# Patient Record
Sex: Male | Born: 1940
Health system: Southern US, Community
[De-identification: ages and names within clinical notes are randomized; demographics above are authoritative.]

## PROBLEM LIST (undated history)

## (undated) DIAGNOSIS — R112 Nausea with vomiting, unspecified: Secondary | ICD-10-CM

## (undated) DIAGNOSIS — I639 Cerebral infarction, unspecified: Secondary | ICD-10-CM

## (undated) DIAGNOSIS — I739 Peripheral vascular disease, unspecified: Secondary | ICD-10-CM

## (undated) DIAGNOSIS — C801 Malignant (primary) neoplasm, unspecified: Secondary | ICD-10-CM

## (undated) DIAGNOSIS — I219 Acute myocardial infarction, unspecified: Secondary | ICD-10-CM

## (undated) DIAGNOSIS — D649 Anemia, unspecified: Secondary | ICD-10-CM

## (undated) DIAGNOSIS — I2510 Atherosclerotic heart disease of native coronary artery without angina pectoris: Secondary | ICD-10-CM

## (undated) DIAGNOSIS — R053 Chronic cough: Secondary | ICD-10-CM

## (undated) DIAGNOSIS — E039 Hypothyroidism, unspecified: Secondary | ICD-10-CM

## (undated) DIAGNOSIS — I700 Atherosclerosis of aorta: Secondary | ICD-10-CM

## (undated) DIAGNOSIS — T8859XA Other complications of anesthesia, initial encounter: Secondary | ICD-10-CM

## (undated) DIAGNOSIS — I10 Essential (primary) hypertension: Secondary | ICD-10-CM

## (undated) DIAGNOSIS — R0600 Dyspnea, unspecified: Secondary | ICD-10-CM

## (undated) DIAGNOSIS — I714 Abdominal aortic aneurysm, without rupture, unspecified: Secondary | ICD-10-CM

## (undated) DIAGNOSIS — K5790 Diverticulosis of intestine, part unspecified, without perforation or abscess without bleeding: Secondary | ICD-10-CM

## (undated) DIAGNOSIS — K219 Gastro-esophageal reflux disease without esophagitis: Secondary | ICD-10-CM

## (undated) DIAGNOSIS — F419 Anxiety disorder, unspecified: Secondary | ICD-10-CM

## (undated) DIAGNOSIS — Z9889 Other specified postprocedural states: Secondary | ICD-10-CM

## (undated) DIAGNOSIS — G459 Transient cerebral ischemic attack, unspecified: Secondary | ICD-10-CM

## (undated) DIAGNOSIS — G4730 Sleep apnea, unspecified: Secondary | ICD-10-CM

## (undated) DIAGNOSIS — T4145XA Adverse effect of unspecified anesthetic, initial encounter: Secondary | ICD-10-CM

## (undated) DIAGNOSIS — M1990 Unspecified osteoarthritis, unspecified site: Secondary | ICD-10-CM

## (undated) DIAGNOSIS — J449 Chronic obstructive pulmonary disease, unspecified: Secondary | ICD-10-CM

## (undated) DIAGNOSIS — M4800 Spinal stenosis, site unspecified: Secondary | ICD-10-CM

## (undated) DIAGNOSIS — E7800 Pure hypercholesterolemia, unspecified: Secondary | ICD-10-CM

## (undated) DIAGNOSIS — M109 Gout, unspecified: Secondary | ICD-10-CM

## (undated) HISTORY — PX: BACK SURGERY: SHX140

## (undated) HISTORY — PX: CARDIAC CATHETERIZATION: SHX172

## (undated) HISTORY — PX: ABDOMINAL AORTIC ANEURYSM REPAIR: SUR1152

## (undated) HISTORY — PX: OTHER SURGICAL HISTORY: SHX169

## (undated) HISTORY — PX: TESTICLAR CYST EXCISION: SHX2492

## (undated) HISTORY — PX: EYE SURGERY: SHX253

---
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
## 1991-04-11 DIAGNOSIS — I219 Acute myocardial infarction, unspecified: Secondary | ICD-10-CM

## 1991-04-11 HISTORY — PX: CORONARY ARTERY BYPASS GRAFT: SHX141

## 1991-04-11 HISTORY — DX: Acute myocardial infarction, unspecified: I21.9

## 1991-09-09 DIAGNOSIS — Z951 Presence of aortocoronary bypass graft: Secondary | ICD-10-CM

## 1991-09-09 HISTORY — DX: Presence of aortocoronary bypass graft: Z95.1

## 1998-04-10 HISTORY — PX: BACK SURGERY: SHX140

## 2000-03-06 ENCOUNTER — Encounter: Payer: Self-pay | Admitting: Neurosurgery

## 2000-03-07 ENCOUNTER — Encounter: Payer: Self-pay | Admitting: Neurosurgery

## 2000-03-08 ENCOUNTER — Inpatient Hospital Stay (HOSPITAL_COMMUNITY): Admission: RE | Admit: 2000-03-08 | Discharge: 2000-03-09 | Payer: Self-pay | Admitting: Neurosurgery

## 2001-12-31 ENCOUNTER — Encounter: Payer: Self-pay | Admitting: Emergency Medicine

## 2001-12-31 ENCOUNTER — Inpatient Hospital Stay (HOSPITAL_COMMUNITY): Admission: EM | Admit: 2001-12-31 | Discharge: 2002-01-01 | Payer: Self-pay

## 2002-02-21 ENCOUNTER — Encounter: Payer: Self-pay | Admitting: Unknown Physician Specialty

## 2002-02-21 ENCOUNTER — Encounter: Admission: RE | Admit: 2002-02-21 | Discharge: 2002-02-21 | Payer: Self-pay | Admitting: *Deleted

## 2004-08-25 ENCOUNTER — Ambulatory Visit: Payer: Self-pay | Admitting: Unknown Physician Specialty

## 2004-11-17 ENCOUNTER — Other Ambulatory Visit: Payer: Self-pay

## 2004-11-17 ENCOUNTER — Emergency Department: Payer: Self-pay | Admitting: Unknown Physician Specialty

## 2006-06-17 ENCOUNTER — Other Ambulatory Visit: Payer: Self-pay

## 2006-06-17 ENCOUNTER — Emergency Department: Payer: Self-pay | Admitting: Emergency Medicine

## 2008-04-10 HISTORY — PX: ABDOMINAL AORTIC ANEURYSM REPAIR: SUR1152

## 2008-11-13 ENCOUNTER — Other Ambulatory Visit: Payer: Self-pay | Admitting: Unknown Physician Specialty

## 2009-01-07 ENCOUNTER — Ambulatory Visit: Payer: Self-pay | Admitting: Unknown Physician Specialty

## 2009-09-15 ENCOUNTER — Ambulatory Visit: Payer: Self-pay | Admitting: Urology

## 2009-09-29 ENCOUNTER — Ambulatory Visit: Payer: Self-pay | Admitting: Urology

## 2009-11-10 ENCOUNTER — Ambulatory Visit: Payer: Self-pay | Admitting: Urology

## 2009-12-22 ENCOUNTER — Ambulatory Visit: Payer: Self-pay | Admitting: Urology

## 2010-04-30 ENCOUNTER — Encounter: Payer: Self-pay | Admitting: Psychiatry

## 2010-05-11 ENCOUNTER — Ambulatory Visit: Payer: Self-pay | Admitting: Urology

## 2010-05-13 LAB — PATHOLOGY REPORT

## 2010-10-18 ENCOUNTER — Ambulatory Visit: Payer: Self-pay | Admitting: Otolaryngology

## 2010-10-26 ENCOUNTER — Ambulatory Visit: Payer: Self-pay | Admitting: Otolaryngology

## 2010-10-27 LAB — PATHOLOGY REPORT

## 2010-10-28 ENCOUNTER — Ambulatory Visit: Payer: Self-pay | Admitting: Otolaryngology

## 2010-11-02 ENCOUNTER — Ambulatory Visit: Payer: Self-pay | Admitting: Radiation Oncology

## 2010-11-09 ENCOUNTER — Ambulatory Visit: Payer: Self-pay | Admitting: Radiation Oncology

## 2010-12-10 ENCOUNTER — Ambulatory Visit: Payer: Self-pay | Admitting: Radiation Oncology

## 2011-01-09 ENCOUNTER — Ambulatory Visit: Payer: Self-pay | Admitting: Radiation Oncology

## 2011-01-19 ENCOUNTER — Emergency Department: Payer: Self-pay | Admitting: Emergency Medicine

## 2011-02-09 ENCOUNTER — Ambulatory Visit: Payer: Self-pay | Admitting: Radiation Oncology

## 2011-03-11 ENCOUNTER — Ambulatory Visit: Payer: Self-pay | Admitting: Radiation Oncology

## 2011-04-13 ENCOUNTER — Ambulatory Visit: Payer: Self-pay | Admitting: Radiation Oncology

## 2011-05-10 ENCOUNTER — Encounter: Payer: Self-pay | Admitting: Otolaryngology

## 2011-05-12 ENCOUNTER — Ambulatory Visit: Payer: Self-pay | Admitting: Radiation Oncology

## 2011-05-12 ENCOUNTER — Encounter: Payer: Self-pay | Admitting: Otolaryngology

## 2011-06-13 ENCOUNTER — Ambulatory Visit: Payer: Self-pay | Admitting: Radiation Oncology

## 2011-07-10 ENCOUNTER — Ambulatory Visit: Payer: Self-pay | Admitting: Radiation Oncology

## 2011-08-10 ENCOUNTER — Ambulatory Visit: Payer: Self-pay | Admitting: Radiation Oncology

## 2011-09-09 ENCOUNTER — Ambulatory Visit: Payer: Self-pay | Admitting: Radiation Oncology

## 2011-11-22 ENCOUNTER — Ambulatory Visit: Payer: Self-pay | Admitting: Internal Medicine

## 2011-12-06 ENCOUNTER — Ambulatory Visit: Payer: Self-pay | Admitting: Internal Medicine

## 2011-12-21 ENCOUNTER — Ambulatory Visit: Payer: Self-pay | Admitting: Radiation Oncology

## 2012-01-09 ENCOUNTER — Ambulatory Visit: Payer: Self-pay | Admitting: Radiation Oncology

## 2012-04-29 ENCOUNTER — Ambulatory Visit: Payer: Self-pay | Admitting: Unknown Physician Specialty

## 2012-06-19 ENCOUNTER — Ambulatory Visit: Payer: Self-pay | Admitting: Radiation Oncology

## 2012-07-09 ENCOUNTER — Ambulatory Visit: Payer: Self-pay | Admitting: Radiation Oncology

## 2013-04-10 DIAGNOSIS — C801 Malignant (primary) neoplasm, unspecified: Secondary | ICD-10-CM

## 2013-04-10 DIAGNOSIS — Z8521 Personal history of malignant neoplasm of larynx: Secondary | ICD-10-CM

## 2013-04-10 HISTORY — DX: Personal history of malignant neoplasm of larynx: Z85.21

## 2013-04-10 HISTORY — PX: LARYNGOSCOPY: SHX5203

## 2013-04-10 HISTORY — DX: Malignant (primary) neoplasm, unspecified: C80.1

## 2013-06-19 ENCOUNTER — Ambulatory Visit: Payer: Self-pay | Admitting: Radiation Oncology

## 2013-07-09 ENCOUNTER — Ambulatory Visit: Payer: Self-pay | Admitting: Radiation Oncology

## 2013-08-19 ENCOUNTER — Ambulatory Visit: Payer: Self-pay | Admitting: Physical Medicine and Rehabilitation

## 2013-08-25 DIAGNOSIS — M48062 Spinal stenosis, lumbar region with neurogenic claudication: Secondary | ICD-10-CM | POA: Insufficient documentation

## 2013-08-25 DIAGNOSIS — M5416 Radiculopathy, lumbar region: Secondary | ICD-10-CM | POA: Insufficient documentation

## 2013-08-25 DIAGNOSIS — M47816 Spondylosis without myelopathy or radiculopathy, lumbar region: Secondary | ICD-10-CM | POA: Insufficient documentation

## 2014-07-08 ENCOUNTER — Ambulatory Visit
Admit: 2014-07-08 | Disposition: A | Discharge status: Home / Self Care | Payer: Self-pay | Attending: Radiation Oncology | Admitting: Radiation Oncology

## 2015-03-07 DIAGNOSIS — D509 Iron deficiency anemia, unspecified: Secondary | ICD-10-CM | POA: Insufficient documentation

## 2015-07-29 DIAGNOSIS — I10 Essential (primary) hypertension: Secondary | ICD-10-CM | POA: Diagnosis not present

## 2015-07-29 DIAGNOSIS — E119 Type 2 diabetes mellitus without complications: Secondary | ICD-10-CM | POA: Diagnosis not present

## 2015-07-29 DIAGNOSIS — E538 Deficiency of other specified B group vitamins: Secondary | ICD-10-CM | POA: Diagnosis not present

## 2015-07-29 DIAGNOSIS — I739 Peripheral vascular disease, unspecified: Secondary | ICD-10-CM | POA: Diagnosis not present

## 2015-07-29 DIAGNOSIS — Z125 Encounter for screening for malignant neoplasm of prostate: Secondary | ICD-10-CM | POA: Diagnosis not present

## 2015-07-30 DIAGNOSIS — Z125 Encounter for screening for malignant neoplasm of prostate: Secondary | ICD-10-CM | POA: Diagnosis not present

## 2015-07-30 DIAGNOSIS — E538 Deficiency of other specified B group vitamins: Secondary | ICD-10-CM | POA: Diagnosis not present

## 2015-07-30 DIAGNOSIS — I10 Essential (primary) hypertension: Secondary | ICD-10-CM | POA: Diagnosis not present

## 2015-07-30 DIAGNOSIS — E119 Type 2 diabetes mellitus without complications: Secondary | ICD-10-CM | POA: Diagnosis not present

## 2015-07-30 DIAGNOSIS — I739 Peripheral vascular disease, unspecified: Secondary | ICD-10-CM | POA: Diagnosis not present

## 2015-08-02 DIAGNOSIS — R490 Dysphonia: Secondary | ICD-10-CM | POA: Diagnosis not present

## 2015-08-02 DIAGNOSIS — Z8521 Personal history of malignant neoplasm of larynx: Secondary | ICD-10-CM | POA: Diagnosis not present

## 2015-08-02 DIAGNOSIS — I890 Lymphedema, not elsewhere classified: Secondary | ICD-10-CM | POA: Diagnosis not present

## 2015-08-05 DIAGNOSIS — Z8521 Personal history of malignant neoplasm of larynx: Secondary | ICD-10-CM | POA: Insufficient documentation

## 2015-08-05 DIAGNOSIS — I739 Peripheral vascular disease, unspecified: Secondary | ICD-10-CM | POA: Diagnosis not present

## 2015-08-05 DIAGNOSIS — D508 Other iron deficiency anemias: Secondary | ICD-10-CM | POA: Diagnosis not present

## 2015-08-05 DIAGNOSIS — M47816 Spondylosis without myelopathy or radiculopathy, lumbar region: Secondary | ICD-10-CM | POA: Diagnosis not present

## 2015-08-05 DIAGNOSIS — I25710 Atherosclerosis of autologous vein coronary artery bypass graft(s) with unstable angina pectoris: Secondary | ICD-10-CM | POA: Diagnosis not present

## 2015-08-05 DIAGNOSIS — J439 Emphysema, unspecified: Secondary | ICD-10-CM | POA: Diagnosis not present

## 2015-08-05 DIAGNOSIS — G4733 Obstructive sleep apnea (adult) (pediatric): Secondary | ICD-10-CM | POA: Diagnosis not present

## 2015-08-05 DIAGNOSIS — E782 Mixed hyperlipidemia: Secondary | ICD-10-CM | POA: Diagnosis not present

## 2015-08-05 DIAGNOSIS — E119 Type 2 diabetes mellitus without complications: Secondary | ICD-10-CM | POA: Diagnosis not present

## 2015-08-05 DIAGNOSIS — M109 Gout, unspecified: Secondary | ICD-10-CM | POA: Diagnosis not present

## 2015-08-05 DIAGNOSIS — I10 Essential (primary) hypertension: Secondary | ICD-10-CM | POA: Diagnosis not present

## 2015-08-05 DIAGNOSIS — E538 Deficiency of other specified B group vitamins: Secondary | ICD-10-CM | POA: Diagnosis not present

## 2015-08-09 DIAGNOSIS — M898X9 Other specified disorders of bone, unspecified site: Secondary | ICD-10-CM | POA: Diagnosis not present

## 2015-08-09 DIAGNOSIS — B351 Tinea unguium: Secondary | ICD-10-CM | POA: Diagnosis not present

## 2015-08-09 DIAGNOSIS — E119 Type 2 diabetes mellitus without complications: Secondary | ICD-10-CM | POA: Diagnosis not present

## 2015-08-30 DIAGNOSIS — M5136 Other intervertebral disc degeneration, lumbar region: Secondary | ICD-10-CM | POA: Diagnosis not present

## 2015-08-30 DIAGNOSIS — M5416 Radiculopathy, lumbar region: Secondary | ICD-10-CM | POA: Diagnosis not present

## 2015-08-30 DIAGNOSIS — M4806 Spinal stenosis, lumbar region: Secondary | ICD-10-CM | POA: Diagnosis not present

## 2015-09-14 DIAGNOSIS — G4733 Obstructive sleep apnea (adult) (pediatric): Secondary | ICD-10-CM | POA: Diagnosis not present

## 2015-10-04 DIAGNOSIS — M5415 Radiculopathy, thoracolumbar region: Secondary | ICD-10-CM | POA: Diagnosis not present

## 2015-10-04 DIAGNOSIS — M5414 Radiculopathy, thoracic region: Secondary | ICD-10-CM | POA: Diagnosis not present

## 2015-10-04 DIAGNOSIS — M9902 Segmental and somatic dysfunction of thoracic region: Secondary | ICD-10-CM | POA: Diagnosis not present

## 2015-10-04 DIAGNOSIS — M546 Pain in thoracic spine: Secondary | ICD-10-CM | POA: Diagnosis not present

## 2015-10-04 DIAGNOSIS — M9908 Segmental and somatic dysfunction of rib cage: Secondary | ICD-10-CM | POA: Diagnosis not present

## 2015-10-04 DIAGNOSIS — M791 Myalgia: Secondary | ICD-10-CM | POA: Diagnosis not present

## 2015-10-06 DIAGNOSIS — M546 Pain in thoracic spine: Secondary | ICD-10-CM | POA: Diagnosis not present

## 2015-10-06 DIAGNOSIS — M9902 Segmental and somatic dysfunction of thoracic region: Secondary | ICD-10-CM | POA: Diagnosis not present

## 2015-10-06 DIAGNOSIS — M5415 Radiculopathy, thoracolumbar region: Secondary | ICD-10-CM | POA: Diagnosis not present

## 2015-10-06 DIAGNOSIS — M9908 Segmental and somatic dysfunction of rib cage: Secondary | ICD-10-CM | POA: Diagnosis not present

## 2015-10-06 DIAGNOSIS — M5414 Radiculopathy, thoracic region: Secondary | ICD-10-CM | POA: Diagnosis not present

## 2015-10-06 DIAGNOSIS — M791 Myalgia: Secondary | ICD-10-CM | POA: Diagnosis not present

## 2015-11-04 DIAGNOSIS — I719 Aortic aneurysm of unspecified site, without rupture: Secondary | ICD-10-CM | POA: Insufficient documentation

## 2015-11-11 DIAGNOSIS — M5417 Radiculopathy, lumbosacral region: Secondary | ICD-10-CM | POA: Diagnosis not present

## 2015-11-11 DIAGNOSIS — M25551 Pain in right hip: Secondary | ICD-10-CM | POA: Diagnosis not present

## 2015-11-11 DIAGNOSIS — M9904 Segmental and somatic dysfunction of sacral region: Secondary | ICD-10-CM | POA: Diagnosis not present

## 2015-11-11 DIAGNOSIS — M5416 Radiculopathy, lumbar region: Secondary | ICD-10-CM | POA: Diagnosis not present

## 2015-11-11 DIAGNOSIS — M9905 Segmental and somatic dysfunction of pelvic region: Secondary | ICD-10-CM | POA: Diagnosis not present

## 2015-11-11 DIAGNOSIS — M5136 Other intervertebral disc degeneration, lumbar region: Secondary | ICD-10-CM | POA: Diagnosis not present

## 2015-11-11 DIAGNOSIS — M5137 Other intervertebral disc degeneration, lumbosacral region: Secondary | ICD-10-CM | POA: Diagnosis not present

## 2015-11-11 DIAGNOSIS — M791 Myalgia: Secondary | ICD-10-CM | POA: Diagnosis not present

## 2015-11-11 DIAGNOSIS — M9903 Segmental and somatic dysfunction of lumbar region: Secondary | ICD-10-CM | POA: Diagnosis not present

## 2015-11-11 DIAGNOSIS — M7611 Psoas tendinitis, right hip: Secondary | ICD-10-CM | POA: Diagnosis not present

## 2015-11-12 DIAGNOSIS — M25551 Pain in right hip: Secondary | ICD-10-CM | POA: Diagnosis not present

## 2015-11-12 DIAGNOSIS — M5416 Radiculopathy, lumbar region: Secondary | ICD-10-CM | POA: Diagnosis not present

## 2015-11-12 DIAGNOSIS — M9904 Segmental and somatic dysfunction of sacral region: Secondary | ICD-10-CM | POA: Diagnosis not present

## 2015-11-12 DIAGNOSIS — M9905 Segmental and somatic dysfunction of pelvic region: Secondary | ICD-10-CM | POA: Diagnosis not present

## 2015-11-12 DIAGNOSIS — M791 Myalgia: Secondary | ICD-10-CM | POA: Diagnosis not present

## 2015-11-12 DIAGNOSIS — M5417 Radiculopathy, lumbosacral region: Secondary | ICD-10-CM | POA: Diagnosis not present

## 2015-11-12 DIAGNOSIS — M9903 Segmental and somatic dysfunction of lumbar region: Secondary | ICD-10-CM | POA: Diagnosis not present

## 2015-11-12 DIAGNOSIS — M7611 Psoas tendinitis, right hip: Secondary | ICD-10-CM | POA: Diagnosis not present

## 2015-11-12 DIAGNOSIS — M5136 Other intervertebral disc degeneration, lumbar region: Secondary | ICD-10-CM | POA: Diagnosis not present

## 2015-11-12 DIAGNOSIS — M5137 Other intervertebral disc degeneration, lumbosacral region: Secondary | ICD-10-CM | POA: Diagnosis not present

## 2015-11-30 DIAGNOSIS — M9903 Segmental and somatic dysfunction of lumbar region: Secondary | ICD-10-CM | POA: Diagnosis not present

## 2015-11-30 DIAGNOSIS — M5136 Other intervertebral disc degeneration, lumbar region: Secondary | ICD-10-CM | POA: Diagnosis not present

## 2015-11-30 DIAGNOSIS — M9905 Segmental and somatic dysfunction of pelvic region: Secondary | ICD-10-CM | POA: Diagnosis not present

## 2015-11-30 DIAGNOSIS — M5416 Radiculopathy, lumbar region: Secondary | ICD-10-CM | POA: Diagnosis not present

## 2015-11-30 DIAGNOSIS — M791 Myalgia: Secondary | ICD-10-CM | POA: Diagnosis not present

## 2015-11-30 DIAGNOSIS — M25551 Pain in right hip: Secondary | ICD-10-CM | POA: Diagnosis not present

## 2015-11-30 DIAGNOSIS — M7611 Psoas tendinitis, right hip: Secondary | ICD-10-CM | POA: Diagnosis not present

## 2015-11-30 DIAGNOSIS — M9904 Segmental and somatic dysfunction of sacral region: Secondary | ICD-10-CM | POA: Diagnosis not present

## 2015-11-30 DIAGNOSIS — M5137 Other intervertebral disc degeneration, lumbosacral region: Secondary | ICD-10-CM | POA: Diagnosis not present

## 2015-11-30 DIAGNOSIS — M5417 Radiculopathy, lumbosacral region: Secondary | ICD-10-CM | POA: Diagnosis not present

## 2015-12-02 DIAGNOSIS — M5136 Other intervertebral disc degeneration, lumbar region: Secondary | ICD-10-CM | POA: Diagnosis not present

## 2015-12-02 DIAGNOSIS — M9903 Segmental and somatic dysfunction of lumbar region: Secondary | ICD-10-CM | POA: Diagnosis not present

## 2015-12-02 DIAGNOSIS — M5137 Other intervertebral disc degeneration, lumbosacral region: Secondary | ICD-10-CM | POA: Diagnosis not present

## 2015-12-02 DIAGNOSIS — M791 Myalgia: Secondary | ICD-10-CM | POA: Diagnosis not present

## 2015-12-02 DIAGNOSIS — M5416 Radiculopathy, lumbar region: Secondary | ICD-10-CM | POA: Diagnosis not present

## 2015-12-02 DIAGNOSIS — M5417 Radiculopathy, lumbosacral region: Secondary | ICD-10-CM | POA: Diagnosis not present

## 2015-12-02 DIAGNOSIS — M9905 Segmental and somatic dysfunction of pelvic region: Secondary | ICD-10-CM | POA: Diagnosis not present

## 2015-12-02 DIAGNOSIS — M7611 Psoas tendinitis, right hip: Secondary | ICD-10-CM | POA: Diagnosis not present

## 2015-12-02 DIAGNOSIS — M9904 Segmental and somatic dysfunction of sacral region: Secondary | ICD-10-CM | POA: Diagnosis not present

## 2015-12-02 DIAGNOSIS — M25551 Pain in right hip: Secondary | ICD-10-CM | POA: Diagnosis not present

## 2015-12-06 DIAGNOSIS — M4806 Spinal stenosis, lumbar region: Secondary | ICD-10-CM | POA: Diagnosis not present

## 2015-12-06 DIAGNOSIS — M5136 Other intervertebral disc degeneration, lumbar region: Secondary | ICD-10-CM | POA: Diagnosis not present

## 2015-12-06 DIAGNOSIS — M5416 Radiculopathy, lumbar region: Secondary | ICD-10-CM | POA: Diagnosis not present

## 2015-12-06 DIAGNOSIS — M1611 Unilateral primary osteoarthritis, right hip: Secondary | ICD-10-CM | POA: Diagnosis not present

## 2015-12-24 DIAGNOSIS — M1611 Unilateral primary osteoarthritis, right hip: Secondary | ICD-10-CM | POA: Diagnosis not present

## 2015-12-27 DIAGNOSIS — G4733 Obstructive sleep apnea (adult) (pediatric): Secondary | ICD-10-CM | POA: Diagnosis not present

## 2016-01-10 DIAGNOSIS — Z09 Encounter for follow-up examination after completed treatment for conditions other than malignant neoplasm: Secondary | ICD-10-CM | POA: Diagnosis not present

## 2016-01-10 DIAGNOSIS — Z87891 Personal history of nicotine dependence: Secondary | ICD-10-CM | POA: Diagnosis not present

## 2016-01-10 DIAGNOSIS — I2510 Atherosclerotic heart disease of native coronary artery without angina pectoris: Secondary | ICD-10-CM | POA: Diagnosis not present

## 2016-01-10 DIAGNOSIS — I714 Abdominal aortic aneurysm, without rupture: Secondary | ICD-10-CM | POA: Diagnosis not present

## 2016-01-10 DIAGNOSIS — I10 Essential (primary) hypertension: Secondary | ICD-10-CM | POA: Diagnosis not present

## 2016-01-10 DIAGNOSIS — E785 Hyperlipidemia, unspecified: Secondary | ICD-10-CM | POA: Diagnosis not present

## 2016-01-10 DIAGNOSIS — I252 Old myocardial infarction: Secondary | ICD-10-CM | POA: Diagnosis not present

## 2016-01-18 DIAGNOSIS — H2513 Age-related nuclear cataract, bilateral: Secondary | ICD-10-CM | POA: Diagnosis not present

## 2016-01-31 DIAGNOSIS — M1611 Unilateral primary osteoarthritis, right hip: Secondary | ICD-10-CM | POA: Diagnosis not present

## 2016-01-31 DIAGNOSIS — M5416 Radiculopathy, lumbar region: Secondary | ICD-10-CM | POA: Diagnosis not present

## 2016-01-31 DIAGNOSIS — M48062 Spinal stenosis, lumbar region with neurogenic claudication: Secondary | ICD-10-CM | POA: Diagnosis not present

## 2016-01-31 DIAGNOSIS — M5136 Other intervertebral disc degeneration, lumbar region: Secondary | ICD-10-CM | POA: Diagnosis not present

## 2016-02-01 DIAGNOSIS — E119 Type 2 diabetes mellitus without complications: Secondary | ICD-10-CM | POA: Diagnosis not present

## 2016-02-01 DIAGNOSIS — M109 Gout, unspecified: Secondary | ICD-10-CM | POA: Diagnosis not present

## 2016-02-01 DIAGNOSIS — J439 Emphysema, unspecified: Secondary | ICD-10-CM | POA: Diagnosis not present

## 2016-02-01 DIAGNOSIS — I25710 Atherosclerosis of autologous vein coronary artery bypass graft(s) with unstable angina pectoris: Secondary | ICD-10-CM | POA: Diagnosis not present

## 2016-02-01 DIAGNOSIS — I10 Essential (primary) hypertension: Secondary | ICD-10-CM | POA: Diagnosis not present

## 2016-02-02 DIAGNOSIS — E119 Type 2 diabetes mellitus without complications: Secondary | ICD-10-CM | POA: Diagnosis not present

## 2016-02-02 DIAGNOSIS — M109 Gout, unspecified: Secondary | ICD-10-CM | POA: Diagnosis not present

## 2016-02-02 DIAGNOSIS — I25710 Atherosclerosis of autologous vein coronary artery bypass graft(s) with unstable angina pectoris: Secondary | ICD-10-CM | POA: Diagnosis not present

## 2016-02-02 DIAGNOSIS — J439 Emphysema, unspecified: Secondary | ICD-10-CM | POA: Diagnosis not present

## 2016-02-02 DIAGNOSIS — I10 Essential (primary) hypertension: Secondary | ICD-10-CM | POA: Diagnosis not present

## 2016-02-07 DIAGNOSIS — I714 Abdominal aortic aneurysm, without rupture, unspecified: Secondary | ICD-10-CM | POA: Insufficient documentation

## 2016-02-08 DIAGNOSIS — M48062 Spinal stenosis, lumbar region with neurogenic claudication: Secondary | ICD-10-CM | POA: Diagnosis not present

## 2016-02-08 DIAGNOSIS — I25710 Atherosclerosis of autologous vein coronary artery bypass graft(s) with unstable angina pectoris: Secondary | ICD-10-CM | POA: Diagnosis not present

## 2016-02-08 DIAGNOSIS — G4733 Obstructive sleep apnea (adult) (pediatric): Secondary | ICD-10-CM | POA: Diagnosis not present

## 2016-02-08 DIAGNOSIS — E119 Type 2 diabetes mellitus without complications: Secondary | ICD-10-CM | POA: Diagnosis not present

## 2016-02-08 DIAGNOSIS — E782 Mixed hyperlipidemia: Secondary | ICD-10-CM | POA: Diagnosis not present

## 2016-02-08 DIAGNOSIS — I739 Peripheral vascular disease, unspecified: Secondary | ICD-10-CM | POA: Diagnosis not present

## 2016-02-08 DIAGNOSIS — D692 Other nonthrombocytopenic purpura: Secondary | ICD-10-CM | POA: Insufficient documentation

## 2016-02-08 DIAGNOSIS — M109 Gout, unspecified: Secondary | ICD-10-CM | POA: Diagnosis not present

## 2016-02-08 DIAGNOSIS — E538 Deficiency of other specified B group vitamins: Secondary | ICD-10-CM | POA: Diagnosis not present

## 2016-02-08 DIAGNOSIS — I10 Essential (primary) hypertension: Secondary | ICD-10-CM | POA: Diagnosis not present

## 2016-02-08 DIAGNOSIS — Z0001 Encounter for general adult medical examination with abnormal findings: Secondary | ICD-10-CM | POA: Diagnosis not present

## 2016-02-08 DIAGNOSIS — J439 Emphysema, unspecified: Secondary | ICD-10-CM | POA: Diagnosis not present

## 2016-02-08 DIAGNOSIS — Z23 Encounter for immunization: Secondary | ICD-10-CM | POA: Diagnosis not present

## 2016-02-08 DIAGNOSIS — K219 Gastro-esophageal reflux disease without esophagitis: Secondary | ICD-10-CM | POA: Diagnosis not present

## 2016-02-09 DIAGNOSIS — R808 Other proteinuria: Secondary | ICD-10-CM | POA: Diagnosis not present

## 2016-02-17 DIAGNOSIS — M5416 Radiculopathy, lumbar region: Secondary | ICD-10-CM | POA: Diagnosis not present

## 2016-02-17 DIAGNOSIS — M48062 Spinal stenosis, lumbar region with neurogenic claudication: Secondary | ICD-10-CM | POA: Diagnosis not present

## 2016-02-17 DIAGNOSIS — M5136 Other intervertebral disc degeneration, lumbar region: Secondary | ICD-10-CM | POA: Diagnosis not present

## 2016-03-16 DIAGNOSIS — M5416 Radiculopathy, lumbar region: Secondary | ICD-10-CM | POA: Diagnosis not present

## 2016-03-16 DIAGNOSIS — M5136 Other intervertebral disc degeneration, lumbar region: Secondary | ICD-10-CM | POA: Diagnosis not present

## 2016-03-16 DIAGNOSIS — M48062 Spinal stenosis, lumbar region with neurogenic claudication: Secondary | ICD-10-CM | POA: Diagnosis not present

## 2016-03-30 DIAGNOSIS — G4733 Obstructive sleep apnea (adult) (pediatric): Secondary | ICD-10-CM | POA: Diagnosis not present

## 2016-04-07 DIAGNOSIS — G4733 Obstructive sleep apnea (adult) (pediatric): Secondary | ICD-10-CM | POA: Diagnosis not present

## 2016-04-12 DIAGNOSIS — M1611 Unilateral primary osteoarthritis, right hip: Secondary | ICD-10-CM | POA: Diagnosis not present

## 2016-05-04 DIAGNOSIS — E119 Type 2 diabetes mellitus without complications: Secondary | ICD-10-CM | POA: Diagnosis not present

## 2016-05-04 DIAGNOSIS — E538 Deficiency of other specified B group vitamins: Secondary | ICD-10-CM | POA: Diagnosis not present

## 2016-05-04 DIAGNOSIS — E782 Mixed hyperlipidemia: Secondary | ICD-10-CM | POA: Diagnosis not present

## 2016-05-10 DIAGNOSIS — M47816 Spondylosis without myelopathy or radiculopathy, lumbar region: Secondary | ICD-10-CM | POA: Diagnosis not present

## 2016-05-10 DIAGNOSIS — E782 Mixed hyperlipidemia: Secondary | ICD-10-CM | POA: Diagnosis not present

## 2016-05-10 DIAGNOSIS — D692 Other nonthrombocytopenic purpura: Secondary | ICD-10-CM | POA: Diagnosis not present

## 2016-05-10 DIAGNOSIS — M109 Gout, unspecified: Secondary | ICD-10-CM | POA: Diagnosis not present

## 2016-05-10 DIAGNOSIS — I739 Peripheral vascular disease, unspecified: Secondary | ICD-10-CM | POA: Diagnosis not present

## 2016-05-10 DIAGNOSIS — I714 Abdominal aortic aneurysm, without rupture: Secondary | ICD-10-CM | POA: Diagnosis not present

## 2016-05-10 DIAGNOSIS — I10 Essential (primary) hypertension: Secondary | ICD-10-CM | POA: Diagnosis not present

## 2016-05-10 DIAGNOSIS — E119 Type 2 diabetes mellitus without complications: Secondary | ICD-10-CM | POA: Diagnosis not present

## 2016-05-10 DIAGNOSIS — I25710 Atherosclerosis of autologous vein coronary artery bypass graft(s) with unstable angina pectoris: Secondary | ICD-10-CM | POA: Diagnosis not present

## 2016-05-10 DIAGNOSIS — K219 Gastro-esophageal reflux disease without esophagitis: Secondary | ICD-10-CM | POA: Diagnosis not present

## 2016-05-10 DIAGNOSIS — M48062 Spinal stenosis, lumbar region with neurogenic claudication: Secondary | ICD-10-CM | POA: Diagnosis not present

## 2016-05-10 DIAGNOSIS — J439 Emphysema, unspecified: Secondary | ICD-10-CM | POA: Diagnosis not present

## 2016-06-06 DIAGNOSIS — M1611 Unilateral primary osteoarthritis, right hip: Secondary | ICD-10-CM | POA: Diagnosis not present

## 2016-06-06 DIAGNOSIS — M5416 Radiculopathy, lumbar region: Secondary | ICD-10-CM | POA: Diagnosis not present

## 2016-06-06 DIAGNOSIS — M48062 Spinal stenosis, lumbar region with neurogenic claudication: Secondary | ICD-10-CM | POA: Diagnosis not present

## 2016-06-06 DIAGNOSIS — M5136 Other intervertebral disc degeneration, lumbar region: Secondary | ICD-10-CM | POA: Diagnosis not present

## 2016-06-21 DIAGNOSIS — Z8521 Personal history of malignant neoplasm of larynx: Secondary | ICD-10-CM | POA: Diagnosis not present

## 2016-06-21 DIAGNOSIS — Z08 Encounter for follow-up examination after completed treatment for malignant neoplasm: Secondary | ICD-10-CM | POA: Diagnosis not present

## 2016-06-21 DIAGNOSIS — Z87891 Personal history of nicotine dependence: Secondary | ICD-10-CM | POA: Diagnosis not present

## 2016-06-21 DIAGNOSIS — K219 Gastro-esophageal reflux disease without esophagitis: Secondary | ICD-10-CM | POA: Diagnosis not present

## 2016-06-21 DIAGNOSIS — J018 Other acute sinusitis: Secondary | ICD-10-CM | POA: Diagnosis not present

## 2016-07-12 DIAGNOSIS — G4733 Obstructive sleep apnea (adult) (pediatric): Secondary | ICD-10-CM | POA: Diagnosis not present

## 2016-07-20 DIAGNOSIS — J330 Polyp of nasal cavity: Secondary | ICD-10-CM | POA: Diagnosis not present

## 2016-07-20 DIAGNOSIS — Z87891 Personal history of nicotine dependence: Secondary | ICD-10-CM | POA: Diagnosis not present

## 2016-08-03 DIAGNOSIS — M109 Gout, unspecified: Secondary | ICD-10-CM | POA: Diagnosis not present

## 2016-08-03 DIAGNOSIS — E119 Type 2 diabetes mellitus without complications: Secondary | ICD-10-CM | POA: Diagnosis not present

## 2016-08-03 DIAGNOSIS — I10 Essential (primary) hypertension: Secondary | ICD-10-CM | POA: Diagnosis not present

## 2016-08-03 DIAGNOSIS — E538 Deficiency of other specified B group vitamins: Secondary | ICD-10-CM | POA: Diagnosis not present

## 2016-08-08 DIAGNOSIS — M48062 Spinal stenosis, lumbar region with neurogenic claudication: Secondary | ICD-10-CM | POA: Diagnosis not present

## 2016-08-08 DIAGNOSIS — D692 Other nonthrombocytopenic purpura: Secondary | ICD-10-CM | POA: Diagnosis not present

## 2016-08-08 DIAGNOSIS — E782 Mixed hyperlipidemia: Secondary | ICD-10-CM | POA: Diagnosis not present

## 2016-08-08 DIAGNOSIS — I10 Essential (primary) hypertension: Secondary | ICD-10-CM | POA: Diagnosis not present

## 2016-08-08 DIAGNOSIS — Z8521 Personal history of malignant neoplasm of larynx: Secondary | ICD-10-CM | POA: Diagnosis not present

## 2016-08-08 DIAGNOSIS — M109 Gout, unspecified: Secondary | ICD-10-CM | POA: Diagnosis not present

## 2016-08-08 DIAGNOSIS — E538 Deficiency of other specified B group vitamins: Secondary | ICD-10-CM | POA: Diagnosis not present

## 2016-08-08 DIAGNOSIS — I739 Peripheral vascular disease, unspecified: Secondary | ICD-10-CM | POA: Diagnosis not present

## 2016-08-08 DIAGNOSIS — K219 Gastro-esophageal reflux disease without esophagitis: Secondary | ICD-10-CM | POA: Diagnosis not present

## 2016-08-08 DIAGNOSIS — G4733 Obstructive sleep apnea (adult) (pediatric): Secondary | ICD-10-CM | POA: Diagnosis not present

## 2016-08-08 DIAGNOSIS — E119 Type 2 diabetes mellitus without complications: Secondary | ICD-10-CM | POA: Diagnosis not present

## 2016-08-08 DIAGNOSIS — Z0000 Encounter for general adult medical examination without abnormal findings: Secondary | ICD-10-CM | POA: Diagnosis not present

## 2016-10-18 DIAGNOSIS — J018 Other acute sinusitis: Secondary | ICD-10-CM | POA: Diagnosis not present

## 2016-11-06 DIAGNOSIS — H2511 Age-related nuclear cataract, right eye: Secondary | ICD-10-CM | POA: Diagnosis not present

## 2016-11-27 DIAGNOSIS — H2512 Age-related nuclear cataract, left eye: Secondary | ICD-10-CM | POA: Diagnosis not present

## 2016-12-12 ENCOUNTER — Ambulatory Visit
Admission: RE | Admit: 2016-12-12 | Discharge: 2016-12-12 | Disposition: A | Discharge status: Home / Self Care | Payer: PPO | Source: Ambulatory Visit | Attending: Ophthalmology | Admitting: Ophthalmology

## 2016-12-12 ENCOUNTER — Ambulatory Visit: Payer: PPO | Admitting: Anesthesiology

## 2016-12-12 ENCOUNTER — Encounter
Admission: RE | Disposition: A | Discharge status: Home / Self Care | Payer: Self-pay | Source: Ambulatory Visit | Attending: Ophthalmology

## 2016-12-12 ENCOUNTER — Encounter: Payer: Self-pay | Admitting: *Deleted

## 2016-12-12 DIAGNOSIS — Z87891 Personal history of nicotine dependence: Secondary | ICD-10-CM | POA: Diagnosis not present

## 2016-12-12 DIAGNOSIS — R609 Edema, unspecified: Secondary | ICD-10-CM | POA: Diagnosis not present

## 2016-12-12 DIAGNOSIS — E039 Hypothyroidism, unspecified: Secondary | ICD-10-CM | POA: Insufficient documentation

## 2016-12-12 DIAGNOSIS — I252 Old myocardial infarction: Secondary | ICD-10-CM | POA: Diagnosis not present

## 2016-12-12 DIAGNOSIS — Z8673 Personal history of transient ischemic attack (TIA), and cerebral infarction without residual deficits: Secondary | ICD-10-CM | POA: Insufficient documentation

## 2016-12-12 DIAGNOSIS — M109 Gout, unspecified: Secondary | ICD-10-CM | POA: Insufficient documentation

## 2016-12-12 DIAGNOSIS — D649 Anemia, unspecified: Secondary | ICD-10-CM | POA: Insufficient documentation

## 2016-12-12 DIAGNOSIS — Z951 Presence of aortocoronary bypass graft: Secondary | ICD-10-CM | POA: Diagnosis not present

## 2016-12-12 DIAGNOSIS — E7800 Pure hypercholesterolemia, unspecified: Secondary | ICD-10-CM | POA: Diagnosis not present

## 2016-12-12 DIAGNOSIS — I10 Essential (primary) hypertension: Secondary | ICD-10-CM | POA: Insufficient documentation

## 2016-12-12 DIAGNOSIS — Z8589 Personal history of malignant neoplasm of other organs and systems: Secondary | ICD-10-CM | POA: Insufficient documentation

## 2016-12-12 DIAGNOSIS — H2512 Age-related nuclear cataract, left eye: Secondary | ICD-10-CM | POA: Diagnosis not present

## 2016-12-12 DIAGNOSIS — G4730 Sleep apnea, unspecified: Secondary | ICD-10-CM | POA: Diagnosis not present

## 2016-12-12 DIAGNOSIS — K219 Gastro-esophageal reflux disease without esophagitis: Secondary | ICD-10-CM | POA: Diagnosis not present

## 2016-12-12 HISTORY — DX: Malignant (primary) neoplasm, unspecified: C80.1

## 2016-12-12 HISTORY — DX: Acute myocardial infarction, unspecified: I21.9

## 2016-12-12 HISTORY — DX: Transient cerebral ischemic attack, unspecified: G45.9

## 2016-12-12 HISTORY — DX: Essential (primary) hypertension: I10

## 2016-12-12 HISTORY — DX: Gastro-esophageal reflux disease without esophagitis: K21.9

## 2016-12-12 HISTORY — DX: Sleep apnea, unspecified: G47.30

## 2016-12-12 HISTORY — DX: Gout, unspecified: M10.9

## 2016-12-12 HISTORY — PX: CATARACT EXTRACTION W/PHACO: SHX586

## 2016-12-12 HISTORY — DX: Hypothyroidism, unspecified: E03.9

## 2016-12-12 HISTORY — DX: Pure hypercholesterolemia, unspecified: E78.00

## 2016-12-12 HISTORY — DX: Anemia, unspecified: D64.9

## 2016-12-12 SURGERY — PHACOEMULSIFICATION, CATARACT, WITH IOL INSERTION
Anesthesia: Monitor Anesthesia Care | Site: Eye | Laterality: Left | Wound class: Clean

## 2016-12-12 MED ORDER — CARBACHOL 0.01 % IO SOLN
INTRAOCULAR | Status: DC | PRN
  Administered 2016-12-12: 0.5 mL via INTRAOCULAR

## 2016-12-12 MED ORDER — EPINEPHRINE PF 1 MG/ML IJ SOLN
INTRAMUSCULAR | Status: AC
Start: 2016-12-12 — End: ?
  Filled 2016-12-12: qty 2

## 2016-12-12 MED ORDER — FENTANYL CITRATE (PF) 100 MCG/2ML IJ SOLN
INTRAMUSCULAR | Status: DC | PRN
Start: 2016-12-12 — End: 2016-12-12
  Administered 2016-12-12 (×2): 25 ug via INTRAVENOUS

## 2016-12-12 MED ORDER — MOXIFLOXACIN HCL 0.5 % OP SOLN
1.0000 [drp] | OPHTHALMIC | Status: AC | PRN
  Administered 2016-12-12: 1 [drp] via OPHTHALMIC
  Filled 2016-12-12: qty 3

## 2016-12-12 MED ORDER — POVIDONE-IODINE 5 % OP SOLN
OPHTHALMIC | Status: AC
Start: 2016-12-12 — End: ?
  Filled 2016-12-12: qty 30

## 2016-12-12 MED ORDER — MOXIFLOXACIN HCL 0.5 % OP SOLN
OPHTHALMIC | Status: DC | PRN
  Administered 2016-12-12: 0.2 mL via OPHTHALMIC

## 2016-12-12 MED ORDER — POVIDONE-IODINE 5 % OP SOLN
OPHTHALMIC | Status: DC | PRN
  Administered 2016-12-12: 1 via OPHTHALMIC

## 2016-12-12 MED ORDER — MOXIFLOXACIN HCL 0.5 % OP SOLN
OPHTHALMIC | Status: AC
  Filled 2016-12-12: qty 3

## 2016-12-12 MED ORDER — FENTANYL CITRATE (PF) 100 MCG/2ML IJ SOLN
INTRAMUSCULAR | Status: AC
  Filled 2016-12-12: qty 2

## 2016-12-12 MED ORDER — ARMC OPHTHALMIC DILATING DROPS
OPHTHALMIC | Status: AC
  Administered 2016-12-12: 1 via OPHTHALMIC
  Filled 2016-12-12: qty 0.4

## 2016-12-12 MED ORDER — LIDOCAINE HCL (PF) 4 % IJ SOLN
INTRAMUSCULAR | Status: AC
  Filled 2016-12-12: qty 5

## 2016-12-12 MED ORDER — SODIUM CHLORIDE 0.9 % IV SOLN
INTRAVENOUS | Status: DC
  Administered 2016-12-12: 08:00:00 via INTRAVENOUS

## 2016-12-12 MED ORDER — NA CHONDROIT SULF-NA HYALURON 40-17 MG/ML IO SOLN
INTRAOCULAR | Status: DC | PRN
  Administered 2016-12-12: 1 mL via INTRAOCULAR

## 2016-12-12 MED ORDER — EPINEPHRINE PF 1 MG/ML IJ SOLN
INTRAOCULAR | Status: DC | PRN
  Administered 2016-12-12: 09:00:00 via OPHTHALMIC

## 2016-12-12 MED ORDER — NA CHONDROIT SULF-NA HYALURON 40-17 MG/ML IO SOLN
INTRAOCULAR | Status: AC
  Filled 2016-12-12: qty 1

## 2016-12-12 MED ORDER — ARMC OPHTHALMIC DILATING DROPS
1.0000 "application " | OPHTHALMIC | Status: AC | PRN
  Administered 2016-12-12 (×3): 1 via OPHTHALMIC

## 2016-12-12 MED ORDER — LIDOCAINE HCL (PF) 4 % IJ SOLN
INTRAOCULAR | Status: DC | PRN
  Administered 2016-12-12: 4 mL via OPHTHALMIC

## 2016-12-12 SURGICAL SUPPLY — 18 items
CANNULA ANT/CHMB 27G (MISCELLANEOUS) IMPLANT
CANNULA ANT/CHMB 27GA (MISCELLANEOUS) ×2 IMPLANT
GLOVE BIO SURGEON STRL SZ8 (GLOVE) ×2 IMPLANT
GLOVE BIOGEL M 6.5 STRL (GLOVE) ×2 IMPLANT
GLOVE SURG LX 8.0 MICRO (GLOVE) ×1
GLOVE SURG LX STRL 8.0 MICRO (GLOVE) ×1 IMPLANT
GOWN STRL REUS W/ TWL LRG LVL3 (GOWN DISPOSABLE) ×2 IMPLANT
GOWN STRL REUS W/TWL LRG LVL3 (GOWN DISPOSABLE) ×4
LABEL CATARACT MEDS ST (LABEL) ×2 IMPLANT
LENS IOL TECNIS ITEC 13.5 (Intraocular Lens) ×1 IMPLANT
PACK CATARACT (MISCELLANEOUS) ×2 IMPLANT
PACK CATARACT BRASINGTON LX (MISCELLANEOUS) ×2 IMPLANT
PACK EYE AFTER SURG (MISCELLANEOUS) ×2 IMPLANT
SOL BSS BAG (MISCELLANEOUS) ×2
SOLUTION BSS BAG (MISCELLANEOUS) ×1 IMPLANT
SYR 5ML LL (SYRINGE) ×2 IMPLANT
WATER STERILE IRR 250ML POUR (IV SOLUTION) ×2 IMPLANT
WIPE NON LINTING 3.25X3.25 (MISCELLANEOUS) ×2 IMPLANT

## 2016-12-12 NOTE — Anesthesia Post-op Follow-up Note (Signed)
Anesthesia QCDR form completed.        

## 2016-12-12 NOTE — Anesthesia Postprocedure Evaluation (Signed)
Anesthesia Post Note  Patient: Jesse Allison  Procedure(s) Performed: Procedure(s) (LRB): CATARACT EXTRACTION PHACO AND INTRAOCULAR LENS PLACEMENT (IOC) (Left)  Patient location during evaluation: Short Stay Anesthesia Type: MAC Level of consciousness: awake and alert and oriented Pain management: pain level controlled Vital Signs Assessment: post-procedure vital signs reviewed and stable Respiratory status: spontaneous breathing and respiratory function stable Cardiovascular status: stable Postop Assessment: no headache and adequate PO intake Anesthetic complications: no     Last Vitals:  Vitals:   12/12/16 0921 12/12/16 0922  BP: (!) 184/71 (!) 184/71  Pulse: (!) 57 62  Resp: 18 18  Temp: (!) 36.2 C   SpO2: 100% 100%    Last Pain:  Vitals:   12/12/16 0921  TempSrc: Filbert Berthold

## 2016-12-12 NOTE — Anesthesia Preprocedure Evaluation (Signed)
Anesthesia Evaluation  Patient identified by MRN, date of birth, ID band Patient awake    Reviewed: Allergy & Precautions, NPO status , Patient's Chart, lab work & pertinent test results, reviewed documented beta blocker date and time   Airway Mallampati: III  TM Distance: >3 FB     Dental  (+) Chipped   Pulmonary sleep apnea , former smoker,           Cardiovascular hypertension, Pt. on medications + Past MI       Neuro/Psych TIA   GI/Hepatic GERD  Controlled,  Endo/Other  Hypothyroidism   Renal/GU      Musculoskeletal   Abdominal   Peds  Hematology  (+) anemia ,   Anesthesia Other Findings Gout.AAA repair.  Reproductive/Obstetrics                             Anesthesia Physical Anesthesia Plan  ASA: III  Anesthesia Plan: MAC   Post-op Pain Management:    Induction:   PONV Risk Score and Plan:   Airway Management Planned:   Additional Equipment:   Intra-op Plan:   Post-operative Plan:   Informed Consent: I have reviewed the patients History and Physical, chart, labs and discussed the procedure including the risks, benefits and alternatives for the proposed anesthesia with the patient or authorized representative who has indicated his/her understanding and acceptance.     Plan Discussed with: CRNA  Anesthesia Plan Comments:         Anesthesia Quick Evaluation

## 2016-12-12 NOTE — Addendum Note (Signed)
Addendum  created 12/12/16 1030 by Gunnar Bulla, MD   Anesthesia Attestations filed

## 2016-12-12 NOTE — Discharge Instructions (Signed)
FOLLOW DR. PORFILIO'S POSTOP DISCHARGE INSTRUCTIONS AS REVIEWED.  Eye Surgery Discharge Instructions  Expect mild scratchy sensation or mild soreness. DO NOT RUB YOUR EYE!  The day of surgery:  Minimal physical activity, but bed rest is not required  No reading, computer work, or close hand work  No bending, lifting, or straining.  May watch TV  For 24 hours:  No driving, legal decisions, or alcoholic beverages  Safety precautions  Eat anything you prefer: It is better to start with liquids, then soup then solid foods.  _____ Eye patch should be worn until postoperative exam tomorrow.  ____ Solar shield eyeglasses should be worn for comfort in the sunlight/patch while sleeping  Resume all regular medications including aspirin or Coumadin if these were discontinued prior to surgery. You may shower, bathe, shave, or wash your hair. Tylenol may be taken for mild discomfort.  Call your doctor if you experience significant pain, nausea, or vomiting, fever > 101 or other signs of infection. [phone] or [phone] Specific instructions:  Follow-up Information    Birder Robson, MD Follow up.   Specialty:  Ophthalmology Why:  12/13/16 @ 9:35 am Contact information: [address] [place] [id number] [phone]

## 2016-12-12 NOTE — Transfer of Care (Signed)
Immediate Anesthesia Transfer of Care Note  Patient: Jesse Allison  Procedure(s) Performed: Procedure(s) with comments: CATARACT EXTRACTION PHACO AND INTRAOCULAR LENS PLACEMENT (IOC) (Left) - [place] 00:38.1 AP% 14.1 CDE 5.38 FLUID PACK LOT # [id number]  Patient Location: PACU and Short Stay  Anesthesia Type:MAC  Level of Consciousness: awake, alert  and oriented  Airway & Oxygen Therapy: Patient Spontanous Breathing  Post-op Assessment: Report given to RN and Post -op Vital signs reviewed and stable  Post vital signs: Reviewed and stable  Last Vitals:  Vitals:   12/12/16 0921 12/12/16 0922  BP: (!) 184/71 (!) 184/71  Pulse: (!) 57 62  Resp: 18   Temp: (!) 36.2 C   SpO2: 100% 100%    Last Pain:  Vitals:   12/12/16 0921  TempSrc: Temporal         Complications: No apparent anesthesia complications

## 2016-12-12 NOTE — Op Note (Signed)
PREOPERATIVE DIAGNOSIS:  Nuclear sclerotic cataract of the left eye.   POSTOPERATIVE DIAGNOSIS:  Nuclear sclerotic cataract of the left eye.   OPERATIVE PROCEDURE: Procedure(s): CATARACT EXTRACTION PHACO AND INTRAOCULAR LENS PLACEMENT (IOC)   SURGEON:  Birder Robson, MD.   ANESTHESIA:  Anesthesiologist: Gunnar Bulla, MD CRNA: Jonna Clark, CRNA  1.      Managed anesthesia care. 2.     0.73ml of Shugarcaine was instilled following the paracentesis   COMPLICATIONS:  None.   TECHNIQUE:   Stop and chop   DESCRIPTION OF PROCEDURE:  The patient was examined and consented in the preoperative holding area where the aforementioned topical anesthesia was applied to the left eye and then brought back to the Operating Room where the left eye was prepped and draped in the usual sterile ophthalmic fashion and a lid speculum was placed. A paracentesis was created with the side port blade and the anterior chamber was filled with viscoelastic. A near clear corneal incision was performed with the steel keratome. A continuous curvilinear capsulorrhexis was performed with a cystotome followed by the capsulorrhexis forceps. Hydrodissection and hydrodelineation were carried out with BSS on a blunt cannula. The lens was removed in a stop and chop  technique and the remaining cortical material was removed with the irrigation-aspiration handpiece. The capsular bag was inflated with viscoelastic and the Technis ZCB00 lens was placed in the capsular bag without complication. The remaining viscoelastic was removed from the eye with the irrigation-aspiration handpiece. The wounds were hydrated. The anterior chamber was flushed with Miostat and the eye was inflated to physiologic pressure. 0.45ml Vigamox was placed in the anterior chamber. The wounds were found to be water tight. The eye was dressed with Vigamox. The patient was given protective glasses to wear throughout the day and a shield with which to sleep  tonight. The patient was also given drops with which to begin a drop regimen today and will follow-up with me in one day.  Implant Name Type Inv. Item Serial No. Manufacturer Lot No. LRB No. Used  LENS IOL DIOP 13.5 - [id number] Intraocular Lens LENS IOL DIOP 13.5 [id number]   Left 1    Procedure(s) with comments: CATARACT EXTRACTION PHACO AND INTRAOCULAR LENS PLACEMENT (IOC) (Left) - [place] 00:38.1 AP% 14.1 CDE 5.38 FLUID PACK LOT # [id number]  Electronically signed: Mistee Soliman LOUIS 12/12/2016 9:20 AM

## 2016-12-12 NOTE — H&P (Signed)
All labs reviewed. Abnormal studies sent to patients PCP when indicated.  Previous H&P reviewed, patient examined, there are NO CHANGES.  Maksymilian Mabey LOUIS9/4/20188:56 AM

## 2016-12-13 ENCOUNTER — Encounter: Payer: Self-pay | Admitting: Ophthalmology

## 2016-12-20 DIAGNOSIS — H2511 Age-related nuclear cataract, right eye: Secondary | ICD-10-CM | POA: Diagnosis not present

## 2016-12-21 ENCOUNTER — Encounter: Payer: Self-pay | Admitting: *Deleted

## 2016-12-21 DIAGNOSIS — M1611 Unilateral primary osteoarthritis, right hip: Secondary | ICD-10-CM | POA: Diagnosis not present

## 2016-12-26 ENCOUNTER — Ambulatory Visit: Payer: PPO | Admitting: Certified Registered Nurse Anesthetist

## 2016-12-26 ENCOUNTER — Encounter
Admission: RE | Disposition: A | Discharge status: Home / Self Care | Payer: Self-pay | Source: Ambulatory Visit | Attending: Ophthalmology

## 2016-12-26 ENCOUNTER — Ambulatory Visit
Admission: RE | Admit: 2016-12-26 | Discharge: 2016-12-26 | Disposition: A | Discharge status: Home / Self Care | Payer: PPO | Source: Ambulatory Visit | Attending: Ophthalmology | Admitting: Ophthalmology

## 2016-12-26 DIAGNOSIS — I2510 Atherosclerotic heart disease of native coronary artery without angina pectoris: Secondary | ICD-10-CM | POA: Diagnosis not present

## 2016-12-26 DIAGNOSIS — E7800 Pure hypercholesterolemia, unspecified: Secondary | ICD-10-CM | POA: Insufficient documentation

## 2016-12-26 DIAGNOSIS — I10 Essential (primary) hypertension: Secondary | ICD-10-CM | POA: Insufficient documentation

## 2016-12-26 DIAGNOSIS — G4730 Sleep apnea, unspecified: Secondary | ICD-10-CM | POA: Diagnosis not present

## 2016-12-26 DIAGNOSIS — Z79899 Other long term (current) drug therapy: Secondary | ICD-10-CM | POA: Insufficient documentation

## 2016-12-26 DIAGNOSIS — Z9842 Cataract extraction status, left eye: Secondary | ICD-10-CM | POA: Diagnosis not present

## 2016-12-26 DIAGNOSIS — H2511 Age-related nuclear cataract, right eye: Secondary | ICD-10-CM | POA: Diagnosis not present

## 2016-12-26 DIAGNOSIS — Z923 Personal history of irradiation: Secondary | ICD-10-CM | POA: Insufficient documentation

## 2016-12-26 DIAGNOSIS — Z85818 Personal history of malignant neoplasm of other sites of lip, oral cavity, and pharynx: Secondary | ICD-10-CM | POA: Insufficient documentation

## 2016-12-26 DIAGNOSIS — Z86718 Personal history of other venous thrombosis and embolism: Secondary | ICD-10-CM | POA: Insufficient documentation

## 2016-12-26 DIAGNOSIS — Z87891 Personal history of nicotine dependence: Secondary | ICD-10-CM | POA: Diagnosis not present

## 2016-12-26 DIAGNOSIS — Z9889 Other specified postprocedural states: Secondary | ICD-10-CM | POA: Diagnosis not present

## 2016-12-26 DIAGNOSIS — E039 Hypothyroidism, unspecified: Secondary | ICD-10-CM | POA: Diagnosis not present

## 2016-12-26 DIAGNOSIS — Z951 Presence of aortocoronary bypass graft: Secondary | ICD-10-CM | POA: Insufficient documentation

## 2016-12-26 DIAGNOSIS — Z7982 Long term (current) use of aspirin: Secondary | ICD-10-CM | POA: Diagnosis not present

## 2016-12-26 DIAGNOSIS — Z7902 Long term (current) use of antithrombotics/antiplatelets: Secondary | ICD-10-CM | POA: Insufficient documentation

## 2016-12-26 DIAGNOSIS — M109 Gout, unspecified: Secondary | ICD-10-CM | POA: Diagnosis not present

## 2016-12-26 DIAGNOSIS — I252 Old myocardial infarction: Secondary | ICD-10-CM | POA: Insufficient documentation

## 2016-12-26 DIAGNOSIS — K219 Gastro-esophageal reflux disease without esophagitis: Secondary | ICD-10-CM | POA: Diagnosis not present

## 2016-12-26 DIAGNOSIS — E119 Type 2 diabetes mellitus without complications: Secondary | ICD-10-CM | POA: Diagnosis not present

## 2016-12-26 HISTORY — DX: Cerebral infarction, unspecified: I63.9

## 2016-12-26 HISTORY — DX: Atherosclerotic heart disease of native coronary artery without angina pectoris: I25.10

## 2016-12-26 HISTORY — PX: CATARACT EXTRACTION W/PHACO: SHX586

## 2016-12-26 SURGERY — PHACOEMULSIFICATION, CATARACT, WITH IOL INSERTION
Anesthesia: Monitor Anesthesia Care | Site: Eye | Laterality: Right | Wound class: Clean

## 2016-12-26 MED ORDER — NA CHONDROIT SULF-NA HYALURON 40-17 MG/ML IO SOLN
INTRAOCULAR | Status: AC
  Filled 2016-12-26: qty 1

## 2016-12-26 MED ORDER — LIDOCAINE HCL (PF) 4 % IJ SOLN
INTRAOCULAR | Status: DC | PRN
  Administered 2016-12-26: 4 mL via OPHTHALMIC

## 2016-12-26 MED ORDER — POVIDONE-IODINE 5 % OP SOLN
OPHTHALMIC | Status: AC
  Filled 2016-12-26: qty 30

## 2016-12-26 MED ORDER — EPINEPHRINE PF 1 MG/ML IJ SOLN
INTRAMUSCULAR | Status: AC
  Filled 2016-12-26: qty 2

## 2016-12-26 MED ORDER — FENTANYL CITRATE (PF) 100 MCG/2ML IJ SOLN
INTRAMUSCULAR | Status: AC
  Filled 2016-12-26: qty 2

## 2016-12-26 MED ORDER — ARMC OPHTHALMIC DILATING DROPS
1.0000 "application " | OPHTHALMIC | Status: AC
  Administered 2016-12-26 (×3): 1 via OPHTHALMIC

## 2016-12-26 MED ORDER — EPINEPHRINE PF 1 MG/ML IJ SOLN
INTRAOCULAR | Status: DC | PRN
  Administered 2016-12-26: 12:00:00 via OPHTHALMIC

## 2016-12-26 MED ORDER — ARMC OPHTHALMIC DILATING DROPS
OPHTHALMIC | Status: AC
  Administered 2016-12-26: 1 via OPHTHALMIC
  Filled 2016-12-26: qty 0.4

## 2016-12-26 MED ORDER — MOXIFLOXACIN HCL 0.5 % OP SOLN
OPHTHALMIC | Status: DC | PRN
  Administered 2016-12-26: 0.2 mL via OPHTHALMIC

## 2016-12-26 MED ORDER — MOXIFLOXACIN HCL 0.5 % OP SOLN
OPHTHALMIC | Status: AC
  Filled 2016-12-26: qty 3

## 2016-12-26 MED ORDER — FENTANYL CITRATE (PF) 100 MCG/2ML IJ SOLN
INTRAMUSCULAR | Status: DC | PRN
  Administered 2016-12-26: 50 ug via INTRAVENOUS
  Administered 2016-12-26 (×2): 25 ug via INTRAVENOUS

## 2016-12-26 MED ORDER — MOXIFLOXACIN HCL 0.5 % OP SOLN: 1.0000 [drp] | OPHTHALMIC | Status: DC | PRN

## 2016-12-26 MED ORDER — SODIUM CHLORIDE 0.9 % IV SOLN
INTRAVENOUS | Status: DC
  Administered 2016-12-26: 11:00:00 via INTRAVENOUS

## 2016-12-26 MED ORDER — CARBACHOL 0.01 % IO SOLN
INTRAOCULAR | Status: DC | PRN
  Administered 2016-12-26: 0.5 mL via INTRAOCULAR

## 2016-12-26 MED ORDER — POVIDONE-IODINE 5 % OP SOLN
OPHTHALMIC | Status: DC | PRN
  Administered 2016-12-26: 1 via OPHTHALMIC

## 2016-12-26 MED ORDER — NA CHONDROIT SULF-NA HYALURON 40-17 MG/ML IO SOLN
INTRAOCULAR | Status: DC | PRN
  Administered 2016-12-26: 1 mL via INTRAOCULAR

## 2016-12-26 MED ORDER — LIDOCAINE HCL (PF) 4 % IJ SOLN
INTRAMUSCULAR | Status: AC
  Filled 2016-12-26: qty 5

## 2016-12-26 SURGICAL SUPPLY — 16 items
GLOVE BIO SURGEON STRL SZ8 (GLOVE) ×2 IMPLANT
GLOVE BIOGEL M 6.5 STRL (GLOVE) ×2 IMPLANT
GLOVE SURG LX 8.0 MICRO (GLOVE) ×1
GLOVE SURG LX STRL 8.0 MICRO (GLOVE) ×1 IMPLANT
GOWN STRL REUS W/ TWL LRG LVL3 (GOWN DISPOSABLE) ×2 IMPLANT
GOWN STRL REUS W/TWL LRG LVL3 (GOWN DISPOSABLE) ×4
LABEL CATARACT MEDS ST (LABEL) ×2 IMPLANT
LENS IOL TECNIS ITEC 13.0 (Intraocular Lens) ×1 IMPLANT
PACK CATARACT (MISCELLANEOUS) ×2 IMPLANT
PACK CATARACT BRASINGTON LX (MISCELLANEOUS) ×2 IMPLANT
PACK EYE AFTER SURG (MISCELLANEOUS) ×2 IMPLANT
SOL BSS BAG (MISCELLANEOUS) ×2
SOLUTION BSS BAG (MISCELLANEOUS) ×1 IMPLANT
SYR 5ML LL (SYRINGE) ×2 IMPLANT
WATER STERILE IRR 250ML POUR (IV SOLUTION) ×2 IMPLANT
WIPE NON LINTING 3.25X3.25 (MISCELLANEOUS) ×2 IMPLANT

## 2016-12-26 NOTE — Anesthesia Post-op Follow-up Note (Signed)
Anesthesia QCDR form completed.        

## 2016-12-26 NOTE — Anesthesia Procedure Notes (Signed)
Procedure Name: MAC Date/Time: 12/26/2016 11:34 AM Performed by: Darlyne Russian Pre-anesthesia Checklist: Patient identified, Emergency Drugs available, Suction available, Patient being monitored and Timeout performed Oxygen Delivery Method: Nasal cannula Placement Confirmation: positive ETCO2

## 2016-12-26 NOTE — H&P (Signed)
All labs reviewed. Abnormal studies sent to patients PCP when indicated.  Previous H&P reviewed, patient examined, there are NO CHANGES.  Jesse Allison LOUIS9/18/201811:28 AM

## 2016-12-26 NOTE — Op Note (Signed)
PREOPERATIVE DIAGNOSIS:  Nuclear sclerotic cataract of the right eye.   POSTOPERATIVE DIAGNOSIS:  NUCLEAR SCLEROTIC CATARACT RIGHT EYE   OPERATIVE PROCEDURE: Procedure(s): CATARACT EXTRACTION PHACO AND INTRAOCULAR LENS PLACEMENT (IOC)   SURGEON:  Birder Robson, MD.   ANESTHESIA:  Anesthesiologist: Molli Barrows, MD CRNA: Darlyne Russian, CRNA  1.      Managed anesthesia care. 2.      0.59ml of Shugarcaine was instilled in the eye following the paracentesis.   COMPLICATIONS:  None.   TECHNIQUE:   Stop and chop   DESCRIPTION OF PROCEDURE:  The patient was examined and consented in the preoperative holding area where the aforementioned topical anesthesia was applied to the right eye and then brought back to the Operating Room where the right eye was prepped and draped in the usual sterile ophthalmic fashion and a lid speculum was placed. A paracentesis was created with the side port blade and the anterior chamber was filled with viscoelastic. A near clear corneal incision was performed with the steel keratome. A continuous curvilinear capsulorrhexis was performed with a cystotome followed by the capsulorrhexis forceps. Hydrodissection and hydrodelineation were carried out with BSS on a blunt cannula. The lens was removed in a stop and chop  technique and the remaining cortical material was removed with the irrigation-aspiration handpiece. The capsular bag was inflated with viscoelastic and the Technis ZCB00  lens was placed in the capsular bag without complication. The remaining viscoelastic was removed from the eye with the irrigation-aspiration handpiece. The wounds were hydrated. The anterior chamber was flushed with Miostat and the eye was inflated to physiologic pressure. 0.30ml of Vigamox was placed in the anterior chamber. The wounds were found to be water tight. The eye was dressed with Vigamox. The patient was given protective glasses to wear throughout the day and a shield with which to  sleep tonight. The patient was also given drops with which to begin a drop regimen today and will follow-up with me in one day.  Implant Name Type Inv. Item Serial No. Manufacturer Lot No. LRB No. Used  LENS IOL DIOP 13.0 - [id number] Intraocular Lens LENS IOL DIOP 13.0 [id number]   Right 1   Procedure(s) with comments: CATARACT EXTRACTION PHACO AND INTRAOCULAR LENS PLACEMENT (IOC) (Right) - [place] 00:53.7 AP% 12.9 CDE 6.92 Fluid Pack Lot # [id number]  Electronically signed: [place] 12/26/2016 11:56 AM

## 2016-12-26 NOTE — Anesthesia Preprocedure Evaluation (Signed)
Anesthesia Evaluation  Patient identified by MRN, date of birth, ID band Patient awake    Reviewed: Allergy & Precautions, H&P , NPO status , Patient's Chart, lab work & pertinent test results, reviewed documented beta blocker date and time   Airway Mallampati: III  TM Distance: >3 FB Neck ROM: full    Dental no notable dental hx. (+) Teeth Intact   Pulmonary neg pulmonary ROS, sleep apnea and Continuous Positive Airway Pressure Ventilation , former smoker,    Pulmonary exam normal breath sounds clear to auscultation       Cardiovascular Exercise Tolerance: Poor hypertension, On Medications + CAD and + Past MI  negative cardio ROS   Rhythm:regular Rate:Normal     Neuro/Psych TIACVA negative neurological ROS  negative psych ROS   GI/Hepatic negative GI ROS, Neg liver ROS, GERD  Medicated,  Endo/Other  negative endocrine ROSdiabetesHypothyroidism   Renal/GU      Musculoskeletal   Abdominal   Peds  Hematology negative hematology ROS (+) anemia ,   Anesthesia Other Findings   Reproductive/Obstetrics negative OB ROS                             Anesthesia Physical Anesthesia Plan  ASA: III  Anesthesia Plan: MAC   Post-op Pain Management:    Induction:   PONV Risk Score and Plan: 1 and Ondansetron and Dexamethasone  Airway Management Planned:   Additional Equipment:   Intra-op Plan:   Post-operative Plan:   Informed Consent: I have reviewed the patients History and Physical, chart, labs and discussed the procedure including the risks, benefits and alternatives for the proposed anesthesia with the patient or authorized representative who has indicated his/her understanding and acceptance.     Plan Discussed with: CRNA  Anesthesia Plan Comments:         Anesthesia Quick Evaluation

## 2016-12-26 NOTE — Discharge Instructions (Signed)
Follow Dr. Inda Coke postop eye drop instruction sheet as reviewed.  Eye Surgery Discharge Instructions  Expect mild scratchy sensation or mild soreness. DO NOT RUB YOUR EYE!  The day of surgery:  Minimal physical activity, but bed rest is not required  No reading, computer work, or close hand work  No bending, lifting, or straining.  May watch TV  For 24 hours:  No driving, legal decisions, or alcoholic beverages  Safety precautions  Eat anything you prefer: It is better to start with liquids, then soup then solid foods.  _____ Eye patch should be worn until postoperative exam tomorrow.  ____ Solar shield eyeglasses should be worn for comfort in the sunlight/patch while sleeping  Resume all regular medications including aspirin or Coumadin if these were discontinued prior to surgery. You may shower, bathe, shave, or wash your hair. Tylenol may be taken for mild discomfort.  Call your doctor if you experience significant pain, nausea, or vomiting, fever > 101 or other signs of infection. [phone] or [phone] Specific instructions:  Follow-up Information    Birder Robson, MD Follow up.   Specialty:  Ophthalmology Why:  TODAY 12-26-16 @ 2:45 PM Contact information: [address] [place] [address] [phone]

## 2016-12-26 NOTE — Anesthesia Postprocedure Evaluation (Signed)
Anesthesia Post Note  Patient: SABAN HEINLEN  Procedure(s) Performed: Procedure(s) (LRB): CATARACT EXTRACTION PHACO AND INTRAOCULAR LENS PLACEMENT (IOC) (Right)  Patient location during evaluation: PACU Anesthesia Type: MAC Level of consciousness: awake and alert Pain management: pain level controlled Vital Signs Assessment: post-procedure vital signs reviewed and stable Respiratory status: spontaneous breathing, nonlabored ventilation, respiratory function stable and patient connected to nasal cannula oxygen Cardiovascular status: stable and blood pressure returned to baseline Postop Assessment: no apparent nausea or vomiting Anesthetic complications: no     Last Vitals:  Vitals:   12/26/16 1034 12/26/16 1158  BP: (!) 162/92 (!) 169/84  Pulse: 63 68  Resp: 18 16  Temp: (!) 35.9 C 37.1 C  SpO2: 95% 98%    Last Pain:  Vitals:   12/26/16 1158  TempSrc: Oral                 Darlyne Russian

## 2016-12-26 NOTE — Transfer of Care (Signed)
Immediate Anesthesia Transfer of Care Note  Patient: Jesse Allison  Procedure(s) Performed: Procedure(s) with comments: CATARACT EXTRACTION PHACO AND INTRAOCULAR LENS PLACEMENT (IOC) (Right) - [place] 00:53.7 AP% 12.9 CDE 6.92 Fluid Pack Lot # [id number]  Patient Location: PACU  Anesthesia Type:MAC  Level of Consciousness: awake, alert  and oriented  Airway & Oxygen Therapy: Patient Spontanous Breathing  Post-op Assessment: Report given to RN and Post -op Vital signs reviewed and stable  Post vital signs: Reviewed and stable  Last Vitals:  Vitals:   12/26/16 1034 12/26/16 1158  BP: (!) 162/92 (!) 169/84  Pulse: 63 68  Resp: 18 16  Temp: (!) 35.9 C 37.1 C  SpO2: 95% 98%    Last Pain:  Vitals:   12/26/16 1158  TempSrc: Oral         Complications: No apparent anesthesia complications

## 2017-01-18 DIAGNOSIS — J330 Polyp of nasal cavity: Secondary | ICD-10-CM | POA: Diagnosis not present

## 2017-01-18 DIAGNOSIS — G4733 Obstructive sleep apnea (adult) (pediatric): Secondary | ICD-10-CM | POA: Diagnosis not present

## 2017-01-18 DIAGNOSIS — Z8521 Personal history of malignant neoplasm of larynx: Secondary | ICD-10-CM | POA: Diagnosis not present

## 2017-02-01 DIAGNOSIS — M109 Gout, unspecified: Secondary | ICD-10-CM | POA: Diagnosis not present

## 2017-02-01 DIAGNOSIS — E538 Deficiency of other specified B group vitamins: Secondary | ICD-10-CM | POA: Diagnosis not present

## 2017-02-01 DIAGNOSIS — E119 Type 2 diabetes mellitus without complications: Secondary | ICD-10-CM | POA: Diagnosis not present

## 2017-02-01 DIAGNOSIS — I10 Essential (primary) hypertension: Secondary | ICD-10-CM | POA: Diagnosis not present

## 2017-02-05 DIAGNOSIS — M109 Gout, unspecified: Secondary | ICD-10-CM | POA: Diagnosis not present

## 2017-02-05 DIAGNOSIS — E119 Type 2 diabetes mellitus without complications: Secondary | ICD-10-CM | POA: Diagnosis not present

## 2017-02-05 DIAGNOSIS — I10 Essential (primary) hypertension: Secondary | ICD-10-CM | POA: Diagnosis not present

## 2017-02-05 DIAGNOSIS — E538 Deficiency of other specified B group vitamins: Secondary | ICD-10-CM | POA: Diagnosis not present

## 2017-02-08 DIAGNOSIS — D692 Other nonthrombocytopenic purpura: Secondary | ICD-10-CM | POA: Diagnosis not present

## 2017-02-08 DIAGNOSIS — G4733 Obstructive sleep apnea (adult) (pediatric): Secondary | ICD-10-CM | POA: Diagnosis not present

## 2017-02-08 DIAGNOSIS — I739 Peripheral vascular disease, unspecified: Secondary | ICD-10-CM | POA: Diagnosis not present

## 2017-02-08 DIAGNOSIS — K219 Gastro-esophageal reflux disease without esophagitis: Secondary | ICD-10-CM | POA: Diagnosis not present

## 2017-02-08 DIAGNOSIS — E119 Type 2 diabetes mellitus without complications: Secondary | ICD-10-CM | POA: Diagnosis not present

## 2017-02-08 DIAGNOSIS — F419 Anxiety disorder, unspecified: Secondary | ICD-10-CM | POA: Diagnosis not present

## 2017-02-08 DIAGNOSIS — I714 Abdominal aortic aneurysm, without rupture: Secondary | ICD-10-CM | POA: Diagnosis not present

## 2017-02-08 DIAGNOSIS — M48062 Spinal stenosis, lumbar region with neurogenic claudication: Secondary | ICD-10-CM | POA: Diagnosis not present

## 2017-02-08 DIAGNOSIS — J439 Emphysema, unspecified: Secondary | ICD-10-CM | POA: Diagnosis not present

## 2017-02-08 DIAGNOSIS — M109 Gout, unspecified: Secondary | ICD-10-CM | POA: Diagnosis not present

## 2017-02-08 DIAGNOSIS — I25710 Atherosclerosis of autologous vein coronary artery bypass graft(s) with unstable angina pectoris: Secondary | ICD-10-CM | POA: Diagnosis not present

## 2017-02-08 DIAGNOSIS — I10 Essential (primary) hypertension: Secondary | ICD-10-CM | POA: Diagnosis not present

## 2017-03-21 DIAGNOSIS — L304 Erythema intertrigo: Secondary | ICD-10-CM | POA: Diagnosis not present

## 2017-03-21 DIAGNOSIS — L720 Epidermal cyst: Secondary | ICD-10-CM | POA: Diagnosis not present

## 2017-03-21 DIAGNOSIS — B372 Candidiasis of skin and nail: Secondary | ICD-10-CM | POA: Diagnosis not present

## 2017-03-21 DIAGNOSIS — L821 Other seborrheic keratosis: Secondary | ICD-10-CM | POA: Diagnosis not present

## 2017-06-01 DIAGNOSIS — M5137 Other intervertebral disc degeneration, lumbosacral region: Secondary | ICD-10-CM | POA: Diagnosis not present

## 2017-06-01 DIAGNOSIS — M25551 Pain in right hip: Secondary | ICD-10-CM | POA: Diagnosis not present

## 2017-06-01 DIAGNOSIS — M9905 Segmental and somatic dysfunction of pelvic region: Secondary | ICD-10-CM | POA: Diagnosis not present

## 2017-06-01 DIAGNOSIS — M7611 Psoas tendinitis, right hip: Secondary | ICD-10-CM | POA: Diagnosis not present

## 2017-06-01 DIAGNOSIS — M9904 Segmental and somatic dysfunction of sacral region: Secondary | ICD-10-CM | POA: Diagnosis not present

## 2017-06-01 DIAGNOSIS — M5136 Other intervertebral disc degeneration, lumbar region: Secondary | ICD-10-CM | POA: Diagnosis not present

## 2017-06-01 DIAGNOSIS — M5417 Radiculopathy, lumbosacral region: Secondary | ICD-10-CM | POA: Diagnosis not present

## 2017-06-01 DIAGNOSIS — M7918 Myalgia, other site: Secondary | ICD-10-CM | POA: Diagnosis not present

## 2017-06-01 DIAGNOSIS — M9903 Segmental and somatic dysfunction of lumbar region: Secondary | ICD-10-CM | POA: Diagnosis not present

## 2017-06-01 DIAGNOSIS — M5416 Radiculopathy, lumbar region: Secondary | ICD-10-CM | POA: Diagnosis not present

## 2017-06-04 DIAGNOSIS — M5416 Radiculopathy, lumbar region: Secondary | ICD-10-CM | POA: Diagnosis not present

## 2017-06-04 DIAGNOSIS — M7918 Myalgia, other site: Secondary | ICD-10-CM | POA: Diagnosis not present

## 2017-06-04 DIAGNOSIS — M5136 Other intervertebral disc degeneration, lumbar region: Secondary | ICD-10-CM | POA: Diagnosis not present

## 2017-06-04 DIAGNOSIS — M9905 Segmental and somatic dysfunction of pelvic region: Secondary | ICD-10-CM | POA: Diagnosis not present

## 2017-06-04 DIAGNOSIS — M5417 Radiculopathy, lumbosacral region: Secondary | ICD-10-CM | POA: Diagnosis not present

## 2017-06-04 DIAGNOSIS — M5137 Other intervertebral disc degeneration, lumbosacral region: Secondary | ICD-10-CM | POA: Diagnosis not present

## 2017-06-04 DIAGNOSIS — M7611 Psoas tendinitis, right hip: Secondary | ICD-10-CM | POA: Diagnosis not present

## 2017-06-04 DIAGNOSIS — M9903 Segmental and somatic dysfunction of lumbar region: Secondary | ICD-10-CM | POA: Diagnosis not present

## 2017-06-04 DIAGNOSIS — M9904 Segmental and somatic dysfunction of sacral region: Secondary | ICD-10-CM | POA: Diagnosis not present

## 2017-06-04 DIAGNOSIS — M25551 Pain in right hip: Secondary | ICD-10-CM | POA: Diagnosis not present

## 2017-06-13 DIAGNOSIS — M1611 Unilateral primary osteoarthritis, right hip: Secondary | ICD-10-CM | POA: Diagnosis not present

## 2017-06-13 DIAGNOSIS — M48062 Spinal stenosis, lumbar region with neurogenic claudication: Secondary | ICD-10-CM | POA: Diagnosis not present

## 2017-06-13 DIAGNOSIS — M160 Bilateral primary osteoarthritis of hip: Secondary | ICD-10-CM | POA: Diagnosis not present

## 2017-06-13 DIAGNOSIS — M5136 Other intervertebral disc degeneration, lumbar region: Secondary | ICD-10-CM | POA: Diagnosis not present

## 2017-06-13 DIAGNOSIS — M5416 Radiculopathy, lumbar region: Secondary | ICD-10-CM | POA: Diagnosis not present

## 2017-06-13 DIAGNOSIS — M1612 Unilateral primary osteoarthritis, left hip: Secondary | ICD-10-CM | POA: Diagnosis not present

## 2017-07-26 DIAGNOSIS — J310 Chronic rhinitis: Secondary | ICD-10-CM | POA: Diagnosis not present

## 2017-07-26 DIAGNOSIS — J330 Polyp of nasal cavity: Secondary | ICD-10-CM | POA: Diagnosis not present

## 2017-07-26 DIAGNOSIS — J301 Allergic rhinitis due to pollen: Secondary | ICD-10-CM | POA: Diagnosis not present

## 2017-07-26 DIAGNOSIS — Z8521 Personal history of malignant neoplasm of larynx: Secondary | ICD-10-CM | POA: Diagnosis not present

## 2017-07-26 DIAGNOSIS — H6123 Impacted cerumen, bilateral: Secondary | ICD-10-CM | POA: Diagnosis not present

## 2017-08-01 DIAGNOSIS — B9689 Other specified bacterial agents as the cause of diseases classified elsewhere: Secondary | ICD-10-CM | POA: Diagnosis not present

## 2017-08-01 DIAGNOSIS — J209 Acute bronchitis, unspecified: Secondary | ICD-10-CM | POA: Diagnosis not present

## 2017-08-01 DIAGNOSIS — J019 Acute sinusitis, unspecified: Secondary | ICD-10-CM | POA: Diagnosis not present

## 2017-08-16 DIAGNOSIS — Z961 Presence of intraocular lens: Secondary | ICD-10-CM | POA: Diagnosis not present

## 2017-08-24 DIAGNOSIS — G4733 Obstructive sleep apnea (adult) (pediatric): Secondary | ICD-10-CM | POA: Diagnosis not present

## 2017-08-31 DIAGNOSIS — M1611 Unilateral primary osteoarthritis, right hip: Secondary | ICD-10-CM | POA: Diagnosis not present

## 2017-09-19 DIAGNOSIS — I714 Abdominal aortic aneurysm, without rupture: Secondary | ICD-10-CM | POA: Diagnosis not present

## 2017-09-19 DIAGNOSIS — E782 Mixed hyperlipidemia: Secondary | ICD-10-CM | POA: Diagnosis not present

## 2017-09-19 DIAGNOSIS — I10 Essential (primary) hypertension: Secondary | ICD-10-CM | POA: Diagnosis not present

## 2017-09-19 DIAGNOSIS — Z125 Encounter for screening for malignant neoplasm of prostate: Secondary | ICD-10-CM | POA: Diagnosis not present

## 2017-09-19 DIAGNOSIS — D692 Other nonthrombocytopenic purpura: Secondary | ICD-10-CM | POA: Diagnosis not present

## 2017-09-19 DIAGNOSIS — E119 Type 2 diabetes mellitus without complications: Secondary | ICD-10-CM | POA: Diagnosis not present

## 2017-09-20 DIAGNOSIS — I10 Essential (primary) hypertension: Secondary | ICD-10-CM | POA: Diagnosis not present

## 2017-09-20 DIAGNOSIS — D692 Other nonthrombocytopenic purpura: Secondary | ICD-10-CM | POA: Diagnosis not present

## 2017-09-20 DIAGNOSIS — E782 Mixed hyperlipidemia: Secondary | ICD-10-CM | POA: Diagnosis not present

## 2017-09-20 DIAGNOSIS — E119 Type 2 diabetes mellitus without complications: Secondary | ICD-10-CM | POA: Diagnosis not present

## 2017-09-20 DIAGNOSIS — I714 Abdominal aortic aneurysm, without rupture: Secondary | ICD-10-CM | POA: Diagnosis not present

## 2017-09-20 DIAGNOSIS — Z125 Encounter for screening for malignant neoplasm of prostate: Secondary | ICD-10-CM | POA: Diagnosis not present

## 2017-09-28 DIAGNOSIS — D692 Other nonthrombocytopenic purpura: Secondary | ICD-10-CM | POA: Diagnosis not present

## 2017-09-28 DIAGNOSIS — K219 Gastro-esophageal reflux disease without esophagitis: Secondary | ICD-10-CM | POA: Diagnosis not present

## 2017-09-28 DIAGNOSIS — M47816 Spondylosis without myelopathy or radiculopathy, lumbar region: Secondary | ICD-10-CM | POA: Diagnosis not present

## 2017-09-28 DIAGNOSIS — I25710 Atherosclerosis of autologous vein coronary artery bypass graft(s) with unstable angina pectoris: Secondary | ICD-10-CM | POA: Diagnosis not present

## 2017-09-28 DIAGNOSIS — M109 Gout, unspecified: Secondary | ICD-10-CM | POA: Diagnosis not present

## 2017-09-28 DIAGNOSIS — I714 Abdominal aortic aneurysm, without rupture: Secondary | ICD-10-CM | POA: Diagnosis not present

## 2017-09-28 DIAGNOSIS — E1169 Type 2 diabetes mellitus with other specified complication: Secondary | ICD-10-CM | POA: Insufficient documentation

## 2017-09-28 DIAGNOSIS — G4733 Obstructive sleep apnea (adult) (pediatric): Secondary | ICD-10-CM | POA: Diagnosis not present

## 2017-09-28 DIAGNOSIS — M48062 Spinal stenosis, lumbar region with neurogenic claudication: Secondary | ICD-10-CM | POA: Diagnosis not present

## 2017-09-28 DIAGNOSIS — E119 Type 2 diabetes mellitus without complications: Secondary | ICD-10-CM | POA: Diagnosis not present

## 2017-09-28 DIAGNOSIS — I10 Essential (primary) hypertension: Secondary | ICD-10-CM | POA: Diagnosis not present

## 2017-09-28 DIAGNOSIS — J439 Emphysema, unspecified: Secondary | ICD-10-CM | POA: Diagnosis not present

## 2017-09-28 DIAGNOSIS — I739 Peripheral vascular disease, unspecified: Secondary | ICD-10-CM | POA: Diagnosis not present

## 2017-09-28 DIAGNOSIS — Z0000 Encounter for general adult medical examination without abnormal findings: Secondary | ICD-10-CM | POA: Diagnosis not present

## 2017-09-28 DIAGNOSIS — B351 Tinea unguium: Secondary | ICD-10-CM | POA: Insufficient documentation

## 2017-10-25 DIAGNOSIS — D508 Other iron deficiency anemias: Secondary | ICD-10-CM | POA: Diagnosis not present

## 2018-03-21 DIAGNOSIS — E119 Type 2 diabetes mellitus without complications: Secondary | ICD-10-CM | POA: Diagnosis not present

## 2018-03-21 DIAGNOSIS — D508 Other iron deficiency anemias: Secondary | ICD-10-CM | POA: Diagnosis not present

## 2018-03-21 DIAGNOSIS — E782 Mixed hyperlipidemia: Secondary | ICD-10-CM | POA: Diagnosis not present

## 2018-03-21 DIAGNOSIS — E538 Deficiency of other specified B group vitamins: Secondary | ICD-10-CM | POA: Diagnosis not present

## 2018-03-21 DIAGNOSIS — I714 Abdominal aortic aneurysm, without rupture: Secondary | ICD-10-CM | POA: Diagnosis not present

## 2018-03-21 DIAGNOSIS — I10 Essential (primary) hypertension: Secondary | ICD-10-CM | POA: Diagnosis not present

## 2018-03-21 DIAGNOSIS — I739 Peripheral vascular disease, unspecified: Secondary | ICD-10-CM | POA: Diagnosis not present

## 2018-03-21 DIAGNOSIS — M109 Gout, unspecified: Secondary | ICD-10-CM | POA: Diagnosis not present

## 2018-03-28 DIAGNOSIS — G4733 Obstructive sleep apnea (adult) (pediatric): Secondary | ICD-10-CM | POA: Diagnosis not present

## 2018-03-28 DIAGNOSIS — J439 Emphysema, unspecified: Secondary | ICD-10-CM | POA: Diagnosis not present

## 2018-03-28 DIAGNOSIS — I25710 Atherosclerosis of autologous vein coronary artery bypass graft(s) with unstable angina pectoris: Secondary | ICD-10-CM | POA: Diagnosis not present

## 2018-03-28 DIAGNOSIS — M109 Gout, unspecified: Secondary | ICD-10-CM | POA: Diagnosis not present

## 2018-03-28 DIAGNOSIS — I714 Abdominal aortic aneurysm, without rupture: Secondary | ICD-10-CM | POA: Diagnosis not present

## 2018-03-28 DIAGNOSIS — I739 Peripheral vascular disease, unspecified: Secondary | ICD-10-CM | POA: Diagnosis not present

## 2018-03-28 DIAGNOSIS — D692 Other nonthrombocytopenic purpura: Secondary | ICD-10-CM | POA: Diagnosis not present

## 2018-03-28 DIAGNOSIS — E785 Hyperlipidemia, unspecified: Secondary | ICD-10-CM | POA: Diagnosis not present

## 2018-03-28 DIAGNOSIS — K219 Gastro-esophageal reflux disease without esophagitis: Secondary | ICD-10-CM | POA: Diagnosis not present

## 2018-03-28 DIAGNOSIS — I10 Essential (primary) hypertension: Secondary | ICD-10-CM | POA: Diagnosis not present

## 2018-03-28 DIAGNOSIS — E1169 Type 2 diabetes mellitus with other specified complication: Secondary | ICD-10-CM | POA: Diagnosis not present

## 2018-03-28 DIAGNOSIS — E119 Type 2 diabetes mellitus without complications: Secondary | ICD-10-CM | POA: Diagnosis not present

## 2018-04-16 ENCOUNTER — Encounter: Payer: Self-pay | Admitting: Podiatry

## 2018-04-16 ENCOUNTER — Ambulatory Visit: Payer: PPO | Admitting: Podiatry

## 2018-04-16 VITALS — BP 137/73 | HR 53

## 2018-04-16 DIAGNOSIS — L989 Disorder of the skin and subcutaneous tissue, unspecified: Secondary | ICD-10-CM | POA: Diagnosis not present

## 2018-04-16 DIAGNOSIS — B351 Tinea unguium: Secondary | ICD-10-CM | POA: Diagnosis not present

## 2018-04-16 DIAGNOSIS — M79676 Pain in unspecified toe(s): Secondary | ICD-10-CM | POA: Diagnosis not present

## 2018-04-18 NOTE — Progress Notes (Signed)
    Subjective: Patient is a 78 y.o. male presenting to the office today as a new patient with a chief complaint of a painful callus lesion to the right hallux that has been present for the past 4-5 months. Walking and bearing weight increases the pain. He has not had any treatment for the symptoms.   Patient also complains of elongated, thickened nails that cause pain while ambulating in shoes. He is unable to trim his own nails. Patient presents today for further treatment and evaluation.  Past Medical History:  Diagnosis Date  . Anemia   . Cancer (HCC)    throat  . Coronary artery disease   . GERD (gastroesophageal reflux disease)   . Gout   . Hypercholesterolemia   . Hypertension   . Hypothyroidism   . Myocardial infarction ([place]) 1993  . Sleep apnea   . Stroke ([facility])   . TIA (transient ischemic attack)     Objective:  Physical Exam General: Alert and oriented x3 in no acute distress  Dermatology: Hyperkeratotic lesion present on the right hallux. Pain on palpation with a central nucleated core noted. Skin is warm, dry and supple bilateral lower extremities. Negative for open lesions or macerations. Nails are tender, long, thickened and dystrophic with subungual debris, consistent with onychomycosis, 1-5 bilateral. No signs of infection noted.  Vascular: Palpable pedal pulses bilaterally. No edema or erythema noted. Capillary refill within normal limits.  Neurological: Epicritic and protective threshold grossly intact bilaterally.   Musculoskeletal Exam: Pain on palpation at the keratotic lesion noted. Range of motion within normal limits bilateral. Muscle strength 5/5 in all groups bilateral.  Assessment: 1. Onychodystrophic nails 1-5 bilateral with hyperkeratosis of nails.  2. Onychomycosis of nail due to dermatophyte bilateral 3. Porokeratosis noted to the right hallux   Plan of Care:  1. Patient evaluated. 2. Excisional debridement of keratoic lesion using a chisel  blade was performed without incident.  3. Dressed with light dressing. 4. Mechanical debridement of nails 1-5 bilaterally performed using a nail nipper. Filed with dremel without incident.  5. Patient is to return to the clinic in 3 months.   Edrick Kins, DPM [facility]  Dr. Edrick Kins, [address]                                        [place], [place] [address]                Office [phone]  Fax [phone]

## 2018-05-27 DIAGNOSIS — G4733 Obstructive sleep apnea (adult) (pediatric): Secondary | ICD-10-CM | POA: Diagnosis not present

## 2018-06-25 DIAGNOSIS — G4733 Obstructive sleep apnea (adult) (pediatric): Secondary | ICD-10-CM | POA: Diagnosis not present

## 2018-07-18 ENCOUNTER — Ambulatory Visit: Payer: PPO | Admitting: Podiatry

## 2018-07-26 DIAGNOSIS — G4733 Obstructive sleep apnea (adult) (pediatric): Secondary | ICD-10-CM | POA: Diagnosis not present

## 2018-08-15 ENCOUNTER — Encounter: Payer: Self-pay | Admitting: Podiatry

## 2018-08-15 ENCOUNTER — Ambulatory Visit: Payer: PPO | Admitting: Podiatry

## 2018-08-15 ENCOUNTER — Other Ambulatory Visit: Payer: Self-pay

## 2018-08-15 VITALS — Temp 96.5°F

## 2018-08-15 DIAGNOSIS — E119 Type 2 diabetes mellitus without complications: Secondary | ICD-10-CM

## 2018-08-15 DIAGNOSIS — M79676 Pain in unspecified toe(s): Secondary | ICD-10-CM | POA: Diagnosis not present

## 2018-08-15 DIAGNOSIS — I739 Peripheral vascular disease, unspecified: Secondary | ICD-10-CM | POA: Insufficient documentation

## 2018-08-15 DIAGNOSIS — K219 Gastro-esophageal reflux disease without esophagitis: Secondary | ICD-10-CM | POA: Insufficient documentation

## 2018-08-15 DIAGNOSIS — I10 Essential (primary) hypertension: Secondary | ICD-10-CM | POA: Insufficient documentation

## 2018-08-15 DIAGNOSIS — J449 Chronic obstructive pulmonary disease, unspecified: Secondary | ICD-10-CM | POA: Insufficient documentation

## 2018-08-15 DIAGNOSIS — I25810 Atherosclerosis of coronary artery bypass graft(s) without angina pectoris: Secondary | ICD-10-CM | POA: Insufficient documentation

## 2018-08-15 DIAGNOSIS — B351 Tinea unguium: Secondary | ICD-10-CM | POA: Diagnosis not present

## 2018-08-15 DIAGNOSIS — K5790 Diverticulosis of intestine, part unspecified, without perforation or abscess without bleeding: Secondary | ICD-10-CM | POA: Insufficient documentation

## 2018-08-15 DIAGNOSIS — R609 Edema, unspecified: Secondary | ICD-10-CM | POA: Insufficient documentation

## 2018-08-15 DIAGNOSIS — E782 Mixed hyperlipidemia: Secondary | ICD-10-CM | POA: Insufficient documentation

## 2018-08-15 DIAGNOSIS — F419 Anxiety disorder, unspecified: Secondary | ICD-10-CM | POA: Insufficient documentation

## 2018-08-15 DIAGNOSIS — E538 Deficiency of other specified B group vitamins: Secondary | ICD-10-CM | POA: Insufficient documentation

## 2018-08-15 DIAGNOSIS — G4730 Sleep apnea, unspecified: Secondary | ICD-10-CM | POA: Insufficient documentation

## 2018-08-15 DIAGNOSIS — M109 Gout, unspecified: Secondary | ICD-10-CM | POA: Insufficient documentation

## 2018-08-15 NOTE — Progress Notes (Signed)
Complaint:  Visit Type: Patient returns to my office for continued preventative foot care services. Complaint: Patient states" my nails have grown long and thick and become painful to walk and wear shoes"  The patient presents for preventative foot care services. No changes to ROS  Podiatric Exam: Vascular: dorsalis pedis and posterior tibial pulses are palpable bilateral. Capillary return is immediate. Temperature gradient is WNL. Skin turgor WNL  Sensorium: Normal Semmes Weinstein monofilament test. Normal tactile sensation bilaterally. Nail Exam: Pt has thick disfigured discolored nails with subungual debris noted bilateral entire nail hallux through fifth toenails Ulcer Exam: There is no evidence of ulcer or pre-ulcerative changes or infection. Orthopedic Exam: Muscle tone and strength are WNL. No limitations in general ROM. No crepitus or effusions noted. Foot type and digits show no abnormalities. Bony prominences are unremarkable. Skin: No Porokeratosis. No infection or ulcers.  Asymptomatic callus distal aspect right hallux.  Diagnosis:  Onychomycosis, , Pain in right toe, pain in left toes  Treatment & Plan Procedures and Treatment: Consent by patient was obtained for treatment procedures.   Debridement of mycotic and hypertrophic toenails, 1 through 5 bilateral and clearing of subungual debris. No ulceration, no infection noted. Dispense toe cap. Return Visit-Office Procedure: Patient instructed to return to the office for a follow up visit 3 months for continued evaluation and treatment.    Gardiner Barefoot DPM

## 2018-08-22 DIAGNOSIS — R05 Cough: Secondary | ICD-10-CM | POA: Diagnosis not present

## 2018-08-22 DIAGNOSIS — Z8521 Personal history of malignant neoplasm of larynx: Secondary | ICD-10-CM | POA: Diagnosis not present

## 2018-08-22 DIAGNOSIS — Z87891 Personal history of nicotine dependence: Secondary | ICD-10-CM | POA: Diagnosis not present

## 2018-08-22 DIAGNOSIS — Z08 Encounter for follow-up examination after completed treatment for malignant neoplasm: Secondary | ICD-10-CM | POA: Diagnosis not present

## 2018-08-22 DIAGNOSIS — K219 Gastro-esophageal reflux disease without esophagitis: Secondary | ICD-10-CM | POA: Diagnosis not present

## 2018-08-22 DIAGNOSIS — J301 Allergic rhinitis due to pollen: Secondary | ICD-10-CM | POA: Diagnosis not present

## 2018-08-25 DIAGNOSIS — G4733 Obstructive sleep apnea (adult) (pediatric): Secondary | ICD-10-CM | POA: Diagnosis not present

## 2018-08-26 DIAGNOSIS — G4733 Obstructive sleep apnea (adult) (pediatric): Secondary | ICD-10-CM | POA: Diagnosis not present

## 2018-09-24 DIAGNOSIS — I25710 Atherosclerosis of autologous vein coronary artery bypass graft(s) with unstable angina pectoris: Secondary | ICD-10-CM | POA: Diagnosis not present

## 2018-09-24 DIAGNOSIS — I714 Abdominal aortic aneurysm, without rupture: Secondary | ICD-10-CM | POA: Diagnosis not present

## 2018-09-24 DIAGNOSIS — E785 Hyperlipidemia, unspecified: Secondary | ICD-10-CM | POA: Diagnosis not present

## 2018-09-24 DIAGNOSIS — M109 Gout, unspecified: Secondary | ICD-10-CM | POA: Diagnosis not present

## 2018-09-24 DIAGNOSIS — E119 Type 2 diabetes mellitus without complications: Secondary | ICD-10-CM | POA: Diagnosis not present

## 2018-09-24 DIAGNOSIS — I739 Peripheral vascular disease, unspecified: Secondary | ICD-10-CM | POA: Diagnosis not present

## 2018-09-24 DIAGNOSIS — I10 Essential (primary) hypertension: Secondary | ICD-10-CM | POA: Diagnosis not present

## 2018-09-25 DIAGNOSIS — G4733 Obstructive sleep apnea (adult) (pediatric): Secondary | ICD-10-CM | POA: Diagnosis not present

## 2018-10-01 DIAGNOSIS — I10 Essential (primary) hypertension: Secondary | ICD-10-CM | POA: Diagnosis not present

## 2018-10-01 DIAGNOSIS — M109 Gout, unspecified: Secondary | ICD-10-CM | POA: Diagnosis not present

## 2018-10-01 DIAGNOSIS — I25710 Atherosclerosis of autologous vein coronary artery bypass graft(s) with unstable angina pectoris: Secondary | ICD-10-CM | POA: Diagnosis not present

## 2018-10-01 DIAGNOSIS — M5416 Radiculopathy, lumbar region: Secondary | ICD-10-CM | POA: Diagnosis not present

## 2018-10-01 DIAGNOSIS — I739 Peripheral vascular disease, unspecified: Secondary | ICD-10-CM | POA: Diagnosis not present

## 2018-10-01 DIAGNOSIS — D692 Other nonthrombocytopenic purpura: Secondary | ICD-10-CM | POA: Diagnosis not present

## 2018-10-01 DIAGNOSIS — I714 Abdominal aortic aneurysm, without rupture: Secondary | ICD-10-CM | POA: Diagnosis not present

## 2018-10-01 DIAGNOSIS — E1159 Type 2 diabetes mellitus with other circulatory complications: Secondary | ICD-10-CM | POA: Diagnosis not present

## 2018-10-01 DIAGNOSIS — K219 Gastro-esophageal reflux disease without esophagitis: Secondary | ICD-10-CM | POA: Diagnosis not present

## 2018-10-01 DIAGNOSIS — E1169 Type 2 diabetes mellitus with other specified complication: Secondary | ICD-10-CM | POA: Diagnosis not present

## 2018-10-01 DIAGNOSIS — J439 Emphysema, unspecified: Secondary | ICD-10-CM | POA: Diagnosis not present

## 2018-10-01 DIAGNOSIS — G4733 Obstructive sleep apnea (adult) (pediatric): Secondary | ICD-10-CM | POA: Diagnosis not present

## 2018-10-01 DIAGNOSIS — Z0000 Encounter for general adult medical examination without abnormal findings: Secondary | ICD-10-CM | POA: Diagnosis not present

## 2018-10-16 DIAGNOSIS — G4733 Obstructive sleep apnea (adult) (pediatric): Secondary | ICD-10-CM | POA: Diagnosis not present

## 2018-10-25 DIAGNOSIS — G4733 Obstructive sleep apnea (adult) (pediatric): Secondary | ICD-10-CM | POA: Diagnosis not present

## 2018-11-14 ENCOUNTER — Ambulatory Visit: Payer: PPO | Admitting: Podiatry

## 2018-11-14 ENCOUNTER — Encounter: Payer: Self-pay | Admitting: Podiatry

## 2018-11-14 ENCOUNTER — Other Ambulatory Visit: Payer: Self-pay

## 2018-11-14 VITALS — Temp 97.4°F

## 2018-11-14 DIAGNOSIS — M79676 Pain in unspecified toe(s): Secondary | ICD-10-CM | POA: Diagnosis not present

## 2018-11-14 DIAGNOSIS — B351 Tinea unguium: Secondary | ICD-10-CM

## 2018-11-14 DIAGNOSIS — E119 Type 2 diabetes mellitus without complications: Secondary | ICD-10-CM

## 2018-11-14 DIAGNOSIS — D689 Coagulation defect, unspecified: Secondary | ICD-10-CM | POA: Diagnosis not present

## 2018-11-14 NOTE — Progress Notes (Signed)
Complaint:  Visit Type: Patient returns to my office for continued preventative foot care services. Complaint: Patient states" my nails have grown long and thick and become painful to walk and wear shoes"  The patient presents for preventative foot care services. No changes to ROS.  Patient is taking plavix.  Podiatric Exam: Vascular: dorsalis pedis and posterior tibial pulses are palpable bilateral. Capillary return is immediate. Temperature gradient is WNL. Skin turgor WNL  Sensorium: Normal Semmes Weinstein monofilament test. Normal tactile sensation bilaterally. Nail Exam: Pt has thick disfigured discolored nails with subungual debris noted bilateral entire nail hallux through fifth toenails Ulcer Exam: There is no evidence of ulcer or pre-ulcerative changes or infection. Orthopedic Exam: Muscle tone and strength are WNL. No limitations in general ROM. No crepitus or effusions noted. Foot type and digits show no abnormalities. Bony prominences are unremarkable. Skin: No Porokeratosis. No infection or ulcers.  Asymptomatic callus distal aspect right hallux.  Diagnosis:  Onychomycosis, , Pain in right toe, pain in left toes  Treatment & Plan Procedures and Treatment: Consent by patient was obtained for treatment procedures.   Debridement of mycotic and hypertrophic toenails, 1 through 5 bilateral and clearing of subungual debris. No ulceration, no infection noted. Dispense toe cap. Return Visit-Office Procedure: Patient instructed to return to the office for a follow up visit 3 months for continued evaluation and treatment.    Gardiner Barefoot DPM

## 2018-11-21 DIAGNOSIS — M1612 Unilateral primary osteoarthritis, left hip: Secondary | ICD-10-CM | POA: Diagnosis not present

## 2018-11-25 DIAGNOSIS — G4733 Obstructive sleep apnea (adult) (pediatric): Secondary | ICD-10-CM | POA: Diagnosis not present

## 2018-11-29 DIAGNOSIS — G4733 Obstructive sleep apnea (adult) (pediatric): Secondary | ICD-10-CM | POA: Diagnosis not present

## 2018-12-18 DIAGNOSIS — Z1283 Encounter for screening for malignant neoplasm of skin: Secondary | ICD-10-CM | POA: Diagnosis not present

## 2018-12-18 DIAGNOSIS — L578 Other skin changes due to chronic exposure to nonionizing radiation: Secondary | ICD-10-CM | POA: Diagnosis not present

## 2018-12-18 DIAGNOSIS — L720 Epidermal cyst: Secondary | ICD-10-CM | POA: Diagnosis not present

## 2018-12-18 DIAGNOSIS — L820 Inflamed seborrheic keratosis: Secondary | ICD-10-CM | POA: Diagnosis not present

## 2018-12-18 DIAGNOSIS — L821 Other seborrheic keratosis: Secondary | ICD-10-CM | POA: Diagnosis not present

## 2018-12-18 DIAGNOSIS — D485 Neoplasm of uncertain behavior of skin: Secondary | ICD-10-CM | POA: Diagnosis not present

## 2018-12-26 DIAGNOSIS — G4733 Obstructive sleep apnea (adult) (pediatric): Secondary | ICD-10-CM | POA: Diagnosis not present

## 2019-01-25 DIAGNOSIS — G4733 Obstructive sleep apnea (adult) (pediatric): Secondary | ICD-10-CM | POA: Diagnosis not present

## 2019-02-04 DIAGNOSIS — L720 Epidermal cyst: Secondary | ICD-10-CM | POA: Diagnosis not present

## 2019-02-11 DIAGNOSIS — Z4802 Encounter for removal of sutures: Secondary | ICD-10-CM | POA: Diagnosis not present

## 2019-02-11 DIAGNOSIS — L720 Epidermal cyst: Secondary | ICD-10-CM | POA: Diagnosis not present

## 2019-02-11 DIAGNOSIS — L2489 Irritant contact dermatitis due to other agents: Secondary | ICD-10-CM | POA: Diagnosis not present

## 2019-02-13 ENCOUNTER — Ambulatory Visit: Payer: PPO | Admitting: Podiatry

## 2019-02-18 DIAGNOSIS — L720 Epidermal cyst: Secondary | ICD-10-CM | POA: Diagnosis not present

## 2019-02-18 DIAGNOSIS — D485 Neoplasm of uncertain behavior of skin: Secondary | ICD-10-CM | POA: Diagnosis not present

## 2019-02-18 DIAGNOSIS — Z4802 Encounter for removal of sutures: Secondary | ICD-10-CM | POA: Diagnosis not present

## 2019-02-25 DIAGNOSIS — Z4802 Encounter for removal of sutures: Secondary | ICD-10-CM | POA: Diagnosis not present

## 2019-02-25 DIAGNOSIS — G4733 Obstructive sleep apnea (adult) (pediatric): Secondary | ICD-10-CM | POA: Diagnosis not present

## 2019-02-25 DIAGNOSIS — L720 Epidermal cyst: Secondary | ICD-10-CM | POA: Diagnosis not present

## 2019-02-27 ENCOUNTER — Other Ambulatory Visit: Payer: Self-pay

## 2019-02-27 ENCOUNTER — Encounter: Payer: Self-pay | Admitting: Podiatry

## 2019-02-27 ENCOUNTER — Ambulatory Visit (INDEPENDENT_AMBULATORY_CARE_PROVIDER_SITE_OTHER): Payer: PPO | Admitting: Podiatry

## 2019-02-27 DIAGNOSIS — B351 Tinea unguium: Secondary | ICD-10-CM | POA: Diagnosis not present

## 2019-02-27 DIAGNOSIS — M79676 Pain in unspecified toe(s): Secondary | ICD-10-CM

## 2019-02-27 DIAGNOSIS — E119 Type 2 diabetes mellitus without complications: Secondary | ICD-10-CM

## 2019-02-27 DIAGNOSIS — D689 Coagulation defect, unspecified: Secondary | ICD-10-CM | POA: Diagnosis not present

## 2019-02-27 NOTE — Progress Notes (Signed)
Complaint:  Visit Type: Patient returns to my office for continued preventative foot care services. Complaint: Patient states" my nails have grown long and thick and become painful to walk and wear shoes"  The patient presents for preventative foot care services. No changes to ROS.  Patient is taking plavix.  Podiatric Exam: Vascular: dorsalis pedis and posterior tibial pulses are palpable bilateral. Capillary return is immediate. Temperature gradient is WNL. Skin turgor WNL  Sensorium: Normal Semmes Weinstein monofilament test. Normal tactile sensation bilaterally. Nail Exam: Pt has thick disfigured discolored nails with subungual debris noted bilateral entire nail hallux through fifth toenails Ulcer Exam: There is no evidence of ulcer or pre-ulcerative changes or infection. Orthopedic Exam: Muscle tone and strength are WNL. No limitations in general ROM. No crepitus or effusions noted. Foot type and digits show no abnormalities. Hallux  Malleus  B/L. Skin: No Porokeratosis. No infection or ulcers.  Asymptomatic callus distal aspect right hallux.  Diagnosis:  Onychomycosis, , Pain in right toe, pain in left toes  Treatment & Plan Procedures and Treatment: Consent by patient was obtained for treatment procedures.   Debridement of mycotic and hypertrophic toenails, 1 through 5 bilateral and clearing of subungual debris. No ulceration, no infection noted. Dispense toe cap. Return Visit-Office Procedure: Patient instructed to return to the office for a follow up visit 3 months for continued evaluation and treatment.    Gardiner Barefoot DPM

## 2019-03-20 DIAGNOSIS — M1611 Unilateral primary osteoarthritis, right hip: Secondary | ICD-10-CM | POA: Diagnosis not present

## 2019-03-25 DIAGNOSIS — D508 Other iron deficiency anemias: Secondary | ICD-10-CM | POA: Diagnosis not present

## 2019-03-25 DIAGNOSIS — E782 Mixed hyperlipidemia: Secondary | ICD-10-CM | POA: Diagnosis not present

## 2019-03-25 DIAGNOSIS — E1159 Type 2 diabetes mellitus with other circulatory complications: Secondary | ICD-10-CM | POA: Diagnosis not present

## 2019-03-25 DIAGNOSIS — Z1159 Encounter for screening for other viral diseases: Secondary | ICD-10-CM | POA: Diagnosis not present

## 2019-03-25 DIAGNOSIS — I10 Essential (primary) hypertension: Secondary | ICD-10-CM | POA: Diagnosis not present

## 2019-03-27 DIAGNOSIS — G4733 Obstructive sleep apnea (adult) (pediatric): Secondary | ICD-10-CM | POA: Diagnosis not present

## 2019-04-01 DIAGNOSIS — D692 Other nonthrombocytopenic purpura: Secondary | ICD-10-CM | POA: Diagnosis not present

## 2019-04-01 DIAGNOSIS — E1159 Type 2 diabetes mellitus with other circulatory complications: Secondary | ICD-10-CM | POA: Diagnosis not present

## 2019-04-01 DIAGNOSIS — E1169 Type 2 diabetes mellitus with other specified complication: Secondary | ICD-10-CM | POA: Diagnosis not present

## 2019-04-01 DIAGNOSIS — K219 Gastro-esophageal reflux disease without esophagitis: Secondary | ICD-10-CM | POA: Diagnosis not present

## 2019-04-01 DIAGNOSIS — I714 Abdominal aortic aneurysm, without rupture: Secondary | ICD-10-CM | POA: Diagnosis not present

## 2019-04-01 DIAGNOSIS — M109 Gout, unspecified: Secondary | ICD-10-CM | POA: Diagnosis not present

## 2019-04-01 DIAGNOSIS — G4733 Obstructive sleep apnea (adult) (pediatric): Secondary | ICD-10-CM | POA: Diagnosis not present

## 2019-04-01 DIAGNOSIS — J439 Emphysema, unspecified: Secondary | ICD-10-CM | POA: Diagnosis not present

## 2019-04-01 DIAGNOSIS — M47816 Spondylosis without myelopathy or radiculopathy, lumbar region: Secondary | ICD-10-CM | POA: Diagnosis not present

## 2019-04-01 DIAGNOSIS — I739 Peripheral vascular disease, unspecified: Secondary | ICD-10-CM | POA: Diagnosis not present

## 2019-04-01 DIAGNOSIS — I10 Essential (primary) hypertension: Secondary | ICD-10-CM | POA: Diagnosis not present

## 2019-04-01 DIAGNOSIS — I25710 Atherosclerosis of autologous vein coronary artery bypass graft(s) with unstable angina pectoris: Secondary | ICD-10-CM | POA: Diagnosis not present

## 2019-04-21 DIAGNOSIS — M1611 Unilateral primary osteoarthritis, right hip: Secondary | ICD-10-CM | POA: Diagnosis not present

## 2019-04-21 DIAGNOSIS — M1612 Unilateral primary osteoarthritis, left hip: Secondary | ICD-10-CM | POA: Diagnosis not present

## 2019-04-21 DIAGNOSIS — M25561 Pain in right knee: Secondary | ICD-10-CM | POA: Diagnosis not present

## 2019-04-21 DIAGNOSIS — M1711 Unilateral primary osteoarthritis, right knee: Secondary | ICD-10-CM | POA: Diagnosis not present

## 2019-04-27 DIAGNOSIS — G4733 Obstructive sleep apnea (adult) (pediatric): Secondary | ICD-10-CM | POA: Diagnosis not present

## 2019-05-28 DIAGNOSIS — G4733 Obstructive sleep apnea (adult) (pediatric): Secondary | ICD-10-CM | POA: Diagnosis not present

## 2019-05-29 ENCOUNTER — Ambulatory Visit: Payer: PPO | Admitting: Podiatry

## 2019-06-02 ENCOUNTER — Other Ambulatory Visit: Payer: Self-pay

## 2019-06-02 ENCOUNTER — Encounter: Payer: Self-pay | Admitting: Podiatry

## 2019-06-02 ENCOUNTER — Ambulatory Visit: Payer: PPO | Admitting: Podiatry

## 2019-06-02 DIAGNOSIS — E119 Type 2 diabetes mellitus without complications: Secondary | ICD-10-CM

## 2019-06-02 DIAGNOSIS — B351 Tinea unguium: Secondary | ICD-10-CM | POA: Diagnosis not present

## 2019-06-02 DIAGNOSIS — M79676 Pain in unspecified toe(s): Secondary | ICD-10-CM

## 2019-06-02 DIAGNOSIS — D689 Coagulation defect, unspecified: Secondary | ICD-10-CM

## 2019-06-02 NOTE — Progress Notes (Signed)
Complaint:  Visit Type: Patient returns to my office for continued preventative foot care services. Complaint: Patient states" my nails have grown long and thick and become painful to walk and wear shoes"  The patient presents for preventative foot care services. No changes to ROS.  Patient is taking plavix.  Podiatric Exam: Vascular: dorsalis pedis and posterior tibial pulses are palpable bilateral. Capillary return is immediate. Temperature gradient is WNL. Skin turgor WNL  Sensorium: Normal Semmes Weinstein monofilament test. Normal tactile sensation bilaterally. Nail Exam: Pt has thick disfigured discolored nails with subungual debris noted bilateral entire nail hallux through fifth toenails Ulcer Exam: There is no evidence of ulcer or pre-ulcerative changes or infection. Orthopedic Exam: Muscle tone and strength are WNL. No limitations in general ROM. No crepitus or effusions noted. Foot type and digits show no abnormalities. Hallux  Malleus  B/L. Skin: No Porokeratosis. No infection or ulcers.  Asymptomatic callus distal aspect right hallux.  Diagnosis:  Onychomycosis, , Pain in right toe, pain in left toes  Treatment & Plan Procedures and Treatment: Consent by patient was obtained for treatment procedures.   Debridement of mycotic and hypertrophic toenails, 1 through 5 bilateral and clearing of subungual debris. No ulceration, no infection noted. Dispense toe cap. Return Visit-Office Procedure: Patient instructed to return to the office for a follow up visit 3 months for continued evaluation and treatment.    Gardiner Barefoot DPM

## 2019-06-18 DIAGNOSIS — M1612 Unilateral primary osteoarthritis, left hip: Secondary | ICD-10-CM | POA: Diagnosis not present

## 2019-06-18 DIAGNOSIS — M1610 Unilateral primary osteoarthritis, unspecified hip: Secondary | ICD-10-CM | POA: Diagnosis not present

## 2019-06-18 DIAGNOSIS — M1611 Unilateral primary osteoarthritis, right hip: Secondary | ICD-10-CM | POA: Diagnosis not present

## 2019-06-27 DIAGNOSIS — M25551 Pain in right hip: Secondary | ICD-10-CM | POA: Diagnosis not present

## 2019-06-27 DIAGNOSIS — M1611 Unilateral primary osteoarthritis, right hip: Secondary | ICD-10-CM | POA: Diagnosis not present

## 2019-07-09 DIAGNOSIS — M7061 Trochanteric bursitis, right hip: Secondary | ICD-10-CM | POA: Diagnosis not present

## 2019-07-09 DIAGNOSIS — M1611 Unilateral primary osteoarthritis, right hip: Secondary | ICD-10-CM | POA: Diagnosis not present

## 2019-07-22 ENCOUNTER — Other Ambulatory Visit: Payer: Self-pay | Admitting: Internal Medicine

## 2019-07-22 DIAGNOSIS — I208 Other forms of angina pectoris: Secondary | ICD-10-CM

## 2019-07-22 DIAGNOSIS — E782 Mixed hyperlipidemia: Secondary | ICD-10-CM | POA: Diagnosis not present

## 2019-07-22 DIAGNOSIS — I10 Essential (primary) hypertension: Secondary | ICD-10-CM | POA: Diagnosis not present

## 2019-07-22 DIAGNOSIS — I25710 Atherosclerosis of autologous vein coronary artery bypass graft(s) with unstable angina pectoris: Secondary | ICD-10-CM | POA: Diagnosis not present

## 2019-07-22 DIAGNOSIS — I714 Abdominal aortic aneurysm, without rupture: Secondary | ICD-10-CM | POA: Diagnosis not present

## 2019-07-22 DIAGNOSIS — I2089 Other forms of angina pectoris: Secondary | ICD-10-CM

## 2019-07-22 DIAGNOSIS — E1159 Type 2 diabetes mellitus with other circulatory complications: Secondary | ICD-10-CM | POA: Diagnosis not present

## 2019-07-22 DIAGNOSIS — G4733 Obstructive sleep apnea (adult) (pediatric): Secondary | ICD-10-CM | POA: Diagnosis not present

## 2019-07-22 DIAGNOSIS — R0602 Shortness of breath: Secondary | ICD-10-CM

## 2019-07-22 DIAGNOSIS — K219 Gastro-esophageal reflux disease without esophagitis: Secondary | ICD-10-CM | POA: Diagnosis not present

## 2019-07-22 DIAGNOSIS — Z01818 Encounter for other preprocedural examination: Secondary | ICD-10-CM | POA: Diagnosis not present

## 2019-07-22 DIAGNOSIS — I739 Peripheral vascular disease, unspecified: Secondary | ICD-10-CM | POA: Diagnosis not present

## 2019-07-22 DIAGNOSIS — J439 Emphysema, unspecified: Secondary | ICD-10-CM | POA: Diagnosis not present

## 2019-07-25 ENCOUNTER — Other Ambulatory Visit: Payer: Self-pay

## 2019-07-25 ENCOUNTER — Encounter
Admission: RE | Admit: 2019-07-25 | Discharge: 2019-07-25 | Disposition: A | Discharge status: Home / Self Care | Payer: PPO | Source: Ambulatory Visit | Attending: Internal Medicine | Admitting: Internal Medicine

## 2019-07-25 DIAGNOSIS — R0602 Shortness of breath: Secondary | ICD-10-CM | POA: Insufficient documentation

## 2019-07-25 DIAGNOSIS — I208 Other forms of angina pectoris: Secondary | ICD-10-CM

## 2019-07-25 MED ORDER — TECHNETIUM TC 99M TETROFOSMIN IV KIT
32.1200 | PACK | Freq: Once | INTRAVENOUS | Status: AC | PRN
  Administered 2019-07-25: 11:00:00 32.12 via INTRAVENOUS

## 2019-07-25 MED ORDER — REGADENOSON 0.4 MG/5ML IV SOLN
0.4000 mg | Freq: Once | INTRAVENOUS | Status: AC
  Administered 2019-07-25: 0.4 mg via INTRAVENOUS
  Filled 2019-07-25: qty 5

## 2019-07-25 MED ORDER — TECHNETIUM TC 99M TETROFOSMIN IV KIT
10.8420 | PACK | Freq: Once | INTRAVENOUS | Status: AC | PRN
  Administered 2019-07-25: 09:00:00 10.842 via INTRAVENOUS

## 2019-07-28 ENCOUNTER — Ambulatory Visit
Admission: RE | Admit: 2019-07-28 | Discharge: 2019-07-28 | Disposition: A | Discharge status: Home / Self Care | Payer: PPO | Source: Ambulatory Visit | Attending: Internal Medicine | Admitting: Internal Medicine

## 2019-07-28 ENCOUNTER — Other Ambulatory Visit: Payer: Self-pay

## 2019-07-28 DIAGNOSIS — I252 Old myocardial infarction: Secondary | ICD-10-CM | POA: Insufficient documentation

## 2019-07-28 DIAGNOSIS — R0602 Shortness of breath: Secondary | ICD-10-CM | POA: Diagnosis not present

## 2019-07-28 DIAGNOSIS — Z8673 Personal history of transient ischemic attack (TIA), and cerebral infarction without residual deficits: Secondary | ICD-10-CM | POA: Insufficient documentation

## 2019-07-28 DIAGNOSIS — G4730 Sleep apnea, unspecified: Secondary | ICD-10-CM | POA: Insufficient documentation

## 2019-07-28 DIAGNOSIS — I2510 Atherosclerotic heart disease of native coronary artery without angina pectoris: Secondary | ICD-10-CM | POA: Diagnosis not present

## 2019-07-28 LAB — NM MYOCAR MULTI W/SPECT W/WALL MOTION / EF
Estimated workload: 1 METS
Exercise duration (min): 0 min
Exercise duration (sec): 56 s
LV dias vol: 197 mL (ref 62–150)
LV sys vol: 106 mL
Peak HR: 90 {beats}/min
Percent HR: 63 %
Rest HR: 72 {beats}/min
SDS: 6
SRS: 14
SSS: 17
TID: 1.02

## 2019-07-28 NOTE — Progress Notes (Signed)
*  PRELIMINARY RESULTS* Echocardiogram 2D Echocardiogram has been performed.  Sherrie Sport 07/28/2019, 10:21 AM

## 2019-07-31 ENCOUNTER — Other Ambulatory Visit: Payer: PPO

## 2019-08-08 ENCOUNTER — Other Ambulatory Visit: Payer: Self-pay | Admitting: Orthopedic Surgery

## 2019-08-11 ENCOUNTER — Encounter
Admission: RE | Admit: 2019-08-11 | Discharge: 2019-08-11 | Disposition: A | Discharge status: Home / Self Care | Payer: PPO | Source: Ambulatory Visit | Attending: Orthopedic Surgery | Admitting: Orthopedic Surgery

## 2019-08-11 ENCOUNTER — Other Ambulatory Visit: Payer: Self-pay

## 2019-08-11 DIAGNOSIS — Z01812 Encounter for preprocedural laboratory examination: Secondary | ICD-10-CM | POA: Insufficient documentation

## 2019-08-11 DIAGNOSIS — E66811 Obesity, class 1: Secondary | ICD-10-CM

## 2019-08-11 DIAGNOSIS — E669 Obesity, unspecified: Secondary | ICD-10-CM

## 2019-08-11 HISTORY — DX: Obesity, unspecified: E66.9

## 2019-08-11 HISTORY — DX: Dyspnea, unspecified: R06.00

## 2019-08-11 HISTORY — DX: Unspecified osteoarthritis, unspecified site: M19.90

## 2019-08-11 HISTORY — DX: Other specified postprocedural states: R11.2

## 2019-08-11 HISTORY — DX: Other complications of anesthesia, initial encounter: T88.59XA

## 2019-08-11 HISTORY — DX: Other specified postprocedural states: Z98.890

## 2019-08-11 HISTORY — DX: Anxiety disorder, unspecified: F41.9

## 2019-08-11 HISTORY — DX: Obesity, class 1: E66.811

## 2019-08-11 NOTE — Patient Instructions (Signed)
INSTRUCTIONS FOR SURGERY     Your surgery is scheduled for:   Thursday, MAY 13th     To find out your arrival time for the day of surgery,          please call [phone] between 1 pm and 3 pm on :  Wednesday, MAY 12th     When you arrive for surgery, report to the [place].       Do NOT stop on the first floor to register.    REMEMBER: Instructions that are not followed completely may result in serious medical risk,  up to and including death, or upon the discretion of your surgeon and anesthesiologist,            your surgery may need to be rescheduled.  __X__ 1. Do not eat food after midnight the night before your procedure.                    No gum, candy, lozenger, tic tacs, tums or hard candies.                  ABSOLUTELY NOTHING SOLID IN YOUR MOUTH AFTER MIDNIGHT                    You may drink unlimited clear liquids up to 2 hours before you are scheduled to arrive for surgery.                   Do not drink anything within those 2 hours unless you need to take medicine, then take the                   smallest amount you need.  Clear liquids include:  water, apple juice without pulp,                   any flavor Gatorade, Black coffee, black tea.  Sugar may be added but no dairy/ honey /lemon.                        Broth and jello is not considered a clear liquid.  __x__  2. On the morning of surgery, please brush your teeth with toothpaste and water. You may rinse with                  mouthwash if you wish but DO NOT SWALLOW TOOTHPASTE OR MOUTHWASH  __X___3. NO alcohol for 24 hours before or after surgery.  __x___ 4.  Do NOT smoke or use e-cigarettes for 24 HOURS PRIOR TO SURGERY.                      DO NOT Use any chewable tobacco products for at least 6 hours prior to surgery.  __x___ 5. If you start any new medication after this appointment and prior to surgery, please    Bring it with you on the day of surgery.  ___x__ 6. Notify your doctor if there is any change in your medical condition, such as fever,  infection, vomitting, diarrhea or any open sores.  __x___ 7.  USE the CHG SOAP as instructed, the night before surgery and the day of surgery.                   Once you have washed with this soap, do NOT use any of the following: Powders, perfumes                    or lotions. Please do not wear make up, hairpins, clips or nail polish. You MAY wear deodorant.                   Men may shave their face and neck.  Women need to shave 48 hours prior to surgery.                   DO NOT wear ANY jewelry on the day of surgery. If there are rings that are too tight to                    remove easily, please address this prior to the surgery day. Piercings need to be removed.                                                                     NO METAL ON YOUR BODY.                    Do NOT bring any valuables.  If you came to Pre-Admit testing then you will not need license,                     insurance card or credit card.  If you will be staying overnight, please either leave your things in                     the car or have your family be responsible for these items.                     [facility] IS NOT RESPONSIBLE FOR BELONGINGS OR VALUABLES.  ___X__ 8. DO NOT wear contact lenses on surgery day.  You may not have dentures,                     Hearing aides, contacts or glasses in the operating room. These items can be                    Placed in the Recovery Room to receive immediately after surgery.  ___x__ 10. Take the following medications on the morning of surgery with a sip of water:                              1. ALLOPURINOL                     2. ASTELIN NASAL SPRAY                     3. COREG  4. SYNTHROID                     5. PROTONIX(take night before and again am of surgery)                      6.  __x___ 11.  Follow any instructions provided to you by your surgeon.                        Such as enema, clear liquid bowel prep                   ##CONSUME THE CARBOHYDRATE DRINK ON THE MORNING OF SURGERY.                     HAVE IT COMPLETED 2 HOURS PRIOR TO ARRIVAL.##  __X__  12. STOP PLAVIX / ASPIRIN AS OF:  ???                       Will send note to dr. Clayborn Bigness to determine when or if to stop these two medicines.                    Please call his office so you are instructed regarding these medicines as well.                       THIS INCLUDES BC POWDERS / GOODIES POWDER  __x___ 13. STOP Anti-inflammatories as of: one week prior to surgery.                      This includes IBUPROFEN / MOTRIN / ADVIL / ALEVE/ NAPROXYN                    YOU MAY TAKE TYLENOL ANY TIME PRIOR TO SURGERY.  __x___ 15. Bring your CPAP machine into preop with you on the morning of surgery.  _x_____17.  Continue to take the following medications but do not take on the morning of surgery:                         VITAMIN B12 // IRON // LASIX // POTASSIUM  __X____18. If staying overnight, please have appropriate shoes to wear to be able to walk around the unit.                   Wear clean and comfortable clothing to the hospital.  CONTINUE TAKING YOUR EVENING MEDICATIONS AS USUAL. BRING A PHONE CHARGER IF YOU USE A CELL PHONE. HAVE PHONE NUMBERS WITH YOU FOR CONTACT PEOPLE.

## 2019-08-12 ENCOUNTER — Ambulatory Visit
Admission: RE | Admit: 2019-08-12 | Discharge: 2019-08-12 | Disposition: A | Discharge status: Home / Self Care | Payer: PPO | Source: Ambulatory Visit | Attending: Family Medicine | Admitting: Family Medicine

## 2019-08-12 ENCOUNTER — Other Ambulatory Visit: Payer: Self-pay | Admitting: Family Medicine

## 2019-08-12 DIAGNOSIS — M25442 Effusion, left hand: Secondary | ICD-10-CM | POA: Insufficient documentation

## 2019-08-12 DIAGNOSIS — R2232 Localized swelling, mass and lump, left upper limb: Secondary | ICD-10-CM | POA: Diagnosis not present

## 2019-08-12 DIAGNOSIS — M1611 Unilateral primary osteoarthritis, right hip: Secondary | ICD-10-CM | POA: Diagnosis not present

## 2019-08-12 DIAGNOSIS — M7989 Other specified soft tissue disorders: Secondary | ICD-10-CM | POA: Diagnosis not present

## 2019-08-12 DIAGNOSIS — M25532 Pain in left wrist: Secondary | ICD-10-CM | POA: Diagnosis not present

## 2019-08-13 ENCOUNTER — Encounter
Admission: RE | Admit: 2019-08-13 | Discharge: 2019-08-13 | Disposition: A | Discharge status: Home / Self Care | Payer: PPO | Source: Ambulatory Visit | Attending: Orthopedic Surgery | Admitting: Orthopedic Surgery

## 2019-08-13 ENCOUNTER — Other Ambulatory Visit: Payer: Self-pay

## 2019-08-13 DIAGNOSIS — Z01812 Encounter for preprocedural laboratory examination: Secondary | ICD-10-CM | POA: Diagnosis not present

## 2019-08-13 LAB — CBC WITH DIFFERENTIAL/PLATELET
Abs Immature Granulocytes: 0.03 10*3/uL (ref 0.00–0.07)
Basophils Absolute: 0.1 10*3/uL (ref 0.0–0.1)
Basophils Relative: 1 %
Eosinophils Absolute: 0.3 10*3/uL (ref 0.0–0.5)
Eosinophils Relative: 4 %
HCT: 39.5 % (ref 39.0–52.0)
Hemoglobin: 12.6 g/dL — ABNORMAL LOW (ref 13.0–17.0)
Immature Granulocytes: 0 %
Lymphocytes Relative: 14 %
Lymphs Abs: 1.1 10*3/uL (ref 0.7–4.0)
MCH: 30.6 pg (ref 26.0–34.0)
MCHC: 31.9 g/dL (ref 30.0–36.0)
MCV: 95.9 fL (ref 80.0–100.0)
Monocytes Absolute: 0.7 10*3/uL (ref 0.1–1.0)
Monocytes Relative: 9 %
Neutro Abs: 5.4 10*3/uL (ref 1.7–7.7)
Neutrophils Relative %: 72 %
Platelets: 247 10*3/uL (ref 150–400)
RBC: 4.12 MIL/uL — ABNORMAL LOW (ref 4.22–5.81)
RDW: 13.2 % (ref 11.5–15.5)
WBC: 7.6 10*3/uL (ref 4.0–10.5)
nRBC: 0 % (ref 0.0–0.2)

## 2019-08-13 LAB — URINALYSIS, ROUTINE W REFLEX MICROSCOPIC
Bilirubin Urine: NEGATIVE
Glucose, UA: NEGATIVE mg/dL
Hgb urine dipstick: NEGATIVE
Ketones, ur: NEGATIVE mg/dL
Leukocytes,Ua: NEGATIVE
Nitrite: NEGATIVE
Protein, ur: NEGATIVE mg/dL
Specific Gravity, Urine: 1.01 (ref 1.005–1.030)
pH: 7 (ref 5.0–8.0)

## 2019-08-13 LAB — COMPREHENSIVE METABOLIC PANEL
ALT: 13 U/L (ref 0–44)
AST: 11 U/L — ABNORMAL LOW (ref 15–41)
Albumin: 3.4 g/dL — ABNORMAL LOW (ref 3.5–5.0)
Alkaline Phosphatase: 49 U/L (ref 38–126)
Anion gap: 7 (ref 5–15)
BUN: 14 mg/dL (ref 8–23)
CO2: 29 mmol/L (ref 22–32)
Calcium: 8.7 mg/dL — ABNORMAL LOW (ref 8.9–10.3)
Chloride: 97 mmol/L — ABNORMAL LOW (ref 98–111)
Creatinine, Ser: 0.85 mg/dL (ref 0.61–1.24)
GFR calc Af Amer: >60 mL/min (ref >60)
GFR calc non Af Amer: >60 mL/min (ref >60)
Glucose, Bld: 118 mg/dL — ABNORMAL HIGH (ref 70–99)
Potassium: 4.2 mmol/L (ref 3.5–5.1)
Sodium: 133 mmol/L — ABNORMAL LOW (ref 135–145)
Total Bilirubin: 1.1 mg/dL (ref 0.3–1.2)
Total Protein: 6.2 g/dL — ABNORMAL LOW (ref 6.5–8.1)

## 2019-08-13 LAB — TYPE AND SCREEN
ABO/RH(D): O POS
Antibody Screen: NEGATIVE

## 2019-08-13 LAB — SURGICAL PCR SCREEN
MRSA, PCR: NEGATIVE
Staphylococcus aureus: POSITIVE — AB

## 2019-08-19 ENCOUNTER — Other Ambulatory Visit
Admission: RE | Admit: 2019-08-19 | Discharge: 2019-08-19 | Disposition: A | Discharge status: Home / Self Care | Payer: PPO | Source: Ambulatory Visit | Attending: Orthopedic Surgery | Admitting: Orthopedic Surgery

## 2019-08-19 DIAGNOSIS — G4733 Obstructive sleep apnea (adult) (pediatric): Secondary | ICD-10-CM | POA: Diagnosis not present

## 2019-08-19 DIAGNOSIS — I714 Abdominal aortic aneurysm, without rupture: Secondary | ICD-10-CM | POA: Diagnosis present

## 2019-08-19 DIAGNOSIS — Z8521 Personal history of malignant neoplasm of larynx: Secondary | ICD-10-CM | POA: Diagnosis not present

## 2019-08-19 DIAGNOSIS — D649 Anemia, unspecified: Secondary | ICD-10-CM | POA: Diagnosis present

## 2019-08-19 DIAGNOSIS — I252 Old myocardial infarction: Secondary | ICD-10-CM | POA: Diagnosis not present

## 2019-08-19 DIAGNOSIS — Z01812 Encounter for preprocedural laboratory examination: Secondary | ICD-10-CM | POA: Insufficient documentation

## 2019-08-19 DIAGNOSIS — R05 Cough: Secondary | ICD-10-CM | POA: Diagnosis present

## 2019-08-19 DIAGNOSIS — Z87891 Personal history of nicotine dependence: Secondary | ICD-10-CM | POA: Diagnosis not present

## 2019-08-19 DIAGNOSIS — K219 Gastro-esophageal reflux disease without esophagitis: Secondary | ICD-10-CM | POA: Diagnosis present

## 2019-08-19 DIAGNOSIS — M4800 Spinal stenosis, site unspecified: Secondary | ICD-10-CM | POA: Diagnosis present

## 2019-08-19 DIAGNOSIS — K5790 Diverticulosis of intestine, part unspecified, without perforation or abscess without bleeding: Secondary | ICD-10-CM | POA: Diagnosis present

## 2019-08-19 DIAGNOSIS — I10 Essential (primary) hypertension: Secondary | ICD-10-CM | POA: Diagnosis present

## 2019-08-19 DIAGNOSIS — F419 Anxiety disorder, unspecified: Secondary | ICD-10-CM | POA: Diagnosis present

## 2019-08-19 DIAGNOSIS — Z8673 Personal history of transient ischemic attack (TIA), and cerebral infarction without residual deficits: Secondary | ICD-10-CM | POA: Diagnosis not present

## 2019-08-19 DIAGNOSIS — M7061 Trochanteric bursitis, right hip: Secondary | ICD-10-CM | POA: Diagnosis present

## 2019-08-19 DIAGNOSIS — Z471 Aftercare following joint replacement surgery: Secondary | ICD-10-CM | POA: Diagnosis not present

## 2019-08-19 DIAGNOSIS — I25810 Atherosclerosis of coronary artery bypass graft(s) without angina pectoris: Secondary | ICD-10-CM | POA: Diagnosis present

## 2019-08-19 DIAGNOSIS — Z86010 Personal history of colonic polyps: Secondary | ICD-10-CM | POA: Diagnosis not present

## 2019-08-19 DIAGNOSIS — G4730 Sleep apnea, unspecified: Secondary | ICD-10-CM | POA: Diagnosis present

## 2019-08-19 DIAGNOSIS — E119 Type 2 diabetes mellitus without complications: Secondary | ICD-10-CM | POA: Diagnosis not present

## 2019-08-19 DIAGNOSIS — J449 Chronic obstructive pulmonary disease, unspecified: Secondary | ICD-10-CM | POA: Diagnosis present

## 2019-08-19 DIAGNOSIS — M1611 Unilateral primary osteoarthritis, right hip: Secondary | ICD-10-CM | POA: Diagnosis present

## 2019-08-19 DIAGNOSIS — Z6835 Body mass index (BMI) 35.0-35.9, adult: Secondary | ICD-10-CM | POA: Diagnosis not present

## 2019-08-19 DIAGNOSIS — M25532 Pain in left wrist: Secondary | ICD-10-CM | POA: Diagnosis not present

## 2019-08-19 DIAGNOSIS — E782 Mixed hyperlipidemia: Secondary | ICD-10-CM | POA: Diagnosis not present

## 2019-08-19 DIAGNOSIS — E669 Obesity, unspecified: Secondary | ICD-10-CM | POA: Diagnosis present

## 2019-08-19 DIAGNOSIS — Z96641 Presence of right artificial hip joint: Secondary | ICD-10-CM | POA: Diagnosis not present

## 2019-08-19 DIAGNOSIS — I2510 Atherosclerotic heart disease of native coronary artery without angina pectoris: Secondary | ICD-10-CM | POA: Diagnosis present

## 2019-08-19 DIAGNOSIS — Z20822 Contact with and (suspected) exposure to covid-19: Secondary | ICD-10-CM | POA: Diagnosis present

## 2019-08-19 DIAGNOSIS — M109 Gout, unspecified: Secondary | ICD-10-CM | POA: Diagnosis present

## 2019-08-19 DIAGNOSIS — E039 Hypothyroidism, unspecified: Secondary | ICD-10-CM | POA: Diagnosis present

## 2019-08-19 LAB — SARS CORONAVIRUS 2 (TAT 6-24 HRS): SARS Coronavirus 2: NEGATIVE

## 2019-08-20 NOTE — H&P (Signed)
Chief Complaint  Patient presents with  . Follow-up  OA Rt Hip   History of the Present Illness: Jesse Allison is a 79 y.o. male here for evaluation of right hip pain. He has previously seen Rachelle Hora and was given a brochure regarding hip arthroplasty. He had x-rays obtained on 06/18/2019 showing sclerosis of the femoral head, severe central hip osteoarthritis, as well as subchondral cyst formation in the femoral head, osteophytes and possibly avascular necrosis. The patient presents today to discuss right total hip arthroplasty.  The patient states he has groin pain that radiates to his right knee. He states he has had back pain for 20 years. The pain is also located on the lateral side of his hip and radiates to the buttock. He states the pain started in 02/2019. The pain awakens him at night. The patient states he has not been able to walk through a grocery store since 02/2019, and prior to this he had done 100 percent of the shopping. The patient previously attending the exercise program at [facility] 3 days a week. He was able to walk to the mailbox prior to the onset of pain. He has received injections from Dr. Sharlet Salina for 2 years or more. He believes he has received at least 6 right hip injections, the last being in 03/2019. He presents using a walker, and says he always uses a walker for ambulation.   No history of diabetes or DVT. The patient has undergone a heart surgery, aortic aneurysm surgery, and laryngeal surgery. The patient's cardiologist is Dwayne D. Clayborn Bigness, MD. His primary care physician is Ramonita Lab, III, MD. He also has sleep apnea.   I have reviewed past medical, surgical, social and family history, and allergies as documented in the EMR.  Past Medical History: Past Medical History:  Diagnosis Date  . Abdominal aortic aneurysm (AAA) without rupture (CMS-HCC) 02/07/2016  4.5 cm 2017; followed by Dr. Haynes Kerns  . Anemia, iron deficiency 03/07/2015  Low ferritin  11/16. Hemoccults given. Trial of oral iron therapy  . Anxiety  . Benign neoplasm of colon  . COPD (chronic obstructive pulmonary disease) (CMS-HCC)  . Coronary atherosclerosis of autologous vein bypass graft  . Coronary atherosclerosis of native coronary artery  . Diverticulosis  . Edema  . Essential hypertension, benign  . GERD (gastroesophageal reflux disease)  . Gout, joint  . Hemorrhage of rectum and anus  . Laryngeal squamous cell carcinoma (CMS-HCC) 2012  status post surgery and xrt  . Obesity  . Other and unspecified hyperlipidemia  . Other symptoms involving digestive system(787.99)  . PVD (peripheral vascular disease) (CMS-HCC)  s/p endovascular repair of AAA; followed by Dr. Haynes Kerns  . Senile purpura (CMS-HCC) 02/08/2016  . Sleep apnea  . SOB (shortness of breath)  . Spinal stenosis  . Type II or unspecified type diabetes mellitus without mention of complication, not stated as uncontrolled (CMS-HCC)  . Vitamin B12 deficiency   Past Surgical History: Past Surgical History:  Procedure Laterality Date  . Back surgery L4 09/1998  . CABG 09/1991  . COLONOSCOPY 05/22/1997  Hyperplastic Polyps  . COLONOSCOPY 01/07/2009, 08/25/2004  Adenomatous Polyps  . COLONOSCOPY 04/29/2012  PH Adenomatous Polyps: CBF 04/2017; Recall Ltr mailed 03/30/2017 (dh)  . ENDOVASCULAR REPAIR INFRARENAL ABDOMINAL AORTIC ANEURYSM 03/26/2007  [facility] DR. MARSTON  . EXCISION NASAL POLYPS  . EXCISION SPERMATOCELE 09/2009  WITH SUBSEQUENT HEMATOMA  . RESECTION OF LARYNGEAL  CANCER, DR. Richardson Landry   Past Family History: Family History  Family history  unknown: Yes   Medications: Current Outpatient Medications Ordered in Epic  Medication Sig Dispense Refill  . albuterol 90 mcg/actuation inhaler 2 puffs q.i.d. p.r.n. short of breath, wheezing, or cough 1 Inhaler 0  . allopurinoL (ZYLOPRIM) 300 MG tablet TAKE 1 TABLET BY MOUTH ONCE A DAY 90 tablet 3  . aspirin 81 MG EC tablet Take 81 mg by mouth once  daily.  . busPIRone (BUSPAR) 10 MG tablet Take 1 tablet (10 mg total) by mouth 2 (two) times daily as needed (anxiety) 40 tablet 5  . CALCIUM CARBONATE/VITAMIN D3 (CALCIUM 500 + D ORAL) Take 1 caplet by mouth once daily.   . carvediloL (COREG) 25 MG tablet TAKE ONE-HALF (1/2) TABLET TWICE DAILY 90 tablet 1  . clopidogreL (PLAVIX) 75 mg tablet TAKE 1 TABLET BY MOUTH ONCE A DAY 90 tablet 3  . cyanocobalamin (VITAMIN B12) 1000 MCG tablet Take 1,000 mcg by mouth once daily.  . ERGOCALCIFEROL, VITAMIN D2, (VITAMIN D3 ORAL) Take 400 Int'l Units by mouth. One daily  . FUROsemide (LASIX) 40 MG tablet TAKE 1 TABLET BY MOUTH ONCE DAILY 90 tablet 0  . iron-mfolate-B12-C-biot-Zn-dss 160 mg iron-1 mg-60 mcg Tab Take 1 tablet by mouth once daily.  Marland Kitchen levothyroxine (SYNTHROID) 50 MCG tablet TAKE 1 TABLET BY MOUTH ONCE A DAY. TAKE ON AN EMPTY STOMACH WITH A GLASS OF WATER ATLEAST 30-60 MIN BEFORE BREAKFAST 90 tablet 3  . losartan (COZAAR) 50 MG tablet TAKE 1 TABLET BY MOUTH ONCE A DAY 90 tablet 1  . lovastatin (MEVACOR) 20 MG tablet TAKE 1 TABLET BY MOUTH ONCE A DAY WITH DINNER 90 tablet 1  . nitroGLYcerin (NITROSTAT) 0.4 MG SL tablet Place 1 tablet (0.4 mg total) under the tongue every 5 (five) minutes as needed for Chest pain May take up to 3 doses. 25 tablet 1  . pantoprazole (PROTONIX) 40 MG DR tablet TAKE 1 TABLET BY MOUTH ONCE A DAY 90 tablet 1  . potassium chloride (KLOR-CON) 10 MEQ ER tablet TAKE 1 TABLET BY MOUTH ONCE DAILY 90 tablet 1  . traMADoL (ULTRAM) 50 mg tablet TAKE 1 TABLET BY MOUTH 2 TIMES DAILY AS NEEDED 60 tablet 0   No current Epic-ordered facility-administered medications on file.   Allergies: Allergies  Allergen Reactions  . Niacin Other (See Comments)  Leg cramps  . Prednisone Unknown    Body mass index is 35.57 kg/m.  Review of Systems: A comprehensive 14 point ROS was performed, reviewed, and the pertinent orthopaedic findings are documented in the HPI.  Vitals:   07/09/19 0800  BP: 156/78   General Physical Examination:  General/Constitutional: No apparent distress: well-nourished and well developed. Eyes: Pupils equal, round with synchronous movement. Lungs: Clear to auscultation HEENT: Normal Vascular: No edema, swelling or tenderness, except as noted in detailed exam. Cardiac: Heart rate and rhythm is regular. Integumentary: No impressive skin lesions present, except as noted in detailed exam. Neuro/Psych: Normal mood and affect, oriented to person, place and time.  Musculoskeletal Examination: On exam, antalgic gait .He has 5 degrees of internal rotation, 30 degrees external rotation on the right, 20 degrees internal rotation, and 50 degrees external rotation on the left. Palpable dorsalis pedis and posterior tibial pulse. Sensation intact to right lower extremity. Contusion to the right hip from his last injection. Lungs are clear. Heart rate and rhythm is normal. HEENT is normal. Teeth are remarkable for lower partial and 2 upper bridges.   Radiographs: No new imaging studies were obtained or reviewed today.  Assessment: ICD-10-CM  1. Primary osteoarthritis of right hip M16.11  2. Trochanteric bursitis of right hip M70.61   Plan: The patient has clinical findings of severe right central hip osteoarthritis.  We discussed the patient's prior x-ray findings. We discussed hip arthroplasty and the postoperative course in detail. The patient would like to proceed with right hip arthroplasty. Dr Clayborn Bigness has performed optimization of cardiac condition.  Surgical Risks:  The nature of the condition and the proposed procedure has been reviewed in detail with the patient. Surgical versus non-surgical options and prognosis for recovery have been reviewed and the inherent risks and benefits of each have been discussed including the risks of infection, bleeding, injury to nerves/blood vessels/tendons, incomplete relief of symptoms, persisting pain  and/or stiffness, loss of function, complex regional pain syndrome, failure of the procedure, as appropriate.  Teeth: Partial lower.   Scribe Attestation: I, Dawn Royse, am acting as scribe for [employer], MD  Reviewed paper H+P, will be scanned into chart. No changes noted.

## 2019-08-21 ENCOUNTER — Encounter
Admission: RE | Disposition: A | Discharge status: Home / Self Care | Payer: Self-pay | Source: Home / Self Care | Attending: Orthopedic Surgery

## 2019-08-21 ENCOUNTER — Encounter: Payer: Self-pay | Admitting: Orthopedic Surgery

## 2019-08-21 ENCOUNTER — Other Ambulatory Visit: Payer: Self-pay

## 2019-08-21 ENCOUNTER — Inpatient Hospital Stay: Payer: PPO | Admitting: Anesthesiology

## 2019-08-21 ENCOUNTER — Inpatient Hospital Stay
Admission: RE | Admit: 2019-08-21 | Discharge: 2019-08-24 | DRG: 470 | Disposition: A | Discharge status: Home Health | Payer: PPO | Attending: Orthopedic Surgery | Admitting: Orthopedic Surgery

## 2019-08-21 ENCOUNTER — Inpatient Hospital Stay: Payer: PPO

## 2019-08-21 DIAGNOSIS — I10 Essential (primary) hypertension: Secondary | ICD-10-CM | POA: Diagnosis present

## 2019-08-21 DIAGNOSIS — K5790 Diverticulosis of intestine, part unspecified, without perforation or abscess without bleeding: Secondary | ICD-10-CM | POA: Diagnosis present

## 2019-08-21 DIAGNOSIS — I2510 Atherosclerotic heart disease of native coronary artery without angina pectoris: Secondary | ICD-10-CM | POA: Diagnosis present

## 2019-08-21 DIAGNOSIS — I739 Peripheral vascular disease, unspecified: Secondary | ICD-10-CM | POA: Diagnosis present

## 2019-08-21 DIAGNOSIS — M1611 Unilateral primary osteoarthritis, right hip: Principal | ICD-10-CM | POA: Diagnosis present

## 2019-08-21 DIAGNOSIS — Z20822 Contact with and (suspected) exposure to covid-19: Secondary | ICD-10-CM | POA: Diagnosis present

## 2019-08-21 DIAGNOSIS — Z96641 Presence of right artificial hip joint: Secondary | ICD-10-CM

## 2019-08-21 DIAGNOSIS — Z419 Encounter for procedure for purposes other than remedying health state, unspecified: Secondary | ICD-10-CM

## 2019-08-21 DIAGNOSIS — Z8673 Personal history of transient ischemic attack (TIA), and cerebral infarction without residual deficits: Secondary | ICD-10-CM

## 2019-08-21 DIAGNOSIS — M109 Gout, unspecified: Secondary | ICD-10-CM | POA: Diagnosis present

## 2019-08-21 DIAGNOSIS — Z6835 Body mass index (BMI) 35.0-35.9, adult: Secondary | ICD-10-CM | POA: Diagnosis not present

## 2019-08-21 DIAGNOSIS — Z79899 Other long term (current) drug therapy: Secondary | ICD-10-CM

## 2019-08-21 DIAGNOSIS — E669 Obesity, unspecified: Secondary | ICD-10-CM | POA: Diagnosis present

## 2019-08-21 DIAGNOSIS — M7061 Trochanteric bursitis, right hip: Secondary | ICD-10-CM | POA: Diagnosis present

## 2019-08-21 DIAGNOSIS — Z7982 Long term (current) use of aspirin: Secondary | ICD-10-CM

## 2019-08-21 DIAGNOSIS — I25810 Atherosclerosis of coronary artery bypass graft(s) without angina pectoris: Secondary | ICD-10-CM | POA: Diagnosis present

## 2019-08-21 DIAGNOSIS — E039 Hypothyroidism, unspecified: Secondary | ICD-10-CM | POA: Diagnosis present

## 2019-08-21 DIAGNOSIS — I252 Old myocardial infarction: Secondary | ICD-10-CM | POA: Diagnosis not present

## 2019-08-21 DIAGNOSIS — J449 Chronic obstructive pulmonary disease, unspecified: Secondary | ICD-10-CM | POA: Diagnosis present

## 2019-08-21 DIAGNOSIS — D649 Anemia, unspecified: Secondary | ICD-10-CM | POA: Diagnosis present

## 2019-08-21 DIAGNOSIS — G4730 Sleep apnea, unspecified: Secondary | ICD-10-CM | POA: Diagnosis present

## 2019-08-21 DIAGNOSIS — I714 Abdominal aortic aneurysm, without rupture: Secondary | ICD-10-CM | POA: Diagnosis present

## 2019-08-21 DIAGNOSIS — Z86010 Personal history of colonic polyps: Secondary | ICD-10-CM | POA: Diagnosis not present

## 2019-08-21 DIAGNOSIS — E538 Deficiency of other specified B group vitamins: Secondary | ICD-10-CM | POA: Diagnosis present

## 2019-08-21 DIAGNOSIS — Z471 Aftercare following joint replacement surgery: Secondary | ICD-10-CM | POA: Diagnosis not present

## 2019-08-21 DIAGNOSIS — Z8521 Personal history of malignant neoplasm of larynx: Secondary | ICD-10-CM | POA: Diagnosis not present

## 2019-08-21 DIAGNOSIS — R05 Cough: Secondary | ICD-10-CM | POA: Diagnosis present

## 2019-08-21 DIAGNOSIS — Z888 Allergy status to other drugs, medicaments and biological substances status: Secondary | ICD-10-CM

## 2019-08-21 DIAGNOSIS — K219 Gastro-esophageal reflux disease without esophagitis: Secondary | ICD-10-CM | POA: Diagnosis present

## 2019-08-21 DIAGNOSIS — Z87891 Personal history of nicotine dependence: Secondary | ICD-10-CM | POA: Diagnosis not present

## 2019-08-21 DIAGNOSIS — Z7902 Long term (current) use of antithrombotics/antiplatelets: Secondary | ICD-10-CM

## 2019-08-21 DIAGNOSIS — M4800 Spinal stenosis, site unspecified: Secondary | ICD-10-CM | POA: Diagnosis present

## 2019-08-21 DIAGNOSIS — G8918 Other acute postprocedural pain: Secondary | ICD-10-CM

## 2019-08-21 DIAGNOSIS — F419 Anxiety disorder, unspecified: Secondary | ICD-10-CM | POA: Diagnosis present

## 2019-08-21 DIAGNOSIS — Z79890 Hormone replacement therapy (postmenopausal): Secondary | ICD-10-CM

## 2019-08-21 DIAGNOSIS — Z885 Allergy status to narcotic agent status: Secondary | ICD-10-CM

## 2019-08-21 DIAGNOSIS — E7800 Pure hypercholesterolemia, unspecified: Secondary | ICD-10-CM | POA: Diagnosis present

## 2019-08-21 HISTORY — PX: TOTAL HIP ARTHROPLASTY: SHX124

## 2019-08-21 LAB — BASIC METABOLIC PANEL
Anion gap: 7 (ref 5–15)
BUN: 13 mg/dL (ref 8–23)
CO2: 26 mmol/L (ref 22–32)
Calcium: 8.6 mg/dL — ABNORMAL LOW (ref 8.9–10.3)
Chloride: 101 mmol/L (ref 98–111)
Creatinine, Ser: 0.82 mg/dL (ref 0.61–1.24)
GFR calc Af Amer: >60 mL/min (ref >60)
GFR calc non Af Amer: >60 mL/min (ref >60)
Glucose, Bld: 179 mg/dL — ABNORMAL HIGH (ref 70–99)
Potassium: 4.7 mmol/L (ref 3.5–5.1)
Sodium: 134 mmol/L — ABNORMAL LOW (ref 135–145)

## 2019-08-21 LAB — ABO/RH: ABO/RH(D): O POS

## 2019-08-21 SURGERY — ARTHROPLASTY, HIP, TOTAL, ANTERIOR APPROACH
Anesthesia: General | Site: Hip | Laterality: Right

## 2019-08-21 MED ORDER — PANTOPRAZOLE SODIUM 40 MG PO TBEC
40.0000 mg | DELAYED_RELEASE_TABLET | Freq: Every day | ORAL | Status: DC
  Administered 2019-08-22 – 2019-08-23 (×2): 40 mg via ORAL
  Filled 2019-08-21 (×2): qty 1

## 2019-08-21 MED ORDER — CARVEDILOL 12.5 MG PO TABS
12.5000 mg | ORAL_TABLET | Freq: Two times a day (BID) | ORAL | Status: DC
  Administered 2019-08-21 – 2019-08-24 (×6): 12.5 mg via ORAL
  Filled 2019-08-21 (×2): qty 4
  Filled 2019-08-21 (×4): qty 1

## 2019-08-21 MED ORDER — LIDOCAINE HCL (CARDIAC) PF 100 MG/5ML IV SOSY
PREFILLED_SYRINGE | INTRAVENOUS | Status: DC | PRN
  Administered 2019-08-21: 100 mg via INTRAVENOUS

## 2019-08-21 MED ORDER — ACETAMINOPHEN 10 MG/ML IV SOLN: 1000.0000 mg | Freq: Once | INTRAVENOUS | Status: DC | PRN

## 2019-08-21 MED ORDER — ASPIRIN 81 MG PO CHEW
81.0000 mg | CHEWABLE_TABLET | Freq: Two times a day (BID) | ORAL | Status: DC
  Administered 2019-08-21 – 2019-08-24 (×6): 81 mg via ORAL
  Filled 2019-08-21 (×6): qty 1

## 2019-08-21 MED ORDER — NEOMYCIN-POLYMYXIN B GU 40-200000 IR SOLN
Status: AC
  Filled 2019-08-21: qty 20

## 2019-08-21 MED ORDER — ALUM & MAG HYDROXIDE-SIMETH 200-200-20 MG/5ML PO SUSP: 30.0000 mL | ORAL | Status: DC | PRN

## 2019-08-21 MED ORDER — PHENYLEPHRINE HCL (PRESSORS) 10 MG/ML IV SOLN
INTRAVENOUS | Status: DC | PRN
  Administered 2019-08-21: 100 ug via INTRAVENOUS

## 2019-08-21 MED ORDER — ONDANSETRON HCL 4 MG/2ML IJ SOLN: 4.0000 mg | Freq: Four times a day (QID) | INTRAMUSCULAR | Status: DC | PRN

## 2019-08-21 MED ORDER — SUGAMMADEX SODIUM 200 MG/2ML IV SOLN
INTRAVENOUS | Status: DC | PRN
  Administered 2019-08-21: 200 mg via INTRAVENOUS

## 2019-08-21 MED ORDER — LACTATED RINGERS IV SOLN: INTRAVENOUS | Status: DC

## 2019-08-21 MED ORDER — LABETALOL HCL 5 MG/ML IV SOLN
5.0000 mg | INTRAVENOUS | Status: AC | PRN
  Administered 2019-08-21 (×3): 5 mg via INTRAVENOUS

## 2019-08-21 MED ORDER — ACETAMINOPHEN 500 MG PO TABS
500.0000 mg | ORAL_TABLET | Freq: Four times a day (QID) | ORAL | Status: AC
  Administered 2019-08-21 (×2): 500 mg via ORAL
  Filled 2019-08-21 (×2): qty 1

## 2019-08-21 MED ORDER — FERROUS SULFATE 325 (65 FE) MG PO TABS
325.0000 mg | ORAL_TABLET | Freq: Every day | ORAL | Status: DC
  Administered 2019-08-21 – 2019-08-24 (×3): 325 mg via ORAL
  Filled 2019-08-21 (×3): qty 1

## 2019-08-21 MED ORDER — FENTANYL CITRATE (PF) 100 MCG/2ML IJ SOLN
INTRAMUSCULAR | Status: AC
  Filled 2019-08-21: qty 2

## 2019-08-21 MED ORDER — BUPIVACAINE-EPINEPHRINE (PF) 0.25% -1:200000 IJ SOLN
INTRAMUSCULAR | Status: AC
  Filled 2019-08-21: qty 30

## 2019-08-21 MED ORDER — TRAMADOL HCL 50 MG PO TABS
50.0000 mg | ORAL_TABLET | Freq: Four times a day (QID) | ORAL | Status: DC
  Administered 2019-08-21 – 2019-08-24 (×12): 50 mg via ORAL
  Filled 2019-08-21 (×12): qty 1

## 2019-08-21 MED ORDER — METOCLOPRAMIDE HCL 10 MG PO TABS
5.0000 mg | ORAL_TABLET | Freq: Three times a day (TID) | ORAL | Status: DC | PRN
  Administered 2019-08-23: 10 mg via ORAL
  Filled 2019-08-21: qty 1

## 2019-08-21 MED ORDER — FENTANYL CITRATE (PF) 100 MCG/2ML IJ SOLN
INTRAMUSCULAR | Status: AC
  Administered 2019-08-21: 25 ug via INTRAVENOUS
  Filled 2019-08-21: qty 2

## 2019-08-21 MED ORDER — HYDROCODONE-ACETAMINOPHEN 7.5-325 MG PO TABS
ORAL_TABLET | ORAL | Status: AC
  Filled 2019-08-21: qty 1

## 2019-08-21 MED ORDER — MAGNESIUM CITRATE PO SOLN
1.0000 | Freq: Once | ORAL | Status: DC | PRN
  Filled 2019-08-21: qty 296

## 2019-08-21 MED ORDER — LEVOTHYROXINE SODIUM 50 MCG PO TABS
50.0000 ug | ORAL_TABLET | Freq: Every day | ORAL | Status: DC
  Administered 2019-08-22 – 2019-08-24 (×3): 50 ug via ORAL
  Filled 2019-08-21 (×3): qty 1

## 2019-08-21 MED ORDER — DOCUSATE SODIUM 100 MG PO CAPS
100.0000 mg | ORAL_CAPSULE | Freq: Two times a day (BID) | ORAL | Status: DC
  Administered 2019-08-21 – 2019-08-24 (×6): 100 mg via ORAL
  Filled 2019-08-21 (×6): qty 1

## 2019-08-21 MED ORDER — BISACODYL 10 MG RE SUPP
10.0000 mg | Freq: Every day | RECTAL | Status: DC | PRN
  Administered 2019-08-24: 10 mg via RECTAL
  Filled 2019-08-21: qty 1

## 2019-08-21 MED ORDER — HYDROCODONE-ACETAMINOPHEN 5-325 MG PO TABS
1.0000 | ORAL_TABLET | ORAL | Status: DC | PRN
  Administered 2019-08-21: 2 via ORAL
  Administered 2019-08-21: 1 via ORAL
  Administered 2019-08-22 – 2019-08-23 (×7): 2 via ORAL
  Administered 2019-08-24: 1 via ORAL
  Administered 2019-08-24: 2 via ORAL
  Filled 2019-08-21 (×2): qty 2
  Filled 2019-08-21 (×2): qty 1
  Filled 2019-08-21 (×7): qty 2

## 2019-08-21 MED ORDER — ALLOPURINOL 300 MG PO TABS
300.0000 mg | ORAL_TABLET | Freq: Every day | ORAL | Status: DC
  Administered 2019-08-22 – 2019-08-24 (×3): 300 mg via ORAL
  Filled 2019-08-21 (×3): qty 1

## 2019-08-21 MED ORDER — ROCURONIUM BROMIDE 100 MG/10ML IV SOLN
INTRAVENOUS | Status: DC | PRN
  Administered 2019-08-21: 50 mg via INTRAVENOUS

## 2019-08-21 MED ORDER — ONDANSETRON HCL 4 MG/2ML IJ SOLN
INTRAMUSCULAR | Status: DC | PRN
  Administered 2019-08-21: 4 mg via INTRAVENOUS

## 2019-08-21 MED ORDER — FENTANYL CITRATE (PF) 100 MCG/2ML IJ SOLN
INTRAMUSCULAR | Status: DC | PRN
  Administered 2019-08-21 (×2): 50 ug via INTRAVENOUS

## 2019-08-21 MED ORDER — ONDANSETRON HCL 4 MG/2ML IJ SOLN: 4.0000 mg | Freq: Once | INTRAMUSCULAR | Status: DC | PRN

## 2019-08-21 MED ORDER — SODIUM CHLORIDE 0.9 % IV SOLN
INTRAVENOUS | Status: DC | PRN
  Administered 2019-08-21: 60 mL

## 2019-08-21 MED ORDER — POTASSIUM CHLORIDE CRYS ER 10 MEQ PO TBCR
10.0000 meq | EXTENDED_RELEASE_TABLET | Freq: Every day | ORAL | Status: DC
  Administered 2019-08-21 – 2019-08-24 (×4): 10 meq via ORAL
  Filled 2019-08-21 (×4): qty 1

## 2019-08-21 MED ORDER — BUPIVACAINE LIPOSOME 1.3 % IJ SUSP
INTRAMUSCULAR | Status: AC
  Filled 2019-08-21: qty 20

## 2019-08-21 MED ORDER — LABETALOL HCL 5 MG/ML IV SOLN
INTRAVENOUS | Status: AC
  Administered 2019-08-21: 5 mg via INTRAVENOUS
  Filled 2019-08-21: qty 4

## 2019-08-21 MED ORDER — BUPIVACAINE-EPINEPHRINE 0.25% -1:200000 IJ SOLN
INTRAMUSCULAR | Status: DC | PRN
  Administered 2019-08-21: 30 mL

## 2019-08-21 MED ORDER — HYDROCODONE-ACETAMINOPHEN 7.5-325 MG PO TABS
1.0000 | ORAL_TABLET | ORAL | Status: DC | PRN
  Administered 2019-08-21: 1 via ORAL
  Administered 2019-08-22 – 2019-08-23 (×4): 2 via ORAL
  Filled 2019-08-21 (×4): qty 2

## 2019-08-21 MED ORDER — ALBUTEROL SULFATE (2.5 MG/3ML) 0.083% IN NEBU: 2.5000 mg | INHALATION_SOLUTION | Freq: Four times a day (QID) | RESPIRATORY_TRACT | Status: DC | PRN

## 2019-08-21 MED ORDER — CALCIUM CARBONATE ANTACID 500 MG PO CHEW
600.0000 mg | CHEWABLE_TABLET | Freq: Every day | ORAL | Status: DC
  Administered 2019-08-21 – 2019-08-23 (×3): 600 mg via ORAL
  Filled 2019-08-21 (×3): qty 3

## 2019-08-21 MED ORDER — SUCCINYLCHOLINE CHLORIDE 20 MG/ML IJ SOLN
INTRAMUSCULAR | Status: DC | PRN
  Administered 2019-08-21: 120 mg via INTRAVENOUS

## 2019-08-21 MED ORDER — CEFAZOLIN SODIUM-DEXTROSE 2-4 GM/100ML-% IV SOLN
INTRAVENOUS | Status: AC
  Filled 2019-08-21: qty 100

## 2019-08-21 MED ORDER — MAGNESIUM HYDROXIDE 400 MG/5ML PO SUSP
30.0000 mL | Freq: Every day | ORAL | Status: DC | PRN
  Administered 2019-08-23: 30 mL via ORAL
  Filled 2019-08-21: qty 30

## 2019-08-21 MED ORDER — PRAVASTATIN SODIUM 40 MG PO TABS
40.0000 mg | ORAL_TABLET | Freq: Every day | ORAL | Status: DC
  Administered 2019-08-21 – 2019-08-23 (×3): 40 mg via ORAL
  Filled 2019-08-21: qty 2
  Filled 2019-08-21 (×4): qty 1
  Filled 2019-08-21: qty 2

## 2019-08-21 MED ORDER — ONDANSETRON HCL 4 MG/2ML IJ SOLN
INTRAMUSCULAR | Status: AC
  Filled 2019-08-21: qty 2

## 2019-08-21 MED ORDER — ACETAMINOPHEN 325 MG PO TABS: 325.0000 mg | ORAL_TABLET | Freq: Four times a day (QID) | ORAL | Status: DC | PRN

## 2019-08-21 MED ORDER — PROPOFOL 10 MG/ML IV BOLUS
INTRAVENOUS | Status: DC | PRN
  Administered 2019-08-21: 130 mg via INTRAVENOUS

## 2019-08-21 MED ORDER — VITAMIN B-12 1000 MCG PO TABS
1000.0000 ug | ORAL_TABLET | Freq: Every day | ORAL | Status: DC
  Administered 2019-08-21 – 2019-08-24 (×4): 1000 ug via ORAL
  Filled 2019-08-21 (×4): qty 1

## 2019-08-21 MED ORDER — SODIUM CHLORIDE 0.9 % IV SOLN: INTRAVENOUS | Status: DC

## 2019-08-21 MED ORDER — SODIUM CHLORIDE FLUSH 0.9 % IV SOLN
INTRAVENOUS | Status: AC
  Filled 2019-08-21: qty 40

## 2019-08-21 MED ORDER — PHENOL 1.4 % MT LIQD
1.0000 | OROMUCOSAL | Status: DC | PRN
  Filled 2019-08-21: qty 177

## 2019-08-21 MED ORDER — DIPHENHYDRAMINE HCL 12.5 MG/5ML PO ELIX: 12.5000 mg | ORAL_SOLUTION | ORAL | Status: DC | PRN

## 2019-08-21 MED ORDER — LOSARTAN POTASSIUM 50 MG PO TABS
50.0000 mg | ORAL_TABLET | Freq: Every evening | ORAL | Status: DC
  Administered 2019-08-21 – 2019-08-23 (×3): 50 mg via ORAL
  Filled 2019-08-21 (×3): qty 1

## 2019-08-21 MED ORDER — METOCLOPRAMIDE HCL 5 MG/ML IJ SOLN: 5.0000 mg | Freq: Three times a day (TID) | INTRAMUSCULAR | Status: DC | PRN

## 2019-08-21 MED ORDER — LACTATED RINGERS IV SOLN
INTRAVENOUS | Status: DC | PRN
  Administered 2019-08-21: 1000 mL via INTRAVENOUS

## 2019-08-21 MED ORDER — NEOMYCIN-POLYMYXIN B GU 40-200000 IR SOLN
Status: DC | PRN
  Administered 2019-08-21: 4 mL

## 2019-08-21 MED ORDER — FUROSEMIDE 40 MG PO TABS
40.0000 mg | ORAL_TABLET | Freq: Every day | ORAL | Status: DC
  Administered 2019-08-22 – 2019-08-24 (×3): 40 mg via ORAL
  Filled 2019-08-21 (×4): qty 1

## 2019-08-21 MED ORDER — DEXAMETHASONE SODIUM PHOSPHATE 10 MG/ML IJ SOLN
INTRAMUSCULAR | Status: AC
  Filled 2019-08-21: qty 1

## 2019-08-21 MED ORDER — FENTANYL CITRATE (PF) 100 MCG/2ML IJ SOLN
25.0000 ug | INTRAMUSCULAR | Status: DC | PRN
  Administered 2019-08-21 (×4): 25 ug via INTRAVENOUS

## 2019-08-21 MED ORDER — CEFAZOLIN SODIUM-DEXTROSE 2-4 GM/100ML-% IV SOLN
2.0000 g | INTRAVENOUS | Status: AC
  Administered 2019-08-21: 2 g via INTRAVENOUS

## 2019-08-21 MED ORDER — METHOCARBAMOL 1000 MG/10ML IJ SOLN
500.0000 mg | Freq: Four times a day (QID) | INTRAVENOUS | Status: DC | PRN
  Administered 2019-08-21: 500 mg via INTRAVENOUS
  Filled 2019-08-21 (×3): qty 5

## 2019-08-21 MED ORDER — CLOPIDOGREL BISULFATE 75 MG PO TABS
75.0000 mg | ORAL_TABLET | Freq: Every day | ORAL | Status: DC
  Administered 2019-08-21 – 2019-08-24 (×4): 75 mg via ORAL
  Filled 2019-08-21 (×4): qty 1

## 2019-08-21 MED ORDER — AZELASTINE HCL 0.1 % NA SOLN
1.0000 | Freq: Two times a day (BID) | NASAL | Status: DC | PRN
  Filled 2019-08-21: qty 30

## 2019-08-21 MED ORDER — MENTHOL 3 MG MT LOZG
1.0000 | LOZENGE | OROMUCOSAL | Status: DC | PRN
  Filled 2019-08-21: qty 9

## 2019-08-21 MED ORDER — ONDANSETRON HCL 4 MG PO TABS: 4.0000 mg | ORAL_TABLET | Freq: Four times a day (QID) | ORAL | Status: DC | PRN

## 2019-08-21 MED ORDER — DEXAMETHASONE SODIUM PHOSPHATE 10 MG/ML IJ SOLN
INTRAMUSCULAR | Status: DC | PRN
  Administered 2019-08-21: 5 mg via INTRAVENOUS

## 2019-08-21 MED ORDER — BUPIVACAINE-EPINEPHRINE (PF) 0.25% -1:200000 IJ SOLN
INTRAMUSCULAR | Status: AC
  Filled 2019-08-21: qty 10

## 2019-08-21 MED ORDER — METHOCARBAMOL 500 MG PO TABS
500.0000 mg | ORAL_TABLET | Freq: Four times a day (QID) | ORAL | Status: DC | PRN
  Administered 2019-08-22 – 2019-08-24 (×8): 500 mg via ORAL
  Filled 2019-08-21 (×9): qty 1

## 2019-08-21 MED ORDER — MORPHINE SULFATE (PF) 4 MG/ML IV SOLN: 0.5000 mg | INTRAVENOUS | Status: DC | PRN

## 2019-08-21 MED ORDER — ZOLPIDEM TARTRATE 5 MG PO TABS: 5.0000 mg | ORAL_TABLET | Freq: Every evening | ORAL | Status: DC | PRN

## 2019-08-21 SURGICAL SUPPLY — 61 items
APL PRP STRL LF DISP 70% ISPRP (MISCELLANEOUS) ×1
BLADE SAGITTAL AGGR TOOTH XLG (BLADE) ×2 IMPLANT
BNDG COHESIVE 6X5 TAN STRL LF (GAUZE/BANDAGES/DRESSINGS) ×6 IMPLANT
CANISTER SUCT 1200ML W/VALVE (MISCELLANEOUS) ×2 IMPLANT
CANISTER WOUND CARE 500ML ATS (WOUND CARE) ×2 IMPLANT
CHLORAPREP W/TINT 26 (MISCELLANEOUS) ×2 IMPLANT
COVER BACK TABLE REUSABLE LG (DRAPES) ×2 IMPLANT
COVER WAND RF STERILE (DRAPES) ×2 IMPLANT
DRAPE 3/4 80X56 (DRAPES) ×6 IMPLANT
DRAPE C-ARM XRAY 36X54 (DRAPES) ×2 IMPLANT
DRAPE INCISE IOBAN 66X60 STRL (DRAPES) ×1 IMPLANT
DRAPE POUCH INSTRU U-SHP 10X18 (DRAPES) ×2 IMPLANT
DRESSING SURGICEL FIBRLLR 1X2 (HEMOSTASIS) ×2 IMPLANT
DRSG OPSITE POSTOP 4X8 (GAUZE/BANDAGES/DRESSINGS) ×4 IMPLANT
DRSG SURGICEL FIBRILLAR 1X2 (HEMOSTASIS) ×4
ELECT BLADE 6.5 EXT (BLADE) ×2 IMPLANT
ELECT REM PT RETURN 9FT ADLT (ELECTROSURGICAL) ×2
ELECTRODE REM PT RTRN 9FT ADLT (ELECTROSURGICAL) ×1 IMPLANT
GLOVE BIOGEL PI IND STRL 9 (GLOVE) ×1 IMPLANT
GLOVE BIOGEL PI INDICATOR 9 (GLOVE) ×1
GLOVE SURG SYN 9.0  PF PI (GLOVE) ×4
GLOVE SURG SYN 9.0 PF PI (GLOVE) ×2 IMPLANT
GOWN SRG 2XL LVL 4 RGLN SLV (GOWNS) ×1 IMPLANT
GOWN STRL NON-REIN 2XL LVL4 (GOWNS) ×2
GOWN STRL REUS W/ TWL LRG LVL3 (GOWN DISPOSABLE) ×1 IMPLANT
GOWN STRL REUS W/TWL LRG LVL3 (GOWN DISPOSABLE) ×2
HEMOVAC 400CC 10FR (MISCELLANEOUS) IMPLANT
HIP FEM HD M 28 (Head) ×1 IMPLANT
HOLDER FOLEY CATH W/STRAP (MISCELLANEOUS) ×2 IMPLANT
HOOD PEEL AWAY FLYTE STAYCOOL (MISCELLANEOUS) ×2 IMPLANT
KIT PREVENA INCISION MGT 13 (CANNISTER) ×2 IMPLANT
LINER DML 28MM HIGHCROSS (Liner) ×1 IMPLANT
MAT ABSORB  FLUID 56X50 GRAY (MISCELLANEOUS) ×2
MAT ABSORB FLUID 56X50 GRAY (MISCELLANEOUS) ×1 IMPLANT
NDL SAFETY ECLIPSE 18X1.5 (NEEDLE) ×1 IMPLANT
NDL SPNL 20GX3.5 QUINCKE YW (NEEDLE) ×2 IMPLANT
NEEDLE HYPO 18GX1.5 SHARP (NEEDLE) ×2
NEEDLE SPNL 20GX3.5 QUINCKE YW (NEEDLE) ×4 IMPLANT
NS IRRIG 1000ML POUR BTL (IV SOLUTION) ×2 IMPLANT
PACK HIP COMPR (MISCELLANEOUS) ×2 IMPLANT
SCALPEL PROTECTED #10 DISP (BLADE) ×4 IMPLANT
SHELL ACETABULAR DM  60MM (Shell) ×1 IMPLANT
SOL PREP PVP 2OZ (MISCELLANEOUS) ×2
SOLUTION PREP PVP 2OZ (MISCELLANEOUS) ×1 IMPLANT
SPONGE DRAIN TRACH 4X4 STRL 2S (GAUZE/BANDAGES/DRESSINGS) ×2 IMPLANT
STAPLER SKIN PROX 35W (STAPLE) ×2 IMPLANT
STEM FEM SZ6 STD COLLARED (Stem) ×1 IMPLANT
STRAP SAFETY 5IN WIDE (MISCELLANEOUS) ×2 IMPLANT
SUT DVC 2 QUILL PDO  T11 36X36 (SUTURE) ×2
SUT DVC 2 QUILL PDO T11 36X36 (SUTURE) ×1 IMPLANT
SUT SILK 0 (SUTURE) ×2
SUT SILK 0 30XBRD TIE 6 (SUTURE) ×1 IMPLANT
SUT V-LOC 90 ABS DVC 3-0 CL (SUTURE) ×2 IMPLANT
SUT VIC AB 1 CT1 36 (SUTURE) ×2 IMPLANT
SYR 20ML LL LF (SYRINGE) ×2 IMPLANT
SYR 30ML LL (SYRINGE) ×2 IMPLANT
SYR 50ML LL SCALE MARK (SYRINGE) ×4 IMPLANT
SYR BULB IRRIG 60ML STRL (SYRINGE) ×2 IMPLANT
TAPE MICROFOAM 4IN (TAPE) ×2 IMPLANT
TOWEL OR 17X26 4PK STRL BLUE (TOWEL DISPOSABLE) ×2 IMPLANT
TRAY FOLEY MTR SLVR 16FR STAT (SET/KITS/TRAYS/PACK) ×2 IMPLANT

## 2019-08-21 NOTE — Progress Notes (Signed)
Pt sitting up in chair and stated he doesn't wear Cpap when he sleeps in chair. Pt has his own Cpap unit at bedside and stated he would let RN know if he got in bed and decided to use it.

## 2019-08-21 NOTE — Anesthesia Preprocedure Evaluation (Addendum)
Anesthesia Evaluation  Patient identified by MRN, date of birth, ID band Patient awake  General Assessment Comment:Patient had bad experience with spinal anesthetic for his AAA surgery - it did not work and had to be converted to GETA, and then had severe back pain for 3 months afterward  Reviewed: Allergy & Precautions, NPO status , Patient's Chart, lab work & pertinent test results  History of Anesthesia Complications Negative for: history of anesthetic complications  Airway Mallampati: II  TM Distance: >3 FB Neck ROM: Full   Comment: Good neck extension.  Some fibrosis/stiffness of anterior neck Dental  (+) Partial Lower, Missing, Dental Advisory Given,    Pulmonary neg pulmonary ROS, shortness of breath and with exertion, sleep apnea and Continuous Positive Airway Pressure Ventilation , COPD, Patient abstained from smoking.Not current smoker, former smoker,  Hx laryngeal cancer s/p resection and XRT.   Pulmonary exam normal breath sounds clear to auscultation       Cardiovascular Exercise Tolerance: Good METShypertension, + CAD, + Past MI, + CABG and + Peripheral Vascular Disease  (-) dysrhythmias  Rhythm:Regular Rate:Normal - Systolic murmurs Nuclear stress test 2021:  Blood pressure demonstrated a normal response to exercise.  There was no ST segment deviation noted during stress.  Defect 1: There is a medium defect of moderate severity present in the basal anterior and apex location.  Findings consistent with prior myocardial infarction.  This is an intermediate risk study.  The left ventricular ejection fraction is moderately decreased (30-44%).  Nuclear stress EF: 36%.   Adequate chemical stress Moderately reduced left ventricular function globally ejection fraction between 35 to 40% No evidence of ischemia apical anterior scar Recommend conservative medical therapy   Neuro/Psych Anxiety TIAnegative neurological  ROS  negative psych ROS   GI/Hepatic GERD  Medicated and Controlled,(+)     (-) substance abuse  ,   Endo/Other  neg diabetesHypothyroidism   Renal/GU negative Renal ROS     Musculoskeletal   Abdominal   Peds  Hematology   Anesthesia Other Findings Past Medical History: No date: Anemia     Comment:  iron deficiency No date: Anxiety No date: Arthritis 2015: Cancer ([place])     Comment:  throat. had surgery and radiation No date: Complication of anesthesia     Comment:  woke up during AAA repair. Had an epidural and does NOT               want again. No date: Coronary artery disease No date: Dyspnea     Comment:  chronic cough since teenage years. No date: GERD (gastroesophageal reflux disease) No date: Gout No date: Hypercholesterolemia No date: Hypertension No date: Hypothyroidism 1993: Myocardial infarction ([place]) 08/11/2019: Obesity (BMI 30.0-34.9) No date: PONV (postoperative nausea and vomiting)     Comment:  Several times, vomitting for hours after surgery. No date: Sleep apnea No date: TIA (transient ischemic attack)  Reproductive/Obstetrics                            Anesthesia Physical Anesthesia Plan  ASA: III  Anesthesia Plan: General   Post-op Pain Management:    Induction: Intravenous  PONV Risk Score and Plan: 3 and Ondansetron and Dexamethasone  Airway Management Planned: Oral ETT  Additional Equipment: None  Intra-op Plan:   Post-operative Plan: Extubation in OR  Informed Consent: I have reviewed the patients History and Physical, chart, labs and discussed the procedure including the risks, benefits and alternatives  for the proposed anesthesia with the patient or authorized representative who has indicated his/her understanding and acceptance.     Dental advisory given  Plan Discussed with: CRNA and Surgeon  Anesthesia Plan Comments: (Patient stated he would like to avoid spinal anesthesia given his very  poor experience with his prior spinal. Discussed risks of GETA anesthesia with patient, including PONV, sore throat, lip/dental damage. Rare risks discussed as well, such as cardiorespiratory and neurological sequelae. Patient understands.)        Anesthesia Quick Evaluation

## 2019-08-21 NOTE — Op Note (Signed)
08/21/2019  11:52 AM  PATIENT:  Jesse Allison  79 y.o. male  PRE-OPERATIVE DIAGNOSIS:  Primary osteoarthritis of right hip Trochanteric bursitis of right hip  POST-OPERATIVE DIAGNOSIS:  Primary osteoarthritis of right hip Trochanteric bursitis of right hip  PROCEDURE:  Procedure(s): TOTAL HIP ARTHROPLASTY ANTERIOR APPROACH (Right)  SURGEON: Laurene Footman, MD  ASSISTANTS: None  ANESTHESIA:   general  EBL:  Total I/O In: 1100 [I.V.:1000; IV Piggyback:100] Out: -   BLOOD ADMINISTERED:none  DRAINS: Incisional wound VAC applied   LOCAL MEDICATIONS USED:  MARCAINE    and OTHER Exparel  SPECIMEN:  Source of Specimen:  Right femoral head  DISPOSITION OF SPECIMEN:  PATHOLOGY  COUNTS:  YES  TOURNIQUET:  * No tourniquets in log *  IMPLANTS: Medacta AMIS 6 standard stem with 60 mm Mpact TM cup and liner with metal M 28 mm head  DICTATION: .Dragon Dictation   The patient was brought to the operating room and after general anesthesia was obtained patient was placed on the operative table with the ipsilateral foot into the Medacta attachment, contralateral leg on a well-padded table. C-arm was brought in and preop template x-ray taken. After prepping and draping in usual sterile fashion appropriate patient identification and timeout procedures were completed. Anterior approach to the hip was obtained and centered over the greater trochanter and TFL muscle. The subcutaneous tissue was incised hemostasis being achieved by electrocautery. TFL fascia was incised and the muscle retracted laterally deep retractor placed. The lateral femoral circumflex vessels were identified and ligated. The anterior capsule was exposed and a capsulotomy performed. The neck was identified and a femoral neck cut carried out with a saw. The head was removed without difficulty and showed degenerated cartilage on the femoral head and synovitis and extensive wear of the cartilage in the acetabulum. Reaming was  carried out to 60 mm and a 60 mm cup trial gave appropriate tightness to the acetabular component a 60 mm DM cup was impacted into position. The leg was then externally rotated and ischiofemoral and pubofemoral releases carried out. The femur was sequentially broached to a size 6, size 6 standard with S head trials were placed and the final components chosen. The 6 standard stem was inserted along with a metal M 28 mm head and 60 mm liner. The hip was reduced and was stable the wound was thoroughly irrigated with fibrillar placed along the posterior capsule and medial neck. The deep fascia ws closed using a heavy Quill after infiltration of 30 cc of quarter percent Sensorcaine with epinephrine mixed with Exparel, .3-0 V-loc to close the skin with skin staples. Xeroform incisional wound VAC and patient was sent to recovery in stable condition.   PLAN OF CARE: Admit to inpatient

## 2019-08-21 NOTE — TOC Initial Note (Signed)
Transition of Care [facility]) - Initial/Assessment Note    Patient Details  Name: Jesse Allison MRN: [id number] Date of Birth: Mar 12, 1941  Transition of Care [facility]) CM/SW Contact:    Elease Hashimoto, LCSW Phone Number: 08/21/2019, 4:03 PM  Clinical Narrative:  Met with pt and wife who is at the bedside, who reports he was independent but limited due to hip pain. Both are independent and active, pt wants to get back to his exercise. He has a rw but may need a 3 in 1 before going home. He is aware [facility] will follow at home for home health. Pt hopes to do well when therapist comes in this afternoon. Wife is able to assist if needed. She will transport him to his appointments, until he can drive again. Will continue to follow to assist with discharge needs.                 Expected Discharge Plan: [place] Barriers to Discharge: Continued Medical Work up   Patient Goals and CMS Choice Patient states their goals for this hospitalization and ongoing recovery are:: I hope once healed I will be better than I was before surgery. It has been rough the past few months CMS Medicare.gov Compare Post Acute Care list provided to:: Patient Choice offered to / list presented to : Patient  Expected Discharge Plan and Services Expected Discharge Plan: [place] In-house Referral: Clinical Social Work   Post Acute Care Choice: Durable Medical Equipment, Home Health Living arrangements for the past 2 months: [place]: PT [place]: [facility] ([facility]) Date [place]: 08/21/19 Time [place]: 1602 Representative spoke with at [place]: [place] Arrangements/Services Living arrangements for the past 2 months: [place] with:: Spouse Patient language and need for interpreter reviewed:: No Do you feel safe going back to the place where you  live?: Yes      Need for Family Participation in Patient Care: Yes (Comment) Care giver support system in place?: Yes (comment) Current home services: DME(has a rw) Criminal Activity/Legal Involvement Pertinent to Current Situation/Hospitalization: No - Comment as needed  Activities of Daily Living      Permission Sought/Granted Permission sought to share information with : Family Supports, Chartered certified accountant granted to share information with : Yes, Verbal Permission Granted  Share Information with NAME: Katharine Look  Permission granted to share info w AGENCY: [facility]  Permission granted to share info w Relationship: Wife  Permission granted to share info w Contact Information: Helene Kelp  Emotional Assessment Appearance:: Appears stated age Attitude/Demeanor/Rapport: Gracious Affect (typically observed): Accepting, Adaptable Orientation: : Oriented to Self, Oriented to Place, Oriented to  Time, Oriented to Situation   Psych Involvement: No (comment)  Admission diagnosis:  Status post total hip replacement, right [E28.003] Patient Active Problem List   Diagnosis Date Noted  . Status post total hip replacement, right 08/21/2019  . Coagulation disorder ([place]) 11/14/2018  . Anxiety 08/15/2018  . Benign essential hypertension 08/15/2018  . CAD (coronary artery disease), autologous vein bypass graft 08/15/2018  . COPD (chronic obstructive pulmonary disease) ([place]) 08/15/2018  . Diverticulosis 08/15/2018  . Edema 08/15/2018  . GERD (gastroesophageal reflux disease) 08/15/2018  . Gout, joint 08/15/2018  . Mixed hyperlipidemia  08/15/2018  . PVD (peripheral vascular disease) ([place]) 08/15/2018  . Sleep apnea 08/15/2018  . Type 2 diabetes mellitus without complication ([place]) 01/74/9449  . Vitamin B12 deficiency 08/15/2018  . Onychomycosis of multiple toenails with type 2 diabetes mellitus ([place]) 09/28/2017  . Morbid obesity ([place]) 08/08/2016  . Senile purpura ([place])  02/08/2016  . Abdominal aortic aneurysm (AAA) without rupture ([place]) 02/07/2016  . Aortic aneurysm ([place]) 11/04/2015  . H/O laryngeal cancer 08/05/2015  . Anemia, iron deficiency 03/07/2015  . Lumbar radiculitis 08/25/2013  . Lumbar spondylosis 08/25/2013  . Lumbar stenosis with neurogenic claudication 08/25/2013   PCP:  Adin Hector, MD Pharmacy:   [place], [address] [place] [address] Phone: [phone] Fax: [phone]     Social Determinants of Health (SDOH) Interventions    Readmission Risk Interventions No flowsheet data found.

## 2019-08-21 NOTE — Transfer of Care (Signed)
Immediate Anesthesia Transfer of Care Note  Patient: Jesse Allison  Procedure(s) Performed: TOTAL HIP ARTHROPLASTY ANTERIOR APPROACH (Right Hip)  Patient Location: PACU  Anesthesia Type:General  Level of Consciousness: awake, alert  and oriented  Airway & Oxygen Therapy: Patient Spontanous Breathing and Patient connected to face mask oxygen  Post-op Assessment: Report given to RN and Post -op Vital signs reviewed and stable  Post vital signs: Reviewed  Last Vitals:  Vitals Value Taken Time  BP 166/99 08/21/19 1151  Temp    Pulse 77 08/21/19 1151  Resp 15 08/21/19 1151  SpO2 99 % 08/21/19 1151    Last Pain:  Vitals:   08/21/19 0844  TempSrc: Temporal  PainSc: 0-No pain         Complications: No apparent anesthesia complications

## 2019-08-21 NOTE — Anesthesia Postprocedure Evaluation (Signed)
Anesthesia Post Note  Patient: Jesse Allison  Procedure(s) Performed: TOTAL HIP ARTHROPLASTY ANTERIOR APPROACH (Right Hip)  Patient location during evaluation: PACU Anesthesia Type: General Level of consciousness: awake and alert Pain management: pain level controlled Vital Signs Assessment: post-procedure vital signs reviewed and stable Respiratory status: spontaneous breathing, nonlabored ventilation, respiratory function stable and patient connected to nasal cannula oxygen Cardiovascular status: blood pressure returned to baseline and stable Postop Assessment: no apparent nausea or vomiting Anesthetic complications: no     Last Vitals:  Vitals:   08/21/19 1412 08/21/19 1414  BP: (!) 180/78 (!) 182/82  Pulse:    Resp:    Temp:    SpO2:      Last Pain:  Vitals:   08/21/19 1410  TempSrc:   PainSc: 6                  Arita Miss

## 2019-08-21 NOTE — Evaluation (Signed)
Physical Therapy Evaluation Patient Details Name: Jesse Allison MRN: [id number] DOB: Jan 05, 1941 Today's Date: 08/21/2019   History of Present Illness  Pt admitted for R THR.  Clinical Impression  Pt is a pleasant 79 year old male who was admitted for R THR. Pt performs bed mobility/transfers with min assist and ambulation with cga and RW. Pt educated on [place] and appears motivated to participate. Pt demonstrates deficits with strength/mobility/pain. Would benefit from skilled PT to address above deficits and promote optimal return to PLOF. Recommend transition to [place] upon discharge from acute hospitalization.     Follow Up Recommendations Home health PT;Supervision - Intermittent    Equipment Recommendations  3in1 (PT)    Recommendations for Other Services       Precautions / Restrictions Precautions Precautions: Fall;Anterior Hip Precaution Booklet Issued: No Restrictions Weight Bearing Restrictions: Yes RLE Weight Bearing: Weight bearing as tolerated      Mobility  Bed Mobility Overal bed mobility: Needs Assistance Bed Mobility: Supine to Sit     Supine to sit: Min assist     General bed mobility comments: safe technique with cues for sequencing. Pt very anxious, however performs better than expected. Once seated, able to maintain seated balance safely  Transfers Overall transfer level: Needs assistance Equipment used: Rolling walker (2 wheeled) Transfers: Sit to/from Stand Sit to Stand: Min assist         General transfer comment: cues for sequencing. RW used. Upright posture. Has difficulty pushing with L hand due to gout flare  Ambulation/Gait Ambulation/Gait assistance: Min guard Gait Distance (Feet): 3 Feet Assistive device: Rolling walker (2 wheeled) Gait Pattern/deviations: Step-to pattern     General Gait Details: ambulated using step to gait pattern with very slow technique. No increase in pain, however pt very fearful of pain and tense  throughout. Heavy use of B UEs on RW.  Stairs            Wheelchair Mobility    Modified Rankin (Stroke Patients Only)       Balance Overall balance assessment: Needs assistance;History of Falls Sitting-balance support: Feet supported;Bilateral upper extremity supported Sitting balance-Leahy Scale: Good     Standing balance support: Bilateral upper extremity supported Standing balance-Leahy Scale: Good                               Pertinent Vitals/Pain Pain Assessment: 0-10 Pain Score: 5  Pain Location: R hip Pain Descriptors / Indicators: Operative site guarding Pain Intervention(s): Limited activity within patient's tolerance;Patient requesting pain meds-RN notified;Ice applied    Home Living Family/patient expects to be discharged to:: Private residence Living Arrangements: Spouse/significant other Available Help at Discharge: Family Type of Home: House Home Access: Stairs to enter Entrance Stairs-Rails: None Entrance Stairs-Number of Steps: 2 Home Layout: One level Home Equipment: Environmental consultant - 2 wheels;Cane - single point      Prior Function Level of Independence: Independent with assistive device(s)         Comments: ambulated household distances with RW at baseline. Pain limited.     Hand Dominance        Extremity/Trunk Assessment   Upper Extremity Assessment Upper Extremity Assessment: Overall WFL for tasks assessed    Lower Extremity Assessment Lower Extremity Assessment: Generalized weakness(R LE grossly 3/5; L LE grossly 4/5)       Communication   Communication: No difficulties  Cognition Arousal/Alertness: Awake/alert Behavior During Therapy: WFL for tasks assessed/performed Overall Cognitive  Status: Within Functional Limits for tasks assessed                                        General Comments      Exercises Other Exercises Other Exercises: supine ther-ex performed on B LE including AP, glut  sets, quad sets, and hip abd/add. All ther-ex performed x 10 reps with min assist.   Assessment/Plan    PT Assessment Patient needs continued PT services  PT Problem List Decreased strength;Decreased balance;Decreased mobility;Pain       PT Treatment Interventions DME instruction;Gait training;Stair training;Therapeutic exercise;Balance training    PT Goals (Current goals can be found in the Care Plan section)  Acute Rehab PT Goals Patient Stated Goal: to go home PT Goal Formulation: With patient Time For Goal Achievement: 09/04/19 Potential to Achieve Goals: Good    Frequency BID   Barriers to discharge        Co-evaluation               AM-PAC PT "6 Clicks" Mobility  Outcome Measure Help needed turning from your back to your side while in a flat bed without using bedrails?: A Little Help needed moving from lying on your back to sitting on the side of a flat bed without using bedrails?: A Little Help needed moving to and from a bed to a chair (including a wheelchair)?: A Little Help needed standing up from a chair using your arms (e.g., wheelchair or bedside chair)?: A Little Help needed to walk in hospital room?: A Lot Help needed climbing 3-5 steps with a railing? : A Lot 6 Click Score: 16    End of Session Equipment Utilized During Treatment: Gait belt Activity Tolerance: Patient tolerated treatment well Patient left: in chair;with chair alarm set;with SCD's reapplied Nurse Communication: Mobility status PT Visit Diagnosis: Muscle weakness (generalized) (M62.81);Difficulty in walking, not elsewhere classified (R26.2);Pain Pain - Right/Left: Right Pain - part of body: Hip    Time: [id number] PT Time Calculation (min) (ACUTE ONLY): 36 min   Charges:   PT Evaluation $PT Eval Moderate Complexity: 1 Mod PT Treatments $Therapeutic Exercise: 23-37 mins        Jesse Allison, PT, DPT [phone]   Jesse Allison 08/21/2019, 5:16 PM

## 2019-08-21 NOTE — Anesthesia Procedure Notes (Signed)
Procedure Name: Intubation Performed by: Lesle Reek, CRNA Pre-anesthesia Checklist: Timeout performed, Patient being monitored, Suction available, Emergency Drugs available and Patient identified Patient Re-evaluated:Patient Re-evaluated prior to induction Oxygen Delivery Method: Circle system utilized Preoxygenation: Pre-oxygenation with 100% oxygen Induction Type: IV induction Laryngoscope Size: Mac and 3 Grade View: Grade I Tube size: 7.5 mm Number of attempts: 1 Airway Equipment and Method: Stylet Placement Confirmation: ETT inserted through vocal cords under direct vision,  positive ETCO2,  CO2 detector and breath sounds checked- equal and bilateral Secured at: 23 cm Tube secured with: Tape

## 2019-08-22 LAB — CBC
HCT: 37 % — ABNORMAL LOW (ref 39.0–52.0)
Hemoglobin: 11.9 g/dL — ABNORMAL LOW (ref 13.0–17.0)
MCH: 30.1 pg (ref 26.0–34.0)
MCHC: 32.2 g/dL (ref 30.0–36.0)
MCV: 93.7 fL (ref 80.0–100.0)
Platelets: 253 10*3/uL (ref 150–400)
RBC: 3.95 MIL/uL — ABNORMAL LOW (ref 4.22–5.81)
RDW: 12.7 % (ref 11.5–15.5)
WBC: 10.7 10*3/uL — ABNORMAL HIGH (ref 4.0–10.5)
nRBC: 0 % (ref 0.0–0.2)

## 2019-08-22 MED ORDER — HYDROCODONE-ACETAMINOPHEN 7.5-325 MG PO TABS: 1.0000 | ORAL_TABLET | ORAL | 0 refills | Status: DC | PRN

## 2019-08-22 MED ORDER — POLYETHYLENE GLYCOL 3350 17 G PO PACK
17.0000 g | PACK | Freq: Every day | ORAL | Status: DC | PRN
  Administered 2019-08-23: 17 g via ORAL
  Filled 2019-08-22 (×2): qty 1

## 2019-08-22 MED ORDER — METHOCARBAMOL 500 MG PO TABS: 500.0000 mg | ORAL_TABLET | Freq: Four times a day (QID) | ORAL | 0 refills | Status: DC | PRN

## 2019-08-22 MED ORDER — DEXAMETHASONE SODIUM PHOSPHATE 10 MG/ML IJ SOLN
10.0000 mg | Freq: Once | INTRAMUSCULAR | Status: AC
  Administered 2019-08-22: 10 mg via INTRAVENOUS
  Filled 2019-08-22: qty 1

## 2019-08-22 MED ORDER — TAMSULOSIN HCL 0.4 MG PO CAPS
0.4000 mg | ORAL_CAPSULE | Freq: Every day | ORAL | Status: DC
  Administered 2019-08-22 – 2019-08-23 (×2): 0.4 mg via ORAL
  Filled 2019-08-22 (×2): qty 1

## 2019-08-22 MED ORDER — ASPIRIN EC 81 MG PO TBEC: 81.0000 mg | DELAYED_RELEASE_TABLET | Freq: Two times a day (BID) | ORAL | 0 refills | Status: DC

## 2019-08-22 MED ORDER — MAGNESIUM HYDROXIDE 400 MG/5ML PO SUSP
30.0000 mL | Freq: Once | ORAL | Status: AC
  Administered 2019-08-22: 30 mL via ORAL
  Filled 2019-08-22: qty 30

## 2019-08-22 NOTE — TOC Progression Note (Signed)
Transition of Care [facility]) - Progression Note    Patient Details  Name: Jesse Allison MRN: [id number] Date of Birth: [birth year]  Transition of Care [facility]) CM/SW Contact  Raynard Mapps, Gardiner Rhyme, LCSW Phone Number: 08/22/2019, 8:48 AM  Clinical Narrative:  Met with pt and wife who is at the bedside regarding needs. Wife feels they need a bed rail and informed how to obtain this, she will get on her own and a 3 in 1. Can order via [employer] and have delivered to room prior to discharge. [facility] already set up for follow up and they are aware. Both report he will be going home tomorrow and hope therapy will start by Monday. Will order 3 in 1 and work on discharge needs.     Expected Discharge Plan: [place] Barriers to Discharge: Continued Medical Work up  Expected Discharge Plan and Services Expected Discharge Plan: [place] In-house Referral: Clinical Social Work   Post Acute Care Choice: Museum/gallery conservator, Home Health Living arrangements for the past 2 months: [place]: PT [place]: [facility] (now [facility]) Date [place]: 08/21/19 Time [place]: 1602 Representative spoke with at [place]: [place] ([place]) Interventions    Readmission Risk Interventions No flowsheet data found.

## 2019-08-22 NOTE — Discharge Instructions (Signed)

## 2019-08-22 NOTE — Progress Notes (Signed)
   Subjective: 1 Day Post-Op Procedure(s) (LRB): TOTAL HIP ARTHROPLASTY ANTERIOR APPROACH (Right) Patient reports pain as mild.   Patient is well, but has had some minor complaints of Swelling in the left hand, states was diagnosed as gout.  Has had negative ultrasound and x-rays.  Requesting something to help with swelling. Denies any CP, SOB, ABD pain. We will continue therapy today.  Plan is to go Home after hospital stay.  Objective: Vital signs in last 24 hours: Temp:  [96.9 F (36.1 C)-98.5 F (36.9 C)] 97.5 F (36.4 C) (05/14 0738) Pulse Rate:  [62-87] 62 (05/14 0738) Resp:  [15-20] 16 (05/14 0738) BP: (154-200)/(75-99) 193/90 (05/14 0738) SpO2:  [92 %-100 %] 96 % (05/14 0738) Weight:  [id number] kg] 117 kg (05/13 0844)  Intake/Output from previous day: 05/13 0701 - 05/14 0700 In: 3648.6 [P.O.:120; I.V.:3428.6; IV Piggyback:100] Out: 2050 [Urine:1900; Blood:150] Intake/Output this shift: No intake/output data recorded.  Recent Labs    08/22/19 0434  HGB 11.9*   Recent Labs    08/22/19 0434  WBC 10.7*  RBC 3.95*  HCT 37.0*  PLT 253   Recent Labs    08/21/19 1533  NA 134*  K 4.7  CL 101  CO2 26  BUN 13  CREATININE 0.82  GLUCOSE 179*  CALCIUM 8.6*   No results for input(s): LABPT, INR in the last 72 hours.  EXAM General - Patient is Alert, Appropriate and Oriented Extremity - Sensation intact distally Intact pulses distally Dorsiflexion/Plantar flexion intact No cellulitis present Compartment soft Dressing - dressing C/D/I and no drainage, Praveena intact without drainage Motor Function - intact, moving foot and toes well on exam.   Past Medical History:  Diagnosis Date  . Anemia    iron deficiency  . Anxiety   . Arthritis   . Cancer ([place]) 2015   throat. had surgery and radiation  . Complication of anesthesia    woke up during AAA repair. Had an epidural and does NOT want again.  . Coronary artery disease   . Dyspnea    chronic cough since  teenage years.  [place] Kitchen GERD (gastroesophageal reflux disease)   . Gout   . Hypercholesterolemia   . Hypertension   . Hypothyroidism   . Myocardial infarction ([place]) 1993  . Obesity (BMI 30.0-34.9) 08/11/2019  . PONV (postoperative nausea and vomiting)    Several times, vomitting for hours after surgery.  . Sleep apnea   . TIA (transient ischemic attack)     Assessment/Plan:   1 Day Post-Op Procedure(s) (LRB): TOTAL HIP ARTHROPLASTY ANTERIOR APPROACH (Right) Active Problems:   Status post total hip replacement, right  Estimated body mass index is 34.99 kg/m as calculated from the following:   Height as of this encounter: 6' (1.829 m).   Weight as of this encounter: 117 kg. Advance diet Up with therapy  Needs bowel movement Labs stable Recheck labs in the morning One-time dose of dexamethasone IV to help with left hand swelling. Care manager to assist with discharge  DVT Prophylaxis - Aspirin, TED hose and SCDs Weight-Bearing as tolerated to right leg   T. Rachelle Hora, PA-C [place] 08/22/2019, 8:00 AM

## 2019-08-22 NOTE — TOC Progression Note (Signed)
Transition of Care [facility]) - Progression Note    Patient Details  Name: Jesse Allison MRN: [id number] Date of Birth: 07-17-1940  Transition of Care [facility]) CM/SW Contact  Haile Bosler, Gardiner Rhyme, LCSW Phone Number: 08/22/2019, 2:43 PM  Clinical Narrative:   Have ordered a RW due to his is not in good shape. Ordered from [employer] and will deliver to room prior to DC home.    Expected Discharge Plan: [place] Barriers to Discharge: Continued Medical Work up  Expected Discharge Plan and Services Expected Discharge Plan: [place] In-house Referral: Clinical Social Work   Post Acute Care Choice: Museum/gallery conservator, Home Health Living arrangements for the past 2 months: [place]: PT [place]: [facility] Date [place]: 08/21/19 Time [place]: 1602 Representative spoke with at [place]: [place] ([place]) Interventions    Readmission Risk Interventions No flowsheet data found.

## 2019-08-22 NOTE — Progress Notes (Signed)
Physical Therapy Treatment Patient Details Name: Jesse Allison MRN: [id number] DOB: Mar 25, 1941 Today's Date: 08/22/2019    History of Present Illness Pt admitted for R THR; presents with swelling and baseline pain of L hand/wrist    PT Comments    Pt is making gradual progress towards goals with improved pain tolerance this session, however unable to progress gait distance. Quality of movement improved with inconsistent reciprocal gait. Good endurance with there-ex. Continues to be motivated to participate and frustrated with pain control. Encouraged to continue mobility efforts with RW. Will continue to progress. Will need to perform stair training prior to discharge.   Follow Up Recommendations  Home health PT;Supervision - Intermittent     Equipment Recommendations  3in1 (PT);Rolling walker with 5" wheels    Recommendations for Other Services       Precautions / Restrictions Precautions Precautions: Fall;Anterior Hip Precaution Booklet Issued: Yes (comment) Restrictions Weight Bearing Restrictions: Yes RLE Weight Bearing: Weight bearing as tolerated    Mobility  Bed Mobility               General bed mobility comments: not performed as pt in recliner at start/end of session  Transfers Overall transfer level: Needs assistance Equipment used: Rolling walker (2 wheeled) Transfers: Sit to/from Stand Sit to Stand: Min assist         General transfer comment: improved transfer this date with no cues for hand placement. Once standing, upright posture noted. Improved pain tolerance  Ambulation/Gait Ambulation/Gait assistance: Min guard Gait Distance (Feet): 45 Feet Assistive device: Rolling walker (2 wheeled) Gait Pattern/deviations: Step-to pattern     General Gait Details: slightly improved gait speed with inconsistent reciprocal gait pattern. Forward flexed posture. Appears more relaxed this session. 1 episode of severe pain during ambulation.   Stairs              Wheelchair Mobility    Modified Rankin (Stroke Patients Only)       Balance Overall balance assessment: Needs assistance Sitting-balance support: Feet supported Sitting balance-Leahy Scale: Good Sitting balance - Comments: Pt maintained steady seated balance while reaching outside of his BOS for urinal and during pericare.   Standing balance support: Bilateral upper extremity supported Standing balance-Leahy Scale: Fair Standing balance comment: Pt unable to maintain standing balance without RW this session 2/2 pain.                            Cognition Arousal/Alertness: Awake/alert Behavior During Therapy: WFL for tasks assessed/performed Overall Cognitive Status: Within Functional Limits for tasks assessed                                 General Comments: Pt and caregiver both great problem solvers and thinking ahead about discharge care mgt      Exercises Other Exercises Other Exercises: Pt/caregiver educated on and demonstrated comprssion stocking mgt, using AE for LBD, showering and DME/AE information and training. Pt and caregiver both were receptive, verbalized information and demonstrated the strategies educated on. Handout provided for reinforcement and carryover. Other Exercises: Seated R LE ther-ex including AP, glut sets, quad sets, SLR, hip abd/add, and LAQ. All ther-ex performed x 12 reps with cues for technique    General Comments General comments (skin integrity, edema, etc.): L hand was swollen (per pt report, this is prior to hospitalization)      Pertinent Vitals/Pain Pain  Assessment: 0-10 Pain Score: 5  Pain Location: R hip Pain Descriptors / Indicators: Burning;Jabbing;Operative site guarding;Sharp;Shooting;Stabbing Pain Intervention(s): Limited activity within patient's tolerance;Repositioned    Home Living Family/patient expects to be discharged to:: Private residence Living Arrangements:  Spouse/significant other Available Help at Discharge: Family Type of Home: House Home Access: Stairs to enter Entrance Stairs-Rails: None Home Layout: One level Home Equipment: Cane - single point;Walker - 4 wheels Additional Comments: Pt and caregiver interested in bed rail for home    Prior Function Level of Independence: Independent with assistive device(s)      Comments: Ambulated household distances with RW due to pain (starting in Nov 2020); previously used a single point cane.   PT Goals (current goals can now be found in the care plan section) Acute Rehab PT Goals Patient Stated Goal: To have reduced pain so that he can get around without a walker PT Goal Formulation: With patient Time For Goal Achievement: 09/04/19 Potential to Achieve Goals: Good Progress towards PT goals: Progressing toward goals    Frequency    BID      PT Plan Current plan remains appropriate    Co-evaluation              AM-PAC PT "6 Clicks" Mobility   Outcome Measure  Help needed turning from your back to your side while in a flat bed without using bedrails?: A Little Help needed moving from lying on your back to sitting on the side of a flat bed without using bedrails?: A Little Help needed moving to and from a bed to a chair (including a wheelchair)?: A Little Help needed standing up from a chair using your arms (e.g., wheelchair or bedside chair)?: A Little Help needed to walk in hospital room?: A Lot Help needed climbing 3-5 steps with a railing? : A Lot 6 Click Score: 16    End of Session Equipment Utilized During Treatment: Gait belt Activity Tolerance: Patient limited by pain Patient left: in chair;with chair alarm set;with SCD's reapplied Nurse Communication: Mobility status PT Visit Diagnosis: Muscle weakness (generalized) (M62.81);Difficulty in walking, not elsewhere classified (R26.2);Pain Pain - Right/Left: Right Pain - part of body: Hip     Time: [id number] PT  Time Calculation (min) (ACUTE ONLY): 29 min  Charges:  $Gait Training: 8-22 mins $Therapeutic Exercise: 8-22 mins                     [name], PT, DPT [phone]    Trivia Heffelfinger 08/22/2019, 4:19 PM

## 2019-08-22 NOTE — Discharge Summary (Signed)
Physician Discharge Summary  Patient ID: Jesse Allison MRN: [id number] DOB/AGE: 1940/09/02 79 y.o.  Admit date: 08/21/2019 Discharge date: 08/24/19 Admission Diagnoses:  Status post total hip replacement, right [Z96.641]   Discharge Diagnoses: Patient Active Problem List   Diagnosis Date Noted  . Status post total hip replacement, right 08/21/2019  . Coagulation disorder ([place]) 11/14/2018  . Anxiety 08/15/2018  . Benign essential hypertension 08/15/2018  . CAD (coronary artery disease), autologous vein bypass graft 08/15/2018  . COPD (chronic obstructive pulmonary disease) ([place]) 08/15/2018  . Diverticulosis 08/15/2018  . Edema 08/15/2018  . GERD (gastroesophageal reflux disease) 08/15/2018  . Gout, joint 08/15/2018  . Mixed hyperlipidemia 08/15/2018  . PVD (peripheral vascular disease) ([place]) 08/15/2018  . Sleep apnea 08/15/2018  . Type 2 diabetes mellitus without complication ([place]) [id number]  . Vitamin B12 deficiency 08/15/2018  . Onychomycosis of multiple toenails with type 2 diabetes mellitus ([place]) 09/28/2017  . Morbid obesity ([place]) 08/08/2016  . Senile purpura ([place]) 02/08/2016  . Abdominal aortic aneurysm (AAA) without rupture ([place]) 02/07/2016  . Aortic aneurysm ([place]) 11/04/2015  . H/O laryngeal cancer 08/05/2015  . Anemia, iron deficiency 03/07/2015  . Lumbar radiculitis 08/25/2013  . Lumbar spondylosis 08/25/2013  . Lumbar stenosis with neurogenic claudication 08/25/2013    Past Medical History:  Diagnosis Date  . Anemia    iron deficiency  . Anxiety   . Arthritis   . Cancer ([place]) 2015   throat. had surgery and radiation  . Complication of anesthesia    woke up during AAA repair. Had an epidural and does NOT want again.  . Coronary artery disease   . Dyspnea    chronic cough since teenage years.  Marland Kitchen GERD (gastroesophageal reflux disease)   . Gout   . Hypercholesterolemia   . Hypertension   . Hypothyroidism   . Myocardial infarction ([place]) 1993  . Obesity  (BMI 30.0-34.9) 08/11/2019  . PONV (postoperative nausea and vomiting)    Several times, vomitting for hours after surgery.  . Sleep apnea   . TIA (transient ischemic attack)      Transfusion: none   Consultants (if any):   Discharged Condition: Improved  Hospital Course: SAVEER BIVENS is an 79 y.o. male who was admitted 08/21/2019 with a diagnosis of right hip osteoarthritis and went to the operating room on 08/21/2019 and underwent the above named procedures.    Surgeries: Procedure(s): TOTAL HIP ARTHROPLASTY ANTERIOR APPROACH on 08/21/2019 Patient tolerated the surgery well. Taken to PACU where she was stabilized and then transferred to the orthopedic floor.  Started on aspirin plavix. Foot pumps applied bilaterally at 80 mm. Heels elevated on bed with rolled towels. No evidence of DVT. Negative [place]. Physical therapy started on day #1 for gait training and transfer. OT started day #1 for ADL and assisted devices.  Patient's foley was d/c on day #1.  On postop day 1 patient having some issues with urination, started on Flomax.  Able to make urine.  No burning or signs of infection.  He also noted some swelling in his left hand, concern for possible gout, IV dexamethasone given, the significantly improved left hand pain.  Patient made good progress of physical therapy on postop day 1.    Implants: Medacta AMIS 6 standard stem with 60 mm Mpact TM cup and liner with metal M 28 mm head  He was given perioperative antibiotics:  Anti-infectives (From admission, onward)   Start     Dose/Rate Route Frequency Ordered Stop  08/21/19 0840  ceFAZolin (ANCEF) 2-4 GM/100ML-% IVPB    Note to Pharmacy: [place], [place]   : cabinet override      08/21/19 0840 08/21/19 1038   08/21/19 0815  ceFAZolin (ANCEF) IVPB 2g/100 mL premix     2 g 200 mL/hr over 30 Minutes Intravenous On call to O.R. 08/21/19 [id number] 08/21/19 1025    .  He was given sequential compression devices, early ambulation, and  aspirin and Plavix, teds for DVT prophylaxis.  He benefited maximally from the hospital stay and there were no complications.    Recent vital signs:  Vitals:   08/24/19 0847 08/24/19 0941  BP: (!) 174/82 (!) 150/82  Pulse: 85   Resp:    Temp:    SpO2:      Recent laboratory studies:  Lab Results  Component Value Date   HGB 11.3 (L) 08/23/2019   HGB 11.9 (L) 08/22/2019   HGB 12.6 (L) 08/13/2019   Lab Results  Component Value Date   WBC 12.6 (H) 08/23/2019   PLT 258 08/23/2019   No results found for: INR Lab Results  Component Value Date   NA 136 08/23/2019   K 4.2 08/23/2019   CL 99 08/23/2019   CO2 30 08/23/2019   BUN 15 08/23/2019   CREATININE 0.71 08/23/2019   GLUCOSE 158 (H) 08/23/2019    Discharge Medications:   Allergies as of 08/24/2019      Reactions   Niacin Other (See Comments)   Leg cramps   Oxycontin [oxycodone]    Makes him loopy and out of control.  Does not like the sensation.   Prednisone Other (See Comments)   Severe pain D/T stopping prednisone immediately rather than weaning it.      Medication List    TAKE these medications   albuterol 108 (90 Base) MCG/ACT inhaler Commonly known as: VENTOLIN HFA Inhale 1-2 puffs into the lungs every 6 (six) hours as needed for wheezing or shortness of breath.   allopurinol 300 MG tablet Commonly known as: ZYLOPRIM Take 300 mg by mouth daily with lunch.   aspirin EC 81 MG tablet Take 1 tablet (81 mg total) by mouth in the morning and at bedtime. What changed: when to take this   azelastine 0.1 % nasal spray Commonly known as: ASTELIN Place 1-2 sprays into both nostrils 2 (two) times daily as needed for allergies.   Calcium 600 1500 (600 Ca) MG Tabs tablet Generic drug: calcium carbonate Take 600 mg of elemental calcium by mouth at bedtime.   carvedilol 25 MG tablet Commonly known as: COREG Take 12.5 mg by mouth 2 (two) times daily with a meal.   clopidogrel 75 MG tablet Commonly known as:  PLAVIX Take 75 mg by mouth daily with lunch.   cyanocobalamin 1000 MCG tablet Take 1,000 mcg by mouth daily after lunch.   furosemide 40 MG tablet Commonly known as: LASIX Take 40 mg by mouth daily.   HYDROcodone-acetaminophen 7.5-325 MG tablet Commonly known as: NORCO Take 1-2 tablets by mouth every 4 (four) hours as needed for severe pain (pain score 7-10).   Iron (Ferrous Sulfate) 142 (45 Fe) MG Tbcr Take 45 mg by mouth daily with lunch.   levothyroxine 50 MCG tablet Commonly known as: SYNTHROID Take 50 mcg by mouth daily before breakfast.   losartan 50 MG tablet Commonly known as: COZAAR Take 50 mg by mouth every evening.   lovastatin 20 MG tablet Commonly known as: MEVACOR Take 20 mg by mouth  daily at 6 PM.   methocarbamol 500 MG tablet Commonly known as: ROBAXIN Take 1 tablet (500 mg total) by mouth every 6 (six) hours as needed for muscle spasms.   pantoprazole 40 MG tablet Commonly known as: PROTONIX Take 40 mg by mouth daily with supper.   potassium chloride 10 MEQ tablet Commonly known as: KLOR-CON Take 10 mEq by mouth daily.            Durable Medical Equipment  (From admission, onward)         Start     Ordered   08/21/19 1156  DME Walker rolling  Once    Question Answer Comment  Walker: With 5 Inch Wheels   Patient needs a walker to treat with the following condition Status post total hip replacement, right      08/21/19 1155   08/21/19 1156  DME 3 n 1  Once     08/21/19 1155   08/21/19 1156  DME Bedside commode  Once    Question:  Patient needs a bedside commode to treat with the following condition  Answer:  Status post total hip replacement, right   08/21/19 1155          Diagnostic Studies: NM Myocar Multi W/Spect W/Wall Motion / EF  Result Date: 07/28/2019  Blood pressure demonstrated a normal response to exercise.  There was no ST segment deviation noted during stress.  Defect 1: There is a medium defect of moderate severity  present in the basal anterior and apex location.  Findings consistent with prior myocardial infarction.  This is an intermediate risk study.  The left ventricular ejection fraction is moderately decreased (30-44%).  Nuclear stress EF: 36%.  Adequate chemical stress Moderately reduced left ventricular function globally ejection fraction between 35 to 40% No evidence of ischemia apical anterior scar Recommend conservative medical therapy   US Venous Img Upper Uni Left (DVT)  Result Date: 08/12/2019 CLINICAL DATA:  Upper extremity swelling. EXAM: LEFT UPPER EXTREMITY VENOUS DOPPLER ULTRASOUND TECHNIQUE: Gray-scale sonography with graded compression, as well as color Doppler and duplex ultrasound were performed to evaluate the upper extremity deep venous system from the level of the subclavian vein and including the jugular, axillary, basilic, radial, ulnar and upper cephalic vein. Spectral Doppler was utilized to evaluate flow at rest and with distal augmentation maneuvers. COMPARISON:  PET-CT 11/09/2010.  CT neck 10/28/2010. FINDINGS: Contralateral Subclavian Vein: Respiratory phasicity is normal and symmetric with the symptomatic side. No evidence of thrombus. Normal compressibility. Internal Jugular Vein: No evidence of thrombus. Normal compressibility, respiratory phasicity and response to augmentation. Subclavian Vein: No evidence of thrombus. Normal compressibility, respiratory phasicity and response to augmentation. Axillary Vein: No evidence of thrombus. Normal compressibility, respiratory phasicity and response to augmentation. Cephalic Vein: No evidence of thrombus. Normal compressibility, respiratory phasicity and response to augmentation. Basilic Vein: No evidence of thrombus. Normal compressibility, respiratory phasicity and response to augmentation. Brachial Veins: No evidence of thrombus. Normal compressibility, respiratory phasicity and response to augmentation. Radial Veins: No evidence of  thrombus. Normal compressibility, respiratory phasicity and response to augmentation. Ulnar Veins: No evidence of thrombus. Normal compressibility, respiratory phasicity and response to augmentation. Other Findings:  None visualized. IMPRESSION: No evidence of DVT within the left upper extremity. Electronically Signed   By: Marcello Moores  Register   On: 08/12/2019 14:36   ECHOCARDIOGRAM COMPLETE  Result Date: 07/29/2019    ECHOCARDIOGRAM REPORT   Patient Name:   HESHY AFTAB Date of Exam: 07/28/2019 Medical Rec #:  [id number]        Height:       72.0 in Accession #:    [id number]       Weight:       255.0 lb Date of Birth:  06-23-1940         BSA:          85.362 m Patient Age:    60 years         BP:           Not listed in chart/Not listed in                                                chart mmHg Patient Gender: M                HR:           175 bpm. Exam Location:  [facility] Procedure: 2D Echo, Cardiac Doppler and Color Doppler Indications:     Shortness of breath  History:         Patient has no prior history of Echocardiogram examinations.                  CAD, Stroke and TIA; Risk Factors:Sleep Apnea. MI.  Sonographer:     Sherrie Sport RDCS (AE) Referring Phys:  [place] Diagnosing Phys: Serafina Royals MD IMPRESSIONS  1. Left ventricular ejection fraction, by estimation, is 35 to 40%. The left ventricle has moderately decreased function. The left ventricle demonstrates regional wall motion abnormalities (see scoring diagram/findings for description). The left ventricular internal cavity size was mildly dilated. Left ventricular diastolic parameters were normal.  2. Right ventricular systolic function is normal. The right ventricular size is normal. There is normal pulmonary artery systolic pressure.  3. Left atrial size was mildly dilated.  4. Right atrial size was mildly dilated.  5. The mitral valve is normal in structure. Mild to moderate mitral valve regurgitation.  6. The aortic valve is normal  in structure. Aortic valve regurgitation is not visualized. FINDINGS  Left Ventricle: Left ventricular ejection fraction, by estimation, is 35 to 40%. The left ventricle has moderately decreased function. The left ventricle demonstrates regional wall motion abnormalities. Severe akinesis of the left ventricular, mid-apical anteroseptal wall and anterior wall. The left ventricular internal cavity size was mildly dilated. There is no left ventricular hypertrophy. Left ventricular diastolic parameters were normal. Right Ventricle: The right ventricular size is normal. No increase in right ventricular wall thickness. Right ventricular systolic function is normal. There is normal pulmonary artery systolic pressure. The tricuspid regurgitant velocity is 1.97 m/s, and  with an assumed right atrial pressure of 10 mmHg, the estimated right ventricular systolic pressure is [id number]. Left Atrium: Left atrial size was mildly dilated. Right Atrium: Right atrial size was mildly dilated. Pericardium: There is no evidence of pericardial effusion. Mitral Valve: The mitral valve is normal in structure. Mild to moderate mitral valve regurgitation. Tricuspid Valve: The tricuspid valve is normal in structure. Tricuspid valve regurgitation is mild. Aortic Valve: The aortic valve is normal in structure. Aortic valve regurgitation is not visualized. Aortic valve mean gradient measures 2.5 mmHg. Aortic valve peak gradient measures 4.7 mmHg. Aortic valve area, by VTI measures 2.27 cm. Pulmonic Valve: The pulmonic valve was normal in structure. Pulmonic valve regurgitation is not visualized. Aorta: The  aortic root and ascending aorta are structurally normal, with no evidence of dilitation. IAS/Shunts: No atrial level shunt detected by color flow Doppler.  LEFT VENTRICLE PLAX 2D LVIDd:         4.37 cm      Diastology LVIDs:         3.49 cm      LV e' lateral:   8.27 cm/s LV PW:         1.42 cm      LV E/e' lateral: 6.3 LV IVS:        1.96 cm       LV e' medial:    4.57 cm/s LVOT diam:     2.00 cm      LV E/e' medial:  11.4 LV SV:         61 LV SV Index:   26 LVOT Area:     3.14 cm  LV Volumes (MOD) LV vol d, MOD A2C: 98.9 ml LV vol d, MOD A4C: 147.0 ml LV vol s, MOD A2C: 65.5 ml LV vol s, MOD A4C: 77.3 ml LV SV MOD A2C:     33.4 ml LV SV MOD A4C:     147.0 ml LV SV MOD BP:      54.6 ml RIGHT VENTRICLE RV Basal diam:  3.08 cm RV S prime:     10.00 cm/s TAPSE (M-mode): 3.7 cm LEFT ATRIUM             Index       RIGHT ATRIUM           Index LA diam:        3.80 cm 1.61 cm/m  RA Area:     26.10 cm LA Vol (A2C):   75.0 ml 31.75 ml/m RA Volume:   79.80 ml  33.78 ml/m LA Vol (A4C):   61.6 ml 26.08 ml/m LA Biplane Vol: 71.4 ml 30.22 ml/m  AORTIC VALVE                   PULMONIC VALVE AV Area (Vmax):    1.70 cm    PV Vmax:        0.82 m/s AV Area (Vmean):   2.00 cm    PV Peak grad:   2.7 mmHg AV Area (VTI):     2.27 cm    RVOT Peak grad: 5 mmHg AV Vmax:           108.00 cm/s AV Vmean:          68.950 cm/s AV VTI:            0.270 m AV Peak Grad:      4.7 mmHg AV Mean Grad:      2.5 mmHg LVOT Vmax:         58.40 cm/s LVOT Vmean:        43.900 cm/s LVOT VTI:          0.195 m LVOT/AV VTI ratio: 0.72  AORTA Ao Root diam: 3.70 cm MITRAL VALVE               TRICUSPID VALVE MV Area (PHT): 3.51 cm    TR Peak grad:   15.5 mmHg MV Decel Time: 216 msec    TR Vmax:        197.00 cm/s MV E velocity: 51.90 cm/s MV A velocity: 95.10 cm/s  SHUNTS MV E/A ratio:  0.55        Systemic VTI:  0.20 m  Systemic Diam: 2.00 cm Serafina Royals MD Electronically signed by Serafina Royals MD Signature Date/Time: 07/29/2019/2:01:02 PM    Final    DG HIP OPERATIVE UNILAT W OR W/O PELVIS RIGHT  Result Date: 08/21/2019 CLINICAL DATA:  RIGHT total hip arthroplasty EXAM: OPERATIVE RIGHT HIP (WITH PELVIS IF PERFORMED) 1 VIEWS TECHNIQUE: Fluoroscopic spot image(s) were submitted for interpretation post-operatively. FLUOROSCOPY TIME:  0 minutes 30 seconds Dose:  10.7 mGy COMPARISON:  None FINDINGS: Bones demineralized. RIGHT hip prosthesis identified. No fracture or dislocation seen. Distal aspect of the femoral component not imaged. IMPRESSION: RIGHT hip prosthesis, visualized portions without acute abnormalities. Electronically Signed   By: Lavonia Dana M.D.   On: 08/21/2019 14:51   DG HIP UNILAT W OR W/O PELVIS 2-3 VIEWS RIGHT  Result Date: 08/21/2019 CLINICAL DATA:  Status post right total hip arthroplasty. EXAM: DG HIP (WITH OR WITHOUT PELVIS) 2-3V RIGHT COMPARISON:  None. FINDINGS: The right femoral and acetabular components appear to be well situated. Expected postoperative changes are noted in the surrounding soft tissues. IMPRESSION: Status post right total hip arthroplasty. Electronically Signed   By: Marijo Conception M.D.   On: 08/21/2019 12:49    Disposition: Home with home health PT     Follow-up Information    Duanne Guess, PA-C Follow up in 2 week(s).   Specialties: Orthopedic Surgery, Emergency Medicine Contact information: [place] [address] [phone]            Signed: Fausto Skillern 08/24/2019, 10:46 AM

## 2019-08-22 NOTE — Progress Notes (Signed)
Physical Therapy Treatment Patient Details Name: Jesse Allison MRN: [id number] DOB: 07/08/1940 Today's Date: 08/22/2019    History of Present Illness Pt admitted for R THR.    PT Comments    Pt is making gradual/limited progress towards goals due to pain. Reports he is "fine" unless he is moving and he describes shooting, burning, and radiating pain from hip->knee. Physically, needs limited assist and demonstrates slow gait due to pain. Good strength/endurance with there-ex and continues to be very motivated to participate. Will continue to progress.   Follow Up Recommendations  Home health PT;Supervision - Intermittent     Equipment Recommendations  3in1 (PT)    Recommendations for Other Services       Precautions / Restrictions Precautions Precautions: Fall;Anterior Hip Precaution Booklet Issued: Yes (comment) Restrictions Weight Bearing Restrictions: Yes RLE Weight Bearing: Weight bearing as tolerated    Mobility  Bed Mobility               General bed mobility comments: not performed as pt in recliner at start/end of session  Transfers Overall transfer level: Needs assistance Equipment used: Rolling walker (2 wheeled) Transfers: Sit to/from Stand Sit to Stand: Min assist;Mod assist         General transfer comment: from lower surface. Cues for sequencing and RW used. Has difficulty with L hand use due to gout flare  Ambulation/Gait Ambulation/Gait assistance: Min guard Gait Distance (Feet): 45 Feet Assistive device: Rolling walker (2 wheeled) Gait Pattern/deviations: Step-to pattern     General Gait Details: very slow step to gait pattern with ambulation. Sporatically, will have shooting pain that he describes as burning that haults his movement. Very tense throughout.   Stairs             Wheelchair Mobility    Modified Rankin (Stroke Patients Only)       Balance Overall balance assessment: Needs assistance;History of  Falls Sitting-balance support: Feet supported;Bilateral upper extremity supported Sitting balance-Leahy Scale: Good     Standing balance support: Bilateral upper extremity supported Standing balance-Leahy Scale: Good                              Cognition Arousal/Alertness: Awake/alert Behavior During Therapy: WFL for tasks assessed/performed Overall Cognitive Status: Within Functional Limits for tasks assessed                                        Exercises Other Exercises Other Exercises: seated ther-ex performed on R LE including AP, glut sets, quad sets, SAQ, hip add squeezes, and hip abd/add. All ther-ex performed x 12 reps with min assist. Reviewed written HEP    General Comments        Pertinent Vitals/Pain Pain Assessment: 0-10 Pain Score: 8  Pain Location: R hip Pain Descriptors / Indicators: Burning;Jabbing;Operative site guarding;Sharp;Shooting;Stabbing Pain Intervention(s): Limited activity within patient's tolerance;Premedicated before session;Repositioned;Ice applied    Home Living                      Prior Function            PT Goals (current goals can now be found in the care plan section) Acute Rehab PT Goals Patient Stated Goal: to go home PT Goal Formulation: With patient Time For Goal Achievement: 09/04/19 Potential to Achieve Goals: Good Progress towards PT  goals: Progressing toward goals    Frequency    BID      PT Plan Current plan remains appropriate    Co-evaluation              AM-PAC PT "6 Clicks" Mobility   Outcome Measure  Help needed turning from your back to your side while in a flat bed without using bedrails?: A Little Help needed moving from lying on your back to sitting on the side of a flat bed without using bedrails?: A Little Help needed moving to and from a bed to a chair (including a wheelchair)?: A Little Help needed standing up from a chair using your arms (e.g.,  wheelchair or bedside chair)?: A Little Help needed to walk in hospital room?: A Lot Help needed climbing 3-5 steps with a railing? : A Lot 6 Click Score: 16    End of Session Equipment Utilized During Treatment: Gait belt Activity Tolerance: Patient limited by pain Patient left: in chair;with chair alarm set;with SCD's reapplied Nurse Communication: Mobility status PT Visit Diagnosis: Muscle weakness (generalized) (M62.81);Difficulty in walking, not elsewhere classified (R26.2);Pain Pain - Right/Left: Right Pain - part of body: Hip     Time: [id number] PT Time Calculation (min) (ACUTE ONLY): 40 min  Charges:  $Gait Training: 23-37 mins $Therapeutic Exercise: 8-22 mins                     [name], PT, DPT [phone]    [name] 08/22/2019, 10:39 AM

## 2019-08-22 NOTE — Evaluation (Signed)
Occupational Therapy Evaluation Patient Details Name: Jesse Allison MRN: [id number] DOB: 05-22-1940 Today's Date: 08/22/2019    History of Present Illness Pt admitted for R THR; presents with swelling and baseline pain of L hand/wrist   Clinical Impression   Pt presents this afternoon sitting up in the recliner with his legs elevated and his wife present. Both were engaged and actively participatory throughout the session. Pt and his wife live in a one level house with a walk-in shower with a built-in bench, a comfort height toilet, and DME ([place] and rollator). They have been actively planning ahead for home accessibility and are looking to install grab bars in their bathroom. Prior to hospitalization, pt used a rollator (beginning in Nov 2020; used [facility] prior) to accommodate for increased RLE pain. He reported that he was independent in ADL, but did not shower unless his wife was home for fear of losing his balance. IADL (driving and cooking) were limited by pain prior to surgery. Pt/caregiver educated on and demonstrated compression stocking mgt, using AE for LBD, showering and DME/AE training. Pt and caregiver were receptive, verbalized information and demonstrated the strategies educated on. Handout provided for reinforcement and carryover. Pt began LB dressing sequence with Min A for threading legs through shorts using a reacher and with increased time. He required Mod A and a RW to stand up from recliner and deferred completing LBD 2/2 pain. He demonstrated unsteadiness standing with RW 2/2 pain and needed some cueing for sequencing. Pt is currently limited by pain and deficits in RLE strength/ROM following surgery, limiting his functional mobility. He is able to engage in seated ADL with supervision-Min A, however he requires Mod A and DME/AE for functional mobility. Pt is a good problem solver and will benefit from skilled acute OT services while in the hospital to maximize engagement in ADL/IADL  and functional mobility and reduce falls risk to promote independence and optimal quality of life. Home health OT recommended pending pt progress.     Follow Up Recommendations  Home health OT    Equipment Recommendations  3 in 1 bedside commode    Recommendations for Other Services       Precautions / Restrictions Precautions Precautions: Fall;Anterior Hip Restrictions Weight Bearing Restrictions: Yes RLE Weight Bearing: Weight bearing as tolerated      Mobility Bed Mobility               General bed mobility comments: not performed as pt in recliner at start/end of session  Transfers Overall transfer level: Needs assistance Equipment used: Rolling walker (2 wheeled) Transfers: Sit to/from Stand Sit to Stand: Mod assist         General transfer comment: Pt reported that this transfer was easier than the one this morning. Cues used for sequencing and hand placement on RW. Pt was unsteady standing with RW and CGA 2/2 pain and returned to seated position in recliner.    Balance Overall balance assessment: Needs assistance(last fall was 2 years ago at the dining room table) Sitting-balance support: Feet supported Sitting balance-Leahy Scale: Good Sitting balance - Comments: Pt maintained steady seated balance while reaching outside of his BOS for urinal and during pericare.   Standing balance support: Bilateral upper extremity supported Standing balance-Leahy Scale: Poor Standing balance comment: Pt unable to maintain standing balance without RW this session 2/2 pain.  ADL either performed or assessed with clinical judgement   ADL Overall ADL's : Needs assistance/impaired Eating/Feeding: With adaptive utensils;Modified independent Eating/Feeding Details (indicate cue type and reason): Pt expressed difficulties with using utensils with L hand (baseline swelling and pain, difficulties closing fist). Provided pt with foam tubing  and educated on use. Pt expressed gratitude and reported increased ease of grasping fork with L hand to engage in eating.         Lower Body Bathing: Sitting/lateral leans;Moderate assistance Lower Body Bathing Details (indicate cue type and reason): Pt demonstrated ability to reach past his knees, with difficulties reaching his lower legs and feet requiring Mod A. He has good BUE strength/ROM for upper legs and knees.     Lower Body Dressing: With adaptive equipment;Moderate assistance;Sit to/from stand Lower Body Dressing Details (indicate cue type and reason): Pt began LBD sequence with Min A for threading legs through shorts using a reacher and with increased time. He required Mod A and a RW to stand up from recliner and deferred completing LBD 2/2 pain. He demonstrated unsteadiness standing with RW 2/2 pain.             Functional mobility during ADLs: Rolling walker;Supervision/safety;Cueing for sequencing;Minimal assistance;Moderate assistance General ADL Comments: Pt participation in functional mobility was limited this session 2/2 pain in R hip. He is a good problem solver and recalled techniques from prior PT session. Pt is able to engage in seated ADL with supervision-Min A, however he requires Mod A and DME/AE for functional mobility.     Vision         Perception     Praxis      Pertinent Vitals/Pain Pain Assessment: 0-10 Pain Score: 7 (7/10 when standing; 6/10 sitting in chair) Pain Location: R hip Pain Descriptors / Indicators: Burning;Jabbing;Operative site guarding;Sharp;Shooting;Stabbing Pain Intervention(s): Limited activity within patient's tolerance;Monitored during session;Premedicated before session;Repositioned     Hand Dominance Left   Extremity/Trunk Assessment Upper Extremity Assessment Upper Extremity Assessment: Overall WFL for tasks assessed(Pt reported baseline swelling and pain in L hand that limits him from closing his fist (writing, eating w/  spoon); currently controlled by hospital pain meds)   Lower Extremity Assessment Lower Extremity Assessment: RLE deficits/detail RLE Deficits / Details: Anticipated post-op deficits in RLE strength/ROM; limited by pain with movement       Communication Communication Communication: No difficulties   Cognition Arousal/Alertness: Awake/alert Behavior During Therapy: WFL for tasks assessed/performed Overall Cognitive Status: Within Functional Limits for tasks assessed                                 General Comments: Pt and caregiver both great problem solvers and thinking ahead about discharge care mgt   General Comments  L hand was swollen (per pt report, this is prior to hospitalization)    Exercises Other Exercises Other Exercises: Pt/caregiver educated on and demonstrated comprssion stocking mgt, using AE for LBD, showering and DME/AE information and training. Pt and caregiver both were receptive, verbalized information and demonstrated the strategies educated on. Handout provided for reinforcement and carryover.   Shoulder Instructions      Home Living Family/patient expects to be discharged to:: Private residence Living Arrangements: Spouse/significant other Available Help at Discharge: Family Type of Home: House Home Access: Stairs to enter Technical brewer of Steps: 2 Entrance Stairs-Rails: None Home Layout: One level     Bathroom Shower/Tub: Walk-in shower(with four inch lip)  Bathroom Toilet: (comfort height)     Home Equipment: Cane - single point;Walker - 4 wheels   Additional Comments: Pt and caregiver interested in bed rail for home      Prior Functioning/Environment Level of Independence: Independent with assistive device(s)        Comments: Ambulated household distances with RW due to pain (starting in Nov 2020); previously used a single point cane.        OT Problem List: Decreased strength;Decreased coordination;Decreased  range of motion;Decreased activity tolerance;Impaired balance (sitting and/or standing);Pain      OT Treatment/Interventions: Self-care/ADL training;Therapeutic exercise;Therapeutic activities;DME and/or AE instruction;Patient/family education;Balance training    OT Goals(Current goals can be found in the care plan section) Acute Rehab OT Goals Patient Stated Goal: To have reduced pain so that he can get around without a walker OT Goal Formulation: With patient/family Time For Goal Achievement: 09/05/19 Potential to Achieve Goals: Good ADL Goals Pt Will Perform Lower Body Bathing: sit to/from stand;with adaptive equipment;with min assist(Min A for reaching lower legs and feet) Pt Will Perform Lower Body Dressing: sit to/from stand;with adaptive equipment;with modified independence(Min guard assist for sit to stand with RW) Pt Will Transfer to Toilet: regular height toilet;bedside commode;ambulating;with modified independence(BSC over toilet; using LRAD) Additional ADL Goal #1: Caregiver will independently don/doff compression stockings to maximize post-op recovery  OT Frequency: Min 1X/week   Barriers to D/C:            Co-evaluation              AM-PAC OT "6 Clicks" Daily Activity     Outcome Measure Help from another person eating meals?: None Help from another person taking care of personal grooming?: None Help from another person toileting, which includes using toliet, bedpan, or urinal?: None Help from another person bathing (including washing, rinsing, drying)?: A Little Help from another person to put on and taking off regular upper body clothing?: None Help from another person to put on and taking off regular lower body clothing?: A Lot 6 Click Score: 21   End of Session Equipment Utilized During Treatment: Gait belt;Rolling walker  Activity Tolerance: Patient limited by pain(deferred functional mobility and completion of LBD 2/2 pain) Patient left: in chair;with  call bell/phone within reach;with chair alarm set;with family/visitor present  OT Visit Diagnosis: Other abnormalities of gait and mobility (R26.89);Pain Pain - Right/Left: Right Pain - part of body: Hip                Time: [id number] OT Time Calculation (min): 67 min Charges:  OT Evaluation $OT Eval Moderate Complexity: 1 Mod OT Treatments $Self Care/Home Management : 53-67 mins  Jerilynn Birkenhead, OTS 08/22/19, 4:19 PM

## 2019-08-23 LAB — CBC
HCT: 33.2 % — ABNORMAL LOW (ref 39.0–52.0)
Hemoglobin: 11.3 g/dL — ABNORMAL LOW (ref 13.0–17.0)
MCH: 30.8 pg (ref 26.0–34.0)
MCHC: 34 g/dL (ref 30.0–36.0)
MCV: 90.5 fL (ref 80.0–100.0)
Platelets: 258 10*3/uL (ref 150–400)
RBC: 3.67 MIL/uL — ABNORMAL LOW (ref 4.22–5.81)
RDW: 12.8 % (ref 11.5–15.5)
WBC: 12.6 10*3/uL — ABNORMAL HIGH (ref 4.0–10.5)
nRBC: 0 % (ref 0.0–0.2)

## 2019-08-23 LAB — BASIC METABOLIC PANEL
Anion gap: 7 (ref 5–15)
BUN: 15 mg/dL (ref 8–23)
CO2: 30 mmol/L (ref 22–32)
Calcium: 8.7 mg/dL — ABNORMAL LOW (ref 8.9–10.3)
Chloride: 99 mmol/L (ref 98–111)
Creatinine, Ser: 0.71 mg/dL (ref 0.61–1.24)
GFR calc Af Amer: >60 mL/min (ref >60)
GFR calc non Af Amer: >60 mL/min (ref >60)
Glucose, Bld: 158 mg/dL — ABNORMAL HIGH (ref 70–99)
Potassium: 4.2 mmol/L (ref 3.5–5.1)
Sodium: 136 mmol/L (ref 135–145)

## 2019-08-23 MED ORDER — HYDRALAZINE HCL 10 MG PO TABS
10.0000 mg | ORAL_TABLET | Freq: Once | ORAL | Status: AC
  Administered 2019-08-23: 10 mg via ORAL
  Filled 2019-08-23: qty 1

## 2019-08-23 MED ORDER — HYDRALAZINE HCL 10 MG PO TABS
10.0000 mg | ORAL_TABLET | Freq: Four times a day (QID) | ORAL | Status: DC | PRN
  Administered 2019-08-24: 10 mg via ORAL
  Filled 2019-08-23 (×2): qty 1

## 2019-08-23 NOTE — Progress Notes (Signed)
Physical Therapy Treatment Patient Details Name: Jesse Allison MRN: [id number] DOB: Jan 03, 1941 Today's Date: 08/23/2019    History of Present Illness 79 y/o male s/p R THR; presents with swelling and baseline pain of L hand/wrist    PT Comments    Pt continues to make good gains.  He was able to rise w/o assist from recliner, circumambulated the nurses station (guarded, walker reliant gait) is showing increased tolerance and strength with exercises and this AM was able to negotiate up/down steps w/o rails. Pt expects to d/c tomorrow and from a PT stand-point he is safe to do so.   Follow Up Recommendations  Home health PT;Supervision - Intermittent     Equipment Recommendations  3in1 (PT);Rolling walker with 5" wheels    Recommendations for Other Services       Precautions / Restrictions Precautions Precautions: Fall;Anterior Hip Restrictions RLE Weight Bearing: Weight bearing as tolerated    Mobility  Bed Mobility               General bed mobility comments: pt continues to prefer staying in recliner   Transfers Overall transfer level: Modified independent Equipment used: Rolling walker (2 wheeled) Transfers: Sit to/from Stand Sit to Stand: Min guard         General transfer comment: extra cuing for sequencing and set up, able to rise w/o direct assist  Ambulation/Gait Ambulation/Gait assistance: Min guard Gait Distance (Feet): 200 Feet Assistive device: Rolling walker (2 wheeled)       General Gait Details: Pt motivated to circumambulate the nurses station, pt with limp and only minimal times where he was able to maintain consistent cadence.  Heavy walker reliance, consistent vitals.   Stairs             Wheelchair Mobility    Modified Rankin (Stroke Patients Only)       Balance Overall balance assessment: Needs assistance Sitting-balance support: Feet supported Sitting balance-Leahy Scale: Good Sitting balance - Comments: Pt  maintained steady seated balance while reaching outside of his BOS for urinal and during pericare.   Standing balance support: Bilateral upper extremity supported Standing balance-Leahy Scale: Fair                              Cognition Arousal/Alertness: Awake/alert Behavior During Therapy: WFL for tasks assessed/performed Overall Cognitive Status: Within Functional Limits for tasks assessed                                        Exercises Total Joint Exercises Ankle Circles/Pumps: AROM;10 reps Quad Sets: Strengthening;10 reps Heel Slides: AROM;10 reps Hip ABduction/ADduction: Strengthening;10 reps Straight Leg Raises: AAROM;5 reps Long Arc Quad: Strengthening;10 reps    General Comments        Pertinent Vitals/Pain Pain Assessment: 0-10 Pain Score: 5  Pain Location: R lateral thigh more than hip    Home Living                      Prior Function            PT Goals (current goals can now be found in the care plan section) Progress towards PT goals: Progressing toward goals    Frequency    BID      PT Plan Current plan remains appropriate    Co-evaluation  AM-PAC PT "6 Clicks" Mobility   Outcome Measure  Help needed turning from your back to your side while in a flat bed without using bedrails?: A Little Help needed moving from lying on your back to sitting on the side of a flat bed without using bedrails?: A Little Help needed moving to and from a bed to a chair (including a wheelchair)?: A Little Help needed standing up from a chair using your arms (e.g., wheelchair or bedside chair)?: A Little Help needed to walk in hospital room?: A Little Help needed climbing 3-5 steps with a railing? : A Little 6 Click Score: 18    End of Session Equipment Utilized During Treatment: Gait belt Activity Tolerance: Patient limited by pain Patient left: with chair alarm set;with SCD's reapplied;with  family/visitor present;with call bell/phone within reach Nurse Communication: Mobility status PT Visit Diagnosis: Muscle weakness (generalized) (M62.81);Difficulty in walking, not elsewhere classified (R26.2);Pain Pain - Right/Left: Right Pain - part of body: Hip     Time: [id number] PT Time Calculation (min) (ACUTE ONLY): 27 min  Charges:  $Gait Training: 8-22 mins $Therapeutic Exercise: 8-22 mins                     Kreg Shropshire, DPT 08/23/2019, 5:53 PM

## 2019-08-23 NOTE — Plan of Care (Signed)
  Problem: Education: Goal: Knowledge of General Education information will improve Description: Including pain rating scale, medication(s)/side effects and non-pharmacologic comfort measures Outcome: Progressing   Problem: Clinical Measurements: Goal: Ability to maintain clinical measurements within normal limits will improve Outcome: Progressing Goal: Diagnostic test results will improve Outcome: Progressing   Problem: Coping: Goal: Level of anxiety will decrease Outcome: Progressing   Problem: Activity: Goal: Ability to avoid complications of mobility impairment will improve Outcome: Progressing   Problem: Pain Management: Goal: Pain level will decrease with appropriate interventions Outcome: Progressing  Elevated blood pressure during shift systolic [id number], orders were placed, and medications were given.

## 2019-08-23 NOTE — Progress Notes (Signed)
At 0517am paged Dr. Posey Pronto, [place] about the patient's blood pressure 173/83. At 0518am received call back from MD, MD placed orders for Hydralazine 10mg  IV once. There is a shortage on IV hydralazine. Unable to order IV hydralazine due to shortage. Po hydralazine ordered.

## 2019-08-23 NOTE — Progress Notes (Signed)
Physical Therapy Treatment Patient Details Name: Jesse Allison MRN: [id number] DOB: 1940-08-11 Today's Date: 08/23/2019    History of Present Illness 79 y/o male s/p R THR; presents with swelling and baseline pain of L hand/wrist    PT Comments    Pt with best PT session thus far, he was able to ambulate >100 ft, negotiated up/down 2 steps w/o rail and showed increased tolerance and strength with exercises.  He c/o more pain in lateral thigh than at incision site (and does have a few bouts of catching pain during standing) but again showed great effort and significant functional improvement.  Plan to do full loop this afternoon.  Pt has had elevated BP issues, asymptomatic t/o session (did have 170s/90s BP at start of session), has gotten meds.  Follow Up Recommendations  Home health PT;Supervision - Intermittent     Equipment Recommendations  3in1 (PT);Rolling walker with 5" wheels    Recommendations for Other Services       Precautions / Restrictions Precautions Precautions: Fall;Anterior Hip Restrictions RLE Weight Bearing: Weight bearing as tolerated    Mobility  Bed Mobility               General bed mobility comments: not performed as pt in recliner at start/end of session  Transfers Overall transfer level: Needs assistance Equipment used: Rolling walker (2 wheeled) Transfers: Sit to/from Stand Sit to Stand: Min assist         General transfer comment: Cuing for set up, hand placement, sequencing and general encouragement, good effort w/o assist but ultimately did need phyiscal assist to rise  Ambulation/Gait Ambulation/Gait assistance: Min guard Gait Distance (Feet): 110 Feet Assistive device: Rolling walker (2 wheeled)       General Gait Details: Pt again with slow, guarded ambulation to start, but did show signficant improvement with increased time/distance.  He was able to take reciprocal steps with (slow but) consistent walker motion from time to  time.  Definite improvement per all previous ambulation attempts   Stairs Stairs: Yes Stairs assistance: Min guard Stair Management: No rails;Step to pattern;Backwards Number of Stairs: 2 General stair comments: Pt was able to negotiate up/down steps w/o physical assist, repeated cuing for walker use/safety and sequencing.   Wheelchair Mobility    Modified Rankin (Stroke Patients Only)       Balance Overall balance assessment: Needs assistance   Sitting balance-Leahy Scale: Good     Standing balance support: Bilateral upper extremity supported Standing balance-Leahy Scale: Fair                              Cognition Arousal/Alertness: Awake/alert Behavior During Therapy: WFL for tasks assessed/performed Overall Cognitive Status: Within Functional Limits for tasks assessed                                        Exercises Total Joint Exercises Ankle Circles/Pumps: AROM;10 reps Quad Sets: Strengthening;10 reps Heel Slides: AROM;10 reps(with resisted extensions) Hip ABduction/ADduction: Strengthening;10 reps Straight Leg Raises: AAROM;5 reps Long Arc Quad: Strengthening;10 reps    General Comments        Pertinent Vitals/Pain Pain Score: 6     Home Living                      Prior Function  PT Goals (current goals can now be found in the care plan section) Progress towards PT goals: Progressing toward goals    Frequency    BID      PT Plan Current plan remains appropriate    Co-evaluation              AM-PAC PT "6 Clicks" Mobility   Outcome Measure  Help needed turning from your back to your side while in a flat bed without using bedrails?: A Little Help needed moving from lying on your back to sitting on the side of a flat bed without using bedrails?: A Little Help needed moving to and from a bed to a chair (including a wheelchair)?: A Little Help needed standing up from a chair using your  arms (e.g., wheelchair or bedside chair)?: A Little Help needed to walk in hospital room?: A Little Help needed climbing 3-5 steps with a railing? : A Little 6 Click Score: 18    End of Session Equipment Utilized During Treatment: Gait belt Activity Tolerance: Patient limited by pain Patient left: with chair alarm set;with SCD's reapplied;with family/visitor present;with call bell/phone within reach Nurse Communication: Mobility status PT Visit Diagnosis: Muscle weakness (generalized) (M62.81);Difficulty in walking, not elsewhere classified (R26.2);Pain Pain - Right/Left: Right Pain - part of body: Hip     Time: 1012-1058 PT Time Calculation (min) (ACUTE ONLY): 46 min  Charges:  $Gait Training: 8-22 mins $Therapeutic Exercise: 8-22 mins $Therapeutic Activity: 8-22 mins                     Kreg Shropshire, DPT 08/23/2019, 11:10 AM

## 2019-08-23 NOTE — Progress Notes (Signed)
  Subjective: 2 Days Post-Op Procedure(s) (LRB): TOTAL HIP ARTHROPLASTY ANTERIOR APPROACH (Right) Patient reports pain as well-controlled.   Patient is well, but has had some minor complaints of chest pain, nausea, hiccups, and dizziness immediately after he began eating this AM. His symptoms have all resolved at this point. He also complains of constipation as he has tried to have a bowel movement twice withotu success. Plan is to go Home after hospital stay. Negative for chest pain and shortness of breath Fever: no Gastrointestinal:Positive for nausea as noted above, no vomiting.  Patient has not had a bowel movement.  Objective: Vital signs in last 24 hours: Temp:  [97.6 F (36.4 C)-98.3 F (36.8 C)] 98.3 F (36.8 C) (05/15 0119) Pulse Rate:  [60-82] 75 (05/15 0508) Resp:  [14-16] 16 (05/15 0119) BP: (155-179)/(72-87) 173/83 (05/15 0508) SpO2:  [95 %-98 %] 95 % (05/15 0508)  Intake/Output from previous day:  Intake/Output Summary (Last 24 hours) at 08/23/2019 1004 Last data filed at 08/23/2019 0600 Gross per 24 hour  Intake 120 ml  Output 1320 ml  Net -1200 ml    Intake/Output this shift: No intake/output data recorded.  Labs: Recent Labs    08/22/19 0434 08/23/19 0520  HGB 11.9* 11.3*   Recent Labs    08/22/19 0434 08/23/19 0520  WBC 10.7* 12.6*  RBC 3.95* 3.67*  HCT 37.0* 33.2*  PLT 253 258   Recent Labs    08/21/19 1533 08/23/19 0520  NA 134* 136  K 4.7 4.2  CL 101 99  CO2 26 30  BUN 13 15  CREATININE 0.82 0.71  GLUCOSE 179* 158*  CALCIUM 8.6* 8.7*   No results for input(s): LABPT, INR in the last 72 hours.   EXAM General - Patient is Alert, Appropriate and Oriented Extremity - Neurovascular intact Dorsiflexion/Plantar flexion intact Compartment soft Dressing/Incision -clean, dry, Praveena in place and working  Motor Function - intact, moving foot and toes well on exam.  Cardiovascular- Regular rate and rhythm, no  murmurs/rubs/gallops Respiratory- Faint crackles heard in left lower field, otherwise clear toauscultation bilaterally.  Gastrointestinal- soft, distended and active bowel sounds   Assessment/Plan: 2 Days Post-Op Procedure(s) (LRB): TOTAL HIP ARTHROPLASTY ANTERIOR APPROACH (Right) Active Problems:   Status post total hip replacement, right  Estimated body mass index is 34.99 kg/m as calculated from the following:   Height as of this encounter: 6' (1.829 m).   Weight as of this encounter: 117 kg. Advance diet Up with therapy  Miralax ordered.  Transient pain felt to be due to indigestion, non cardiac in nature. N intervention at this time.   DVT Prophylaxis - foot pumps and SCDs, Aspirin 81 mg BID  Weight-Bearing as tolerated to right leg  Cassell Smiles, PA-C [facility] 08/23/2019, 10:04 AM

## 2019-08-24 MED ORDER — METHOCARBAMOL 500 MG PO TABS: 500.0000 mg | ORAL_TABLET | Freq: Four times a day (QID) | ORAL | 0 refills | Status: DC | PRN

## 2019-08-24 MED ORDER — HYDROCODONE-ACETAMINOPHEN 7.5-325 MG PO TABS: 1.0000 | ORAL_TABLET | ORAL | 0 refills | Status: DC | PRN

## 2019-08-24 NOTE — Progress Notes (Signed)
Physical Therapy Treatment Patient Details Name: Jesse Allison MRN: [id number] DOB: Aug 27, 1940 Today's Date: 08/24/2019    History of Present Illness 79 y/o male s/p R THR; presents with swelling and baseline pain of L hand/wrist    PT Comments    Pt eager to go home, requested further clarification on in/out of bed strategies.  Performed and educated, pt does have rails set up at home with makes this easier.  Pt did need light assist, but he (and wife) showed good understanding.  Pt also able to perform multiple 8" curb negotiation strategies to simulate entering his home, wife again present and clear on appropriate sequencing and strategies.  Pt has made consistent gains, has been able to manage in-home distance ambulation confidently and safety and should be able to d/c home safety as planned.  (of note standard BSC is too narrow for him, needs wide one - discussed with social work).  Follow Up Recommendations  Home health PT;Supervision - Intermittent     Equipment Recommendations  Rolling walker with 5" wheels(wide BSC)    Recommendations for Other Services       Precautions / Restrictions Precautions Precautions: Fall;Anterior Hip Restrictions RLE Weight Bearing: Weight bearing as tolerated    Mobility  Bed Mobility Overal bed mobility: Needs Assistance Bed Mobility: Supine to Sit;Sit to Supine     Supine to sit: Min assist Sit to supine: Min guard   General bed mobility comments: 2 trials of in/out of bed with rails (pt does have rails at home).  Pt needed min assist getting into bed first time, only CGA second time, though needed extra cuing and time to fully get into bed and scoot to appropraite position.  With rails he was able to get up and out of bed (again with extra time and effort) w/o direct assist  Transfers Overall transfer level: Modified independent Equipment used: Rolling walker (2 wheeled) Transfers: Sit to/from Stand Sit to Stand: Min guard          General transfer comment: Pt able to rise multiple times this session w/o direct assist and with less cuing for set up and hand placement  Ambulation/Gait Ambulation/Gait assistance: Min guard Gait Distance (Feet): 100 Feet Assistive device: Rolling walker (2 wheeled)       General Gait Details: Pt has been up to the bathroom multiple times this AM and we trialed steps before prolonged ambulation.  He deferred prolonged ambulation to insure he was not too tired on return to home this later today.   Stairs Stairs: Yes Stairs assistance: Min guard Stair Management: No rails Number of Stairs: 1 General stair comments: multiple trials for single step negotiation forward and backward - (has 4" step at home, practiced on >8" step here) He was able to do each strategy without direct assist, though much more than an 8" step would have been tough.  Wife present and clear on appropriate strategies as well.    Wheelchair Mobility    Modified Rankin (Stroke Patients Only)       Balance Overall balance assessment: Needs assistance Sitting-balance support: Feet supported Sitting balance-Leahy Scale: Good     Standing balance support: Bilateral upper extremity supported Standing balance-Leahy Scale: Good Standing balance comment: reliant on walker, but no LOBs/safety issues with standing acts                            Cognition Arousal/Alertness: Awake/alert Behavior During Therapy: WFL for  tasks assessed/performed Overall Cognitive Status: Within Functional Limits for tasks assessed                                        Exercises Total Joint Exercises Ankle Circles/Pumps: AROM;10 reps Hip ABduction/ADduction: Strengthening;10 reps Long Arc Quad: Strengthening;10 reps    General Comments        Pertinent Vitals/Pain Pain Assessment: 0-10 Pain Score: 5  Pain Location: R lateral thigh more than hip    Home Living                       Prior Function            PT Goals (current goals can now be found in the care plan section) Progress towards PT goals: Progressing toward goals    Frequency    BID      PT Plan Current plan remains appropriate    Co-evaluation              AM-PAC PT "6 Clicks" Mobility   Outcome Measure  Help needed turning from your back to your side while in a flat bed without using bedrails?: A Little Help needed moving from lying on your back to sitting on the side of a flat bed without using bedrails?: A Little Help needed moving to and from a bed to a chair (including a wheelchair)?: A Little Help needed standing up from a chair using your arms (e.g., wheelchair or bedside chair)?: A Little Help needed to walk in hospital room?: A Little Help needed climbing 3-5 steps with a railing? : A Little 6 Click Score: 18    End of Session Equipment Utilized During Treatment: Gait belt Activity Tolerance: Patient tolerated treatment well Patient left: with chair alarm set;with SCD's reapplied;with family/visitor present;with call bell/phone within reach Nurse Communication: Mobility status PT Visit Diagnosis: Muscle weakness (generalized) (M62.81);Difficulty in walking, not elsewhere classified (R26.2);Pain Pain - Right/Left: Right Pain - part of body: Hip     Time: [id number] PT Time Calculation (min) (ACUTE ONLY): 42 min  Charges:  $Gait Training: 8-22 mins $Therapeutic Exercise: 8-22 mins $Therapeutic Activity: 8-22 mins                     Kreg Shropshire, DPT 08/24/2019, 2:03 PM

## 2019-08-24 NOTE — Progress Notes (Signed)
Discharge Note:  Reviewed med list and discharge instructions with pt and spouse.  Pt ad spouse verbalized understanding. Prevena wound vac in place. BLE ted hose on. Obtained vitals noted. Pt leaving facility with a portable BSC.  Staff wheeled Pt out. Spouse transported pt via private vehicle to home.

## 2019-08-24 NOTE — Progress Notes (Signed)
  Subjective: 3 Days Post-Op Procedure(s) (LRB): TOTAL HIP ARTHROPLASTY ANTERIOR APPROACH (Right) Patient reports pain as well-controlled.   Patient is well, and has had no acute complaints or problems Plan is to go Home after hospital stay. Negative for chest pain and shortness of breath Fever: no Gastrointestinal: negative for nausea and vomiting.   Patient has had a bowel movement.  Objective: Vital signs in last 24 hours: Temp:  [97.6 F (36.4 C)-98.1 F (36.7 C)] 97.6 F (36.4 C) (05/16 0732) Pulse Rate:  [66-85] 85 (05/16 0847) Resp:  [13-18] 18 (05/16 0732) BP: (149-191)/(65-92) 150/82 (05/16 0941) SpO2:  [93 %-96 %] 96 % (05/16 0732)  Intake/Output from previous day:  Intake/Output Summary (Last 24 hours) at 08/24/2019 1203 Last data filed at 08/24/2019 0600 Gross per 24 hour  Intake 150 ml  Output 390 ml  Net -240 ml    Intake/Output this shift: No intake/output data recorded.  Labs: Recent Labs    08/22/19 0434 08/23/19 0520  HGB 11.9* 11.3*   Recent Labs    08/22/19 0434 08/23/19 0520  WBC 10.7* 12.6*  RBC 3.95* 3.67*  HCT 37.0* 33.2*  PLT 253 258   Recent Labs    08/21/19 1533 08/23/19 0520  NA 134* 136  K 4.7 4.2  CL 101 99  CO2 26 30  BUN 13 15  CREATININE 0.82 0.71  GLUCOSE 179* 158*  CALCIUM 8.6* 8.7*   No results for input(s): LABPT, INR in the last 72 hours.   EXAM General - Patient is Alert, Appropriate and Oriented Extremity - Neurovascular intact Dorsiflexion/Plantar flexion intact Compartment soft Dressing/Incision -clean, dry, no drainage, Preveena in place and working; blister formation noted around the lateral distal aspect of the wound vac Motor Function - intact, moving foot and toes well on exam.  Cardiovascular- Regular rate and rhythm, no murmurs/rubs/gallops Respiratory- Lungs clear to auscultation bilaterally Gastrointestinal- soft and nontender   Assessment/Plan: 3 Days Post-Op Procedure(s) (LRB): TOTAL HIP  ARTHROPLASTY ANTERIOR APPROACH (Right) Active Problems:   Status post total hip replacement, right  Estimated body mass index is 34.99 kg/m as calculated from the following:   Height as of this encounter: 6' (1.829 m).   Weight as of this encounter: 117 kg. Discharge home with home health    DVT Prophylaxis - Ted hose and SCDs, ASA, Plavix Weight-Bearing as tolerated to right leg  Cassell Smiles, PA-C [facility] 08/24/2019, 12:03 PM

## 2019-08-24 NOTE — Plan of Care (Signed)
  Problem: Education: Goal: Knowledge of General Education information will improve Description: Including pain rating scale, medication(s)/side effects and non-pharmacologic comfort measures Outcome: Progressing   Problem: Clinical Measurements: Goal: Ability to maintain clinical measurements within normal limits will improve Outcome: Progressing Goal: Will remain free from infection Outcome: Progressing Goal: Diagnostic test results will improve Outcome: Progressing   Problem: Activity: Goal: Risk for activity intolerance will decrease Outcome: Progressing   Problem: Coping: Goal: Level of anxiety will decrease Outcome: Progressing   Problem: Pain Managment: Goal: General experience of comfort will improve Outcome: Progressing

## 2019-08-24 NOTE — Progress Notes (Signed)
Pt is still with elevating BP this morning. [id number], 177/65 on left arm, 180/79 on Right arm Last dose of hydralazine given at 03:59. it is not time for the next dose yet.  PA made aware at 08:15.  Manually checking BP:150/82.  Per PA, Pt's BP is okay. No need for next dose of hydralazine. Give time for tramadol and coreg to kick in.  No new order at this time. Will monitor.

## 2019-08-25 DIAGNOSIS — I10 Essential (primary) hypertension: Secondary | ICD-10-CM | POA: Diagnosis not present

## 2019-08-25 DIAGNOSIS — Z8521 Personal history of malignant neoplasm of larynx: Secondary | ICD-10-CM | POA: Diagnosis not present

## 2019-08-25 DIAGNOSIS — Z471 Aftercare following joint replacement surgery: Secondary | ICD-10-CM | POA: Diagnosis not present

## 2019-08-25 DIAGNOSIS — I252 Old myocardial infarction: Secondary | ICD-10-CM | POA: Diagnosis not present

## 2019-08-25 DIAGNOSIS — K219 Gastro-esophageal reflux disease without esophagitis: Secondary | ICD-10-CM | POA: Diagnosis not present

## 2019-08-25 DIAGNOSIS — M109 Gout, unspecified: Secondary | ICD-10-CM | POA: Diagnosis not present

## 2019-08-25 DIAGNOSIS — M4726 Other spondylosis with radiculopathy, lumbar region: Secondary | ICD-10-CM | POA: Diagnosis not present

## 2019-08-25 DIAGNOSIS — D509 Iron deficiency anemia, unspecified: Secondary | ICD-10-CM | POA: Diagnosis not present

## 2019-08-25 DIAGNOSIS — F419 Anxiety disorder, unspecified: Secondary | ICD-10-CM | POA: Diagnosis not present

## 2019-08-25 DIAGNOSIS — M48062 Spinal stenosis, lumbar region with neurogenic claudication: Secondary | ICD-10-CM | POA: Diagnosis not present

## 2019-08-25 DIAGNOSIS — E039 Hypothyroidism, unspecified: Secondary | ICD-10-CM | POA: Diagnosis not present

## 2019-08-25 DIAGNOSIS — R32 Unspecified urinary incontinence: Secondary | ICD-10-CM | POA: Diagnosis not present

## 2019-08-25 DIAGNOSIS — J449 Chronic obstructive pulmonary disease, unspecified: Secondary | ICD-10-CM | POA: Diagnosis not present

## 2019-08-25 DIAGNOSIS — Z6834 Body mass index (BMI) 34.0-34.9, adult: Secondary | ICD-10-CM | POA: Diagnosis not present

## 2019-08-25 DIAGNOSIS — Z7901 Long term (current) use of anticoagulants: Secondary | ICD-10-CM | POA: Diagnosis not present

## 2019-08-25 DIAGNOSIS — E1151 Type 2 diabetes mellitus with diabetic peripheral angiopathy without gangrene: Secondary | ICD-10-CM | POA: Diagnosis not present

## 2019-08-25 DIAGNOSIS — Z8673 Personal history of transient ischemic attack (TIA), and cerebral infarction without residual deficits: Secondary | ICD-10-CM | POA: Diagnosis not present

## 2019-08-25 DIAGNOSIS — E785 Hyperlipidemia, unspecified: Secondary | ICD-10-CM | POA: Diagnosis not present

## 2019-08-25 DIAGNOSIS — K5790 Diverticulosis of intestine, part unspecified, without perforation or abscess without bleeding: Secondary | ICD-10-CM | POA: Diagnosis not present

## 2019-08-25 DIAGNOSIS — I2510 Atherosclerotic heart disease of native coronary artery without angina pectoris: Secondary | ICD-10-CM | POA: Diagnosis not present

## 2019-08-25 DIAGNOSIS — E538 Deficiency of other specified B group vitamins: Secondary | ICD-10-CM | POA: Diagnosis not present

## 2019-08-25 DIAGNOSIS — Z96641 Presence of right artificial hip joint: Secondary | ICD-10-CM | POA: Diagnosis not present

## 2019-08-25 DIAGNOSIS — G4700 Insomnia, unspecified: Secondary | ICD-10-CM | POA: Diagnosis not present

## 2019-08-25 LAB — SURGICAL PATHOLOGY

## 2019-09-04 ENCOUNTER — Ambulatory Visit: Payer: PPO | Admitting: Podiatry

## 2019-09-09 DIAGNOSIS — D509 Iron deficiency anemia, unspecified: Secondary | ICD-10-CM | POA: Diagnosis not present

## 2019-09-09 DIAGNOSIS — M4726 Other spondylosis with radiculopathy, lumbar region: Secondary | ICD-10-CM | POA: Diagnosis not present

## 2019-09-09 DIAGNOSIS — R21 Rash and other nonspecific skin eruption: Secondary | ICD-10-CM | POA: Diagnosis not present

## 2019-09-09 DIAGNOSIS — I2510 Atherosclerotic heart disease of native coronary artery without angina pectoris: Secondary | ICD-10-CM | POA: Diagnosis not present

## 2019-09-09 DIAGNOSIS — G4700 Insomnia, unspecified: Secondary | ICD-10-CM | POA: Diagnosis not present

## 2019-09-09 DIAGNOSIS — M109 Gout, unspecified: Secondary | ICD-10-CM | POA: Diagnosis not present

## 2019-09-09 DIAGNOSIS — I252 Old myocardial infarction: Secondary | ICD-10-CM | POA: Diagnosis not present

## 2019-09-09 DIAGNOSIS — E119 Type 2 diabetes mellitus without complications: Secondary | ICD-10-CM | POA: Insufficient documentation

## 2019-09-09 DIAGNOSIS — E538 Deficiency of other specified B group vitamins: Secondary | ICD-10-CM | POA: Diagnosis not present

## 2019-09-09 DIAGNOSIS — E1151 Type 2 diabetes mellitus with diabetic peripheral angiopathy without gangrene: Secondary | ICD-10-CM | POA: Diagnosis not present

## 2019-09-09 DIAGNOSIS — Z8521 Personal history of malignant neoplasm of larynx: Secondary | ICD-10-CM | POA: Diagnosis not present

## 2019-09-09 DIAGNOSIS — K5790 Diverticulosis of intestine, part unspecified, without perforation or abscess without bleeding: Secondary | ICD-10-CM | POA: Diagnosis not present

## 2019-09-09 DIAGNOSIS — I714 Abdominal aortic aneurysm, without rupture: Secondary | ICD-10-CM | POA: Diagnosis not present

## 2019-09-09 DIAGNOSIS — E039 Hypothyroidism, unspecified: Secondary | ICD-10-CM | POA: Diagnosis not present

## 2019-09-09 DIAGNOSIS — D692 Other nonthrombocytopenic purpura: Secondary | ICD-10-CM | POA: Diagnosis not present

## 2019-09-09 DIAGNOSIS — Z7901 Long term (current) use of anticoagulants: Secondary | ICD-10-CM | POA: Diagnosis not present

## 2019-09-09 DIAGNOSIS — I739 Peripheral vascular disease, unspecified: Secondary | ICD-10-CM | POA: Diagnosis not present

## 2019-09-09 DIAGNOSIS — I10 Essential (primary) hypertension: Secondary | ICD-10-CM | POA: Diagnosis not present

## 2019-09-09 DIAGNOSIS — J449 Chronic obstructive pulmonary disease, unspecified: Secondary | ICD-10-CM | POA: Diagnosis not present

## 2019-09-09 DIAGNOSIS — Z8673 Personal history of transient ischemic attack (TIA), and cerebral infarction without residual deficits: Secondary | ICD-10-CM | POA: Diagnosis not present

## 2019-09-09 DIAGNOSIS — Z471 Aftercare following joint replacement surgery: Secondary | ICD-10-CM | POA: Diagnosis not present

## 2019-09-09 DIAGNOSIS — E785 Hyperlipidemia, unspecified: Secondary | ICD-10-CM | POA: Diagnosis not present

## 2019-09-09 DIAGNOSIS — Z96641 Presence of right artificial hip joint: Secondary | ICD-10-CM | POA: Diagnosis not present

## 2019-09-09 DIAGNOSIS — K219 Gastro-esophageal reflux disease without esophagitis: Secondary | ICD-10-CM | POA: Diagnosis not present

## 2019-09-09 DIAGNOSIS — F419 Anxiety disorder, unspecified: Secondary | ICD-10-CM | POA: Diagnosis not present

## 2019-09-09 DIAGNOSIS — R32 Unspecified urinary incontinence: Secondary | ICD-10-CM | POA: Diagnosis not present

## 2019-09-09 DIAGNOSIS — M48062 Spinal stenosis, lumbar region with neurogenic claudication: Secondary | ICD-10-CM | POA: Diagnosis not present

## 2019-09-09 DIAGNOSIS — J439 Emphysema, unspecified: Secondary | ICD-10-CM | POA: Diagnosis not present

## 2019-09-09 DIAGNOSIS — Z6834 Body mass index (BMI) 34.0-34.9, adult: Secondary | ICD-10-CM | POA: Diagnosis not present

## 2019-09-11 DIAGNOSIS — Z8673 Personal history of transient ischemic attack (TIA), and cerebral infarction without residual deficits: Secondary | ICD-10-CM | POA: Diagnosis not present

## 2019-09-11 DIAGNOSIS — M109 Gout, unspecified: Secondary | ICD-10-CM | POA: Diagnosis not present

## 2019-09-11 DIAGNOSIS — Z6834 Body mass index (BMI) 34.0-34.9, adult: Secondary | ICD-10-CM | POA: Diagnosis not present

## 2019-09-11 DIAGNOSIS — K219 Gastro-esophageal reflux disease without esophagitis: Secondary | ICD-10-CM | POA: Diagnosis not present

## 2019-09-11 DIAGNOSIS — Z471 Aftercare following joint replacement surgery: Secondary | ICD-10-CM | POA: Diagnosis not present

## 2019-09-11 DIAGNOSIS — E1151 Type 2 diabetes mellitus with diabetic peripheral angiopathy without gangrene: Secondary | ICD-10-CM | POA: Diagnosis not present

## 2019-09-11 DIAGNOSIS — E039 Hypothyroidism, unspecified: Secondary | ICD-10-CM | POA: Diagnosis not present

## 2019-09-11 DIAGNOSIS — G4700 Insomnia, unspecified: Secondary | ICD-10-CM | POA: Diagnosis not present

## 2019-09-11 DIAGNOSIS — Z96641 Presence of right artificial hip joint: Secondary | ICD-10-CM | POA: Diagnosis not present

## 2019-09-11 DIAGNOSIS — M48062 Spinal stenosis, lumbar region with neurogenic claudication: Secondary | ICD-10-CM | POA: Diagnosis not present

## 2019-09-11 DIAGNOSIS — M4726 Other spondylosis with radiculopathy, lumbar region: Secondary | ICD-10-CM | POA: Diagnosis not present

## 2019-09-11 DIAGNOSIS — I2510 Atherosclerotic heart disease of native coronary artery without angina pectoris: Secondary | ICD-10-CM | POA: Diagnosis not present

## 2019-09-11 DIAGNOSIS — F419 Anxiety disorder, unspecified: Secondary | ICD-10-CM | POA: Diagnosis not present

## 2019-09-11 DIAGNOSIS — E785 Hyperlipidemia, unspecified: Secondary | ICD-10-CM | POA: Diagnosis not present

## 2019-09-11 DIAGNOSIS — R32 Unspecified urinary incontinence: Secondary | ICD-10-CM | POA: Diagnosis not present

## 2019-09-11 DIAGNOSIS — Z7901 Long term (current) use of anticoagulants: Secondary | ICD-10-CM | POA: Diagnosis not present

## 2019-09-11 DIAGNOSIS — K5790 Diverticulosis of intestine, part unspecified, without perforation or abscess without bleeding: Secondary | ICD-10-CM | POA: Diagnosis not present

## 2019-09-11 DIAGNOSIS — D509 Iron deficiency anemia, unspecified: Secondary | ICD-10-CM | POA: Diagnosis not present

## 2019-09-11 DIAGNOSIS — I252 Old myocardial infarction: Secondary | ICD-10-CM | POA: Diagnosis not present

## 2019-09-11 DIAGNOSIS — I10 Essential (primary) hypertension: Secondary | ICD-10-CM | POA: Diagnosis not present

## 2019-09-11 DIAGNOSIS — E538 Deficiency of other specified B group vitamins: Secondary | ICD-10-CM | POA: Diagnosis not present

## 2019-09-11 DIAGNOSIS — Z8521 Personal history of malignant neoplasm of larynx: Secondary | ICD-10-CM | POA: Diagnosis not present

## 2019-09-11 DIAGNOSIS — J449 Chronic obstructive pulmonary disease, unspecified: Secondary | ICD-10-CM | POA: Diagnosis not present

## 2019-09-26 DIAGNOSIS — E1159 Type 2 diabetes mellitus with other circulatory complications: Secondary | ICD-10-CM | POA: Diagnosis not present

## 2019-09-26 DIAGNOSIS — E782 Mixed hyperlipidemia: Secondary | ICD-10-CM | POA: Diagnosis not present

## 2019-09-26 DIAGNOSIS — M109 Gout, unspecified: Secondary | ICD-10-CM | POA: Diagnosis not present

## 2019-09-26 DIAGNOSIS — E538 Deficiency of other specified B group vitamins: Secondary | ICD-10-CM | POA: Diagnosis not present

## 2019-09-29 DIAGNOSIS — H6123 Impacted cerumen, bilateral: Secondary | ICD-10-CM | POA: Diagnosis not present

## 2019-09-29 DIAGNOSIS — H93299 Other abnormal auditory perceptions, unspecified ear: Secondary | ICD-10-CM | POA: Diagnosis not present

## 2019-09-29 DIAGNOSIS — Z8521 Personal history of malignant neoplasm of larynx: Secondary | ICD-10-CM | POA: Diagnosis not present

## 2019-10-02 ENCOUNTER — Ambulatory Visit: Payer: PPO | Admitting: Podiatry

## 2019-10-02 ENCOUNTER — Encounter: Payer: Self-pay | Admitting: Podiatry

## 2019-10-02 ENCOUNTER — Other Ambulatory Visit: Payer: Self-pay

## 2019-10-02 DIAGNOSIS — D689 Coagulation defect, unspecified: Secondary | ICD-10-CM

## 2019-10-02 DIAGNOSIS — M79676 Pain in unspecified toe(s): Secondary | ICD-10-CM | POA: Diagnosis not present

## 2019-10-02 DIAGNOSIS — S99922A Unspecified injury of left foot, initial encounter: Secondary | ICD-10-CM | POA: Diagnosis not present

## 2019-10-02 DIAGNOSIS — B351 Tinea unguium: Secondary | ICD-10-CM | POA: Diagnosis not present

## 2019-10-02 DIAGNOSIS — E119 Type 2 diabetes mellitus without complications: Secondary | ICD-10-CM

## 2019-10-02 NOTE — Progress Notes (Signed)
This patient returns to my office for at risk foot care.  This patient requires this care by a professional since this patient will be at risk due to having  pvd and type 2 diabetes.  He presents to the office with his wife.  He also says he has caught the fourth toenail left foot and detached it from nail bed. He says he has soaked his left foot in epsom salts.  This patient is unable to cut nails himself since the patient cannot reach his nails.These nails are painful walking and wearing shoes.  This patient presents for at risk foot care today.  General Appearance  Alert, conversant and in no acute stress.  Vascular  Dorsalis pedis and posterior tibial  pulses are palpable  bilaterally.  Capillary return is within normal limits  bilaterally. Temperature is within normal limits  bilaterally.  Neurologic  Senn-Weinstein monofilament wire test within normal limits  bilaterally. Muscle power within normal limits bilaterally.  Nails Thick disfigured discolored nails with subungual debris  from hallux to fifth toes bilaterally. Fourth toenail is detached from nail bed left foot. No evidence of bacterial infection or drainage bilaterally.  Orthopedic  No limitations of motion  feet .  No crepitus or effusions noted.  No bony pathology or digital deformities noted.  Skin  normotropic skin with no porokeratosis noted bilaterally.  No signs of infections or ulcers noted.     Onychomycosis  Pain in right toes  Pain in left toes.  Injury of fourth toenail left foot.  Consent was obtained for treatment procedures.   Mechanical debridement of nails 1-5  bilaterally performed with a nail nipper.  Filed with dremel without incident. Nail from fourth toe removed.  Neosporin/DSD.  Home soaks through the weekend.   Return office visit   3 months                   Told patient to return for periodic foot care and evaluation due to potential at risk complications.   Gardiner Barefoot DPM

## 2019-10-03 DIAGNOSIS — D692 Other nonthrombocytopenic purpura: Secondary | ICD-10-CM | POA: Diagnosis not present

## 2019-10-03 DIAGNOSIS — D508 Other iron deficiency anemias: Secondary | ICD-10-CM | POA: Diagnosis not present

## 2019-10-03 DIAGNOSIS — J439 Emphysema, unspecified: Secondary | ICD-10-CM | POA: Diagnosis not present

## 2019-10-03 DIAGNOSIS — M5416 Radiculopathy, lumbar region: Secondary | ICD-10-CM | POA: Diagnosis not present

## 2019-10-03 DIAGNOSIS — I25710 Atherosclerosis of autologous vein coronary artery bypass graft(s) with unstable angina pectoris: Secondary | ICD-10-CM | POA: Diagnosis not present

## 2019-10-03 DIAGNOSIS — M1611 Unilateral primary osteoarthritis, right hip: Secondary | ICD-10-CM | POA: Diagnosis not present

## 2019-10-03 DIAGNOSIS — I714 Abdominal aortic aneurysm, without rupture: Secondary | ICD-10-CM | POA: Diagnosis not present

## 2019-10-03 DIAGNOSIS — E119 Type 2 diabetes mellitus without complications: Secondary | ICD-10-CM | POA: Diagnosis not present

## 2019-10-03 DIAGNOSIS — R739 Hyperglycemia, unspecified: Secondary | ICD-10-CM | POA: Diagnosis not present

## 2019-10-03 DIAGNOSIS — G4733 Obstructive sleep apnea (adult) (pediatric): Secondary | ICD-10-CM | POA: Diagnosis not present

## 2019-10-03 DIAGNOSIS — Z96641 Presence of right artificial hip joint: Secondary | ICD-10-CM | POA: Diagnosis not present

## 2019-10-03 DIAGNOSIS — I739 Peripheral vascular disease, unspecified: Secondary | ICD-10-CM | POA: Diagnosis not present

## 2019-10-03 DIAGNOSIS — I10 Essential (primary) hypertension: Secondary | ICD-10-CM | POA: Diagnosis not present

## 2019-10-03 DIAGNOSIS — Z0000 Encounter for general adult medical examination without abnormal findings: Secondary | ICD-10-CM | POA: Diagnosis not present

## 2019-11-03 DIAGNOSIS — E538 Deficiency of other specified B group vitamins: Secondary | ICD-10-CM | POA: Diagnosis not present

## 2019-11-03 DIAGNOSIS — D508 Other iron deficiency anemias: Secondary | ICD-10-CM | POA: Diagnosis not present

## 2019-12-08 DIAGNOSIS — D72829 Elevated white blood cell count, unspecified: Secondary | ICD-10-CM | POA: Diagnosis not present

## 2019-12-08 DIAGNOSIS — D508 Other iron deficiency anemias: Secondary | ICD-10-CM | POA: Diagnosis not present

## 2020-01-05 ENCOUNTER — Other Ambulatory Visit: Payer: Self-pay

## 2020-01-05 ENCOUNTER — Ambulatory Visit: Payer: PPO | Admitting: Podiatry

## 2020-01-05 ENCOUNTER — Encounter: Payer: Self-pay | Admitting: Podiatry

## 2020-01-05 DIAGNOSIS — L989 Disorder of the skin and subcutaneous tissue, unspecified: Secondary | ICD-10-CM | POA: Diagnosis not present

## 2020-01-05 DIAGNOSIS — M79676 Pain in unspecified toe(s): Secondary | ICD-10-CM

## 2020-01-05 DIAGNOSIS — D689 Coagulation defect, unspecified: Secondary | ICD-10-CM

## 2020-01-05 DIAGNOSIS — E119 Type 2 diabetes mellitus without complications: Secondary | ICD-10-CM

## 2020-01-05 DIAGNOSIS — L02611 Cutaneous abscess of right foot: Secondary | ICD-10-CM

## 2020-01-05 DIAGNOSIS — B351 Tinea unguium: Secondary | ICD-10-CM | POA: Diagnosis not present

## 2020-01-05 DIAGNOSIS — L03031 Cellulitis of right toe: Secondary | ICD-10-CM

## 2020-01-05 DIAGNOSIS — L02619 Cutaneous abscess of unspecified foot: Secondary | ICD-10-CM | POA: Insufficient documentation

## 2020-01-05 NOTE — Progress Notes (Addendum)
Complaint:  Visit Type: Patient returns to my office for continued preventative foot care services. Complaint: Patient states" my nails have grown long and thick and become painful to walk and wear shoes"  The patient presents for preventative foot care services. No changes to ROS.  Patient is taking plavix.  Patient says he is having on and off pain in the tip of his right big toe. No history of trauma or drainage.  Podiatric Exam: Vascular: dorsalis pedis and posterior tibial pulses are palpable bilateral. Capillary return is immediate. Temperature gradient is WNL. Skin turgor WNL  Sensorium: Normal Semmes Weinstein monofilament test. Normal tactile sensation bilaterally. Nail Exam: Pt has thick disfigured discolored nails with subungual debris noted bilateral entire nail hallux through fifth toenails Ulcer Exam: There is no evidence of ulcer or pre-ulcerative changes or infection. Orthopedic Exam: Muscle tone and strength are WNL. No limitations in general ROM. No crepitus or effusions noted. Foot type and digits show no abnormalities. Hallux  Malleus  B/L. Skin: No Porokeratosis. No infection or ulcers.  Symptomatic callus distal aspect right hallux. Callus tip of right hallux with fluctuance. No redness or swelling or streaking noted.  No cellulitis noted.  Diagnosis:  Onychomycosis, , Pain in right toe, pain in left toes  Abscess right hallux.  Treatment & Plan Procedures and Treatment: Consent by patient was obtained for treatment procedures.   Debridement of mycotic and hypertrophic toenails, 1 through 5 bilateral and clearing of subungual debris. No ulceration, no infection noted. Debridement of callus tip right hallux with drainage noted.  Using # 15 blade localized drainage/pus noted.   Neosporin/DSD   Home soaks were explained.  Patient to call the office if condition worsens. Return Visit-Office Procedure: Patient instructed to return to the office for a follow up visit 3 months for  continued evaluation and treatment.    Gardiner Barefoot DPM

## 2020-01-08 DIAGNOSIS — G4733 Obstructive sleep apnea (adult) (pediatric): Secondary | ICD-10-CM | POA: Diagnosis not present

## 2020-02-20 ENCOUNTER — Other Ambulatory Visit: Payer: Self-pay | Admitting: Internal Medicine

## 2020-02-20 ENCOUNTER — Ambulatory Visit: Payer: PPO | Attending: Internal Medicine

## 2020-02-20 DIAGNOSIS — M1612 Unilateral primary osteoarthritis, left hip: Secondary | ICD-10-CM | POA: Diagnosis not present

## 2020-02-20 DIAGNOSIS — Z23 Encounter for immunization: Secondary | ICD-10-CM

## 2020-02-20 NOTE — Progress Notes (Signed)
   Covid-19 Vaccination Clinic  Name:  Jesse Allison    MRN: [id number] DOB: [birth year]  02/20/2020  Jesse Allison was observed post Covid-19 immunization for 15 minutes without incident. He was provided with Vaccine Information Sheet and instruction to access the V-Safe system.   Jesse Allison was instructed to call 911 with any severe reactions post vaccine: Marland Kitchen Difficulty breathing  . Swelling of face and throat  . A fast heartbeat  . A bad rash all over body  . Dizziness and weakness

## 2020-03-15 ENCOUNTER — Ambulatory Visit: Payer: PPO | Admitting: Podiatry

## 2020-03-18 ENCOUNTER — Encounter: Payer: Self-pay | Admitting: Podiatry

## 2020-03-18 ENCOUNTER — Ambulatory Visit: Payer: PPO | Admitting: Podiatry

## 2020-03-18 ENCOUNTER — Other Ambulatory Visit: Payer: Self-pay

## 2020-03-18 DIAGNOSIS — B351 Tinea unguium: Secondary | ICD-10-CM

## 2020-03-18 DIAGNOSIS — M79676 Pain in unspecified toe(s): Secondary | ICD-10-CM | POA: Diagnosis not present

## 2020-03-18 DIAGNOSIS — E119 Type 2 diabetes mellitus without complications: Secondary | ICD-10-CM | POA: Diagnosis not present

## 2020-03-18 DIAGNOSIS — D689 Coagulation defect, unspecified: Secondary | ICD-10-CM | POA: Diagnosis not present

## 2020-03-18 NOTE — Progress Notes (Signed)
Complaint:  Visit Type: Patient returns to my office for continued preventative foot care services. Complaint: Patient states" my nails have grown long and thick and become painful to walk and wear shoes"  The patient presents for preventative foot care services. No changes to ROS.  Patient is taking plavix.    Podiatric Exam: Vascular: dorsalis pedis and posterior tibial pulses are palpable bilateral. Capillary return is immediate. Temperature gradient is WNL. Skin turgor WNL  Sensorium: Normal Semmes Weinstein monofilament test. Normal tactile sensation bilaterally. Nail Exam: Pt has thick disfigured discolored nails with subungual debris noted bilateral entire nail hallux through fifth toenails Ulcer Exam: There is no evidence of ulcer or pre-ulcerative changes or infection. Orthopedic Exam: Muscle tone and strength are WNL. No limitations in general ROM. No crepitus or effusions noted. Foot type and digits show no abnormalities. Hallux  Malleus  B/L. Skin: No Porokeratosis. No infection or ulcers.    Diagnosis:  Onychomycosis, , Pain in right toe, pain in left toe   Treatment & Plan Procedures and Treatment: Consent by patient was obtained for treatment procedures.   Debridement of mycotic and hypertrophic toenails, 1 through 5 bilateral and clearing of subungual debris. No ulceration, no infection noted.  Return Visit-Office Procedure: Patient instructed to return to the office for a follow up visit 10 weeks  for continued evaluation and treatment.    Ciani Rutten DPM 

## 2020-03-29 DIAGNOSIS — H6123 Impacted cerumen, bilateral: Secondary | ICD-10-CM | POA: Diagnosis not present

## 2020-03-29 DIAGNOSIS — H93299 Other abnormal auditory perceptions, unspecified ear: Secondary | ICD-10-CM | POA: Diagnosis not present

## 2020-03-29 DIAGNOSIS — Z8521 Personal history of malignant neoplasm of larynx: Secondary | ICD-10-CM | POA: Diagnosis not present

## 2020-03-30 DIAGNOSIS — E119 Type 2 diabetes mellitus without complications: Secondary | ICD-10-CM | POA: Diagnosis not present

## 2020-03-30 DIAGNOSIS — E538 Deficiency of other specified B group vitamins: Secondary | ICD-10-CM | POA: Diagnosis not present

## 2020-03-30 DIAGNOSIS — D508 Other iron deficiency anemias: Secondary | ICD-10-CM | POA: Diagnosis not present

## 2020-03-30 DIAGNOSIS — E782 Mixed hyperlipidemia: Secondary | ICD-10-CM | POA: Diagnosis not present

## 2020-03-30 DIAGNOSIS — M1A00X0 Idiopathic chronic gout, unspecified site, without tophus (tophi): Secondary | ICD-10-CM | POA: Diagnosis not present

## 2020-04-06 DIAGNOSIS — E119 Type 2 diabetes mellitus without complications: Secondary | ICD-10-CM | POA: Diagnosis not present

## 2020-04-06 DIAGNOSIS — R739 Hyperglycemia, unspecified: Secondary | ICD-10-CM | POA: Diagnosis not present

## 2020-04-06 DIAGNOSIS — I25710 Atherosclerosis of autologous vein coronary artery bypass graft(s) with unstable angina pectoris: Secondary | ICD-10-CM | POA: Diagnosis not present

## 2020-04-06 DIAGNOSIS — Z8521 Personal history of malignant neoplasm of larynx: Secondary | ICD-10-CM | POA: Diagnosis not present

## 2020-04-06 DIAGNOSIS — M48062 Spinal stenosis, lumbar region with neurogenic claudication: Secondary | ICD-10-CM | POA: Diagnosis not present

## 2020-04-06 DIAGNOSIS — M5416 Radiculopathy, lumbar region: Secondary | ICD-10-CM | POA: Diagnosis not present

## 2020-04-06 DIAGNOSIS — M5126 Other intervertebral disc displacement, lumbar region: Secondary | ICD-10-CM | POA: Diagnosis not present

## 2020-04-06 DIAGNOSIS — E782 Mixed hyperlipidemia: Secondary | ICD-10-CM | POA: Diagnosis not present

## 2020-04-06 DIAGNOSIS — J439 Emphysema, unspecified: Secondary | ICD-10-CM | POA: Diagnosis not present

## 2020-04-06 DIAGNOSIS — G4733 Obstructive sleep apnea (adult) (pediatric): Secondary | ICD-10-CM | POA: Diagnosis not present

## 2020-04-06 DIAGNOSIS — I739 Peripheral vascular disease, unspecified: Secondary | ICD-10-CM | POA: Diagnosis not present

## 2020-04-06 DIAGNOSIS — I714 Abdominal aortic aneurysm, without rupture: Secondary | ICD-10-CM | POA: Diagnosis not present

## 2020-04-06 DIAGNOSIS — I10 Essential (primary) hypertension: Secondary | ICD-10-CM | POA: Diagnosis not present

## 2020-04-06 DIAGNOSIS — D692 Other nonthrombocytopenic purpura: Secondary | ICD-10-CM | POA: Diagnosis not present

## 2020-04-22 DIAGNOSIS — H02132 Senile ectropion of right lower eyelid: Secondary | ICD-10-CM | POA: Diagnosis not present

## 2020-04-23 DIAGNOSIS — G4733 Obstructive sleep apnea (adult) (pediatric): Secondary | ICD-10-CM | POA: Diagnosis not present

## 2020-05-27 ENCOUNTER — Other Ambulatory Visit: Payer: Self-pay

## 2020-05-27 ENCOUNTER — Encounter: Payer: Self-pay | Admitting: Podiatry

## 2020-05-27 ENCOUNTER — Ambulatory Visit (INDEPENDENT_AMBULATORY_CARE_PROVIDER_SITE_OTHER): Payer: PPO | Admitting: Podiatry

## 2020-05-27 DIAGNOSIS — D689 Coagulation defect, unspecified: Secondary | ICD-10-CM | POA: Diagnosis not present

## 2020-05-27 DIAGNOSIS — B351 Tinea unguium: Secondary | ICD-10-CM

## 2020-05-27 DIAGNOSIS — E119 Type 2 diabetes mellitus without complications: Secondary | ICD-10-CM | POA: Diagnosis not present

## 2020-05-27 DIAGNOSIS — M79676 Pain in unspecified toe(s): Secondary | ICD-10-CM

## 2020-05-27 NOTE — Progress Notes (Signed)
Complaint:  Visit Type: Patient returns to my office for continued preventative foot care services. Complaint: Patient states" my nails have grown long and thick and become painful to walk and wear shoes"  The patient presents for preventative foot care services. No changes to ROS.  Patient is taking plavix.    Podiatric Exam: Vascular: dorsalis pedis and posterior tibial pulses are palpable bilateral. Capillary return is immediate. Temperature gradient is WNL. Skin turgor WNL  Sensorium: Normal Semmes Weinstein monofilament test. Normal tactile sensation bilaterally. Nail Exam: Pt has thick disfigured discolored nails with subungual debris noted bilateral entire nail hallux through fifth toenails Ulcer Exam: There is no evidence of ulcer or pre-ulcerative changes or infection. Orthopedic Exam: Muscle tone and strength are WNL. No limitations in general ROM. No crepitus or effusions noted. Foot type and digits show no abnormalities. Hallux  Malleus  B/L. Skin: No Porokeratosis. No infection or ulcers.    Diagnosis:  Onychomycosis, , Pain in right toe, pain in left toe   Treatment & Plan Procedures and Treatment: Consent by patient was obtained for treatment procedures.   Debridement of mycotic and hypertrophic toenails, 1 through 5 bilateral and clearing of subungual debris. No ulceration, no infection noted.  Return Visit-Office Procedure: Patient instructed to return to the office for a follow up visit 10 weeks  for continued evaluation and treatment.    Gardiner Barefoot DPM

## 2020-06-04 DIAGNOSIS — E782 Mixed hyperlipidemia: Secondary | ICD-10-CM | POA: Diagnosis not present

## 2020-06-04 DIAGNOSIS — D692 Other nonthrombocytopenic purpura: Secondary | ICD-10-CM | POA: Diagnosis not present

## 2020-06-04 DIAGNOSIS — D508 Other iron deficiency anemias: Secondary | ICD-10-CM | POA: Diagnosis not present

## 2020-06-04 DIAGNOSIS — E538 Deficiency of other specified B group vitamins: Secondary | ICD-10-CM | POA: Diagnosis not present

## 2020-06-04 DIAGNOSIS — J439 Emphysema, unspecified: Secondary | ICD-10-CM | POA: Diagnosis not present

## 2020-06-04 DIAGNOSIS — I739 Peripheral vascular disease, unspecified: Secondary | ICD-10-CM | POA: Diagnosis not present

## 2020-06-04 DIAGNOSIS — I25710 Atherosclerosis of autologous vein coronary artery bypass graft(s) with unstable angina pectoris: Secondary | ICD-10-CM | POA: Diagnosis not present

## 2020-06-04 DIAGNOSIS — G4733 Obstructive sleep apnea (adult) (pediatric): Secondary | ICD-10-CM | POA: Diagnosis not present

## 2020-06-04 DIAGNOSIS — R1084 Generalized abdominal pain: Secondary | ICD-10-CM | POA: Diagnosis not present

## 2020-06-04 DIAGNOSIS — R109 Unspecified abdominal pain: Secondary | ICD-10-CM | POA: Diagnosis not present

## 2020-06-04 DIAGNOSIS — I714 Abdominal aortic aneurysm, without rupture: Secondary | ICD-10-CM | POA: Diagnosis not present

## 2020-06-04 DIAGNOSIS — E119 Type 2 diabetes mellitus without complications: Secondary | ICD-10-CM | POA: Diagnosis not present

## 2020-06-07 ENCOUNTER — Emergency Department: Payer: PPO

## 2020-06-07 ENCOUNTER — Inpatient Hospital Stay
Admission: EM | Admit: 2020-06-07 | Discharge: 2020-06-08 | DRG: 389 | Disposition: A | Discharge status: Home / Self Care | Payer: PPO | Attending: Internal Medicine | Admitting: Internal Medicine

## 2020-06-07 ENCOUNTER — Other Ambulatory Visit: Payer: Self-pay

## 2020-06-07 DIAGNOSIS — K5730 Diverticulosis of large intestine without perforation or abscess without bleeding: Secondary | ICD-10-CM | POA: Diagnosis not present

## 2020-06-07 DIAGNOSIS — Z20822 Contact with and (suspected) exposure to covid-19: Secondary | ICD-10-CM | POA: Diagnosis present

## 2020-06-07 DIAGNOSIS — I252 Old myocardial infarction: Secondary | ICD-10-CM

## 2020-06-07 DIAGNOSIS — I2510 Atherosclerotic heart disease of native coronary artery without angina pectoris: Secondary | ICD-10-CM | POA: Diagnosis not present

## 2020-06-07 DIAGNOSIS — E782 Mixed hyperlipidemia: Secondary | ICD-10-CM | POA: Diagnosis not present

## 2020-06-07 DIAGNOSIS — E039 Hypothyroidism, unspecified: Secondary | ICD-10-CM | POA: Diagnosis present

## 2020-06-07 DIAGNOSIS — Z888 Allergy status to other drugs, medicaments and biological substances status: Secondary | ICD-10-CM

## 2020-06-07 DIAGNOSIS — Z961 Presence of intraocular lens: Secondary | ICD-10-CM | POA: Diagnosis not present

## 2020-06-07 DIAGNOSIS — E669 Obesity, unspecified: Secondary | ICD-10-CM | POA: Diagnosis not present

## 2020-06-07 DIAGNOSIS — R1084 Generalized abdominal pain: Secondary | ICD-10-CM | POA: Diagnosis not present

## 2020-06-07 DIAGNOSIS — Z79890 Hormone replacement therapy (postmenopausal): Secondary | ICD-10-CM

## 2020-06-07 DIAGNOSIS — Z9842 Cataract extraction status, left eye: Secondary | ICD-10-CM

## 2020-06-07 DIAGNOSIS — M109 Gout, unspecified: Secondary | ICD-10-CM | POA: Diagnosis not present

## 2020-06-07 DIAGNOSIS — M48061 Spinal stenosis, lumbar region without neurogenic claudication: Secondary | ICD-10-CM | POA: Diagnosis not present

## 2020-06-07 DIAGNOSIS — Z951 Presence of aortocoronary bypass graft: Secondary | ICD-10-CM

## 2020-06-07 DIAGNOSIS — M47816 Spondylosis without myelopathy or radiculopathy, lumbar region: Secondary | ICD-10-CM | POA: Diagnosis not present

## 2020-06-07 DIAGNOSIS — D509 Iron deficiency anemia, unspecified: Secondary | ICD-10-CM | POA: Diagnosis not present

## 2020-06-07 DIAGNOSIS — K56609 Unspecified intestinal obstruction, unspecified as to partial versus complete obstruction: Principal | ICD-10-CM | POA: Diagnosis present

## 2020-06-07 DIAGNOSIS — Z885 Allergy status to narcotic agent status: Secondary | ICD-10-CM

## 2020-06-07 DIAGNOSIS — Z8673 Personal history of transient ischemic attack (TIA), and cerebral infarction without residual deficits: Secondary | ICD-10-CM | POA: Diagnosis not present

## 2020-06-07 DIAGNOSIS — G459 Transient cerebral ischemic attack, unspecified: Secondary | ICD-10-CM | POA: Diagnosis present

## 2020-06-07 DIAGNOSIS — Z96641 Presence of right artificial hip joint: Secondary | ICD-10-CM | POA: Diagnosis present

## 2020-06-07 DIAGNOSIS — Z6830 Body mass index (BMI) 30.0-30.9, adult: Secondary | ICD-10-CM

## 2020-06-07 DIAGNOSIS — Z7901 Long term (current) use of anticoagulants: Secondary | ICD-10-CM

## 2020-06-07 DIAGNOSIS — I714 Abdominal aortic aneurysm, without rupture: Secondary | ICD-10-CM | POA: Diagnosis not present

## 2020-06-07 DIAGNOSIS — I4581 Long QT syndrome: Secondary | ICD-10-CM | POA: Diagnosis not present

## 2020-06-07 DIAGNOSIS — E1151 Type 2 diabetes mellitus with diabetic peripheral angiopathy without gangrene: Secondary | ICD-10-CM | POA: Diagnosis not present

## 2020-06-07 DIAGNOSIS — Z8249 Family history of ischemic heart disease and other diseases of the circulatory system: Secondary | ICD-10-CM

## 2020-06-07 DIAGNOSIS — Z923 Personal history of irradiation: Secondary | ICD-10-CM

## 2020-06-07 DIAGNOSIS — F419 Anxiety disorder, unspecified: Secondary | ICD-10-CM | POA: Diagnosis not present

## 2020-06-07 DIAGNOSIS — I25810 Atherosclerosis of coronary artery bypass graft(s) without angina pectoris: Secondary | ICD-10-CM | POA: Diagnosis present

## 2020-06-07 DIAGNOSIS — G4730 Sleep apnea, unspecified: Secondary | ICD-10-CM | POA: Diagnosis not present

## 2020-06-07 DIAGNOSIS — J449 Chronic obstructive pulmonary disease, unspecified: Secondary | ICD-10-CM | POA: Diagnosis present

## 2020-06-07 DIAGNOSIS — I5022 Chronic systolic (congestive) heart failure: Secondary | ICD-10-CM | POA: Diagnosis not present

## 2020-06-07 DIAGNOSIS — I10 Essential (primary) hypertension: Secondary | ICD-10-CM | POA: Diagnosis not present

## 2020-06-07 DIAGNOSIS — I110 Hypertensive heart disease with heart failure: Secondary | ICD-10-CM | POA: Diagnosis not present

## 2020-06-07 DIAGNOSIS — R109 Unspecified abdominal pain: Secondary | ICD-10-CM | POA: Diagnosis not present

## 2020-06-07 DIAGNOSIS — Z87891 Personal history of nicotine dependence: Secondary | ICD-10-CM

## 2020-06-07 DIAGNOSIS — K56601 Complete intestinal obstruction, unspecified as to cause: Secondary | ICD-10-CM | POA: Diagnosis not present

## 2020-06-07 DIAGNOSIS — K219 Gastro-esophageal reflux disease without esophagitis: Secondary | ICD-10-CM | POA: Diagnosis present

## 2020-06-07 DIAGNOSIS — Z8521 Personal history of malignant neoplasm of larynx: Secondary | ICD-10-CM

## 2020-06-07 DIAGNOSIS — Z79899 Other long term (current) drug therapy: Secondary | ICD-10-CM

## 2020-06-07 DIAGNOSIS — Z7982 Long term (current) use of aspirin: Secondary | ICD-10-CM

## 2020-06-07 DIAGNOSIS — Z9841 Cataract extraction status, right eye: Secondary | ICD-10-CM

## 2020-06-07 LAB — URINALYSIS, COMPLETE (UACMP) WITH MICROSCOPIC
Bacteria, UA: NONE SEEN
Bilirubin Urine: NEGATIVE
Glucose, UA: NEGATIVE mg/dL
Hgb urine dipstick: NEGATIVE
Ketones, ur: NEGATIVE mg/dL
Leukocytes,Ua: NEGATIVE
Nitrite: NEGATIVE
Protein, ur: NEGATIVE mg/dL
Specific Gravity, Urine: 1.023 (ref 1.005–1.030)
Squamous Epithelial / HPF: NONE SEEN (ref 0–5)
pH: 5 (ref 5.0–8.0)

## 2020-06-07 LAB — COMPREHENSIVE METABOLIC PANEL
ALT: 13 U/L (ref 0–44)
AST: 14 U/L — ABNORMAL LOW (ref 15–41)
Albumin: 3.6 g/dL (ref 3.5–5.0)
Alkaline Phosphatase: 58 U/L (ref 38–126)
Anion gap: 11 (ref 5–15)
BUN: 18 mg/dL (ref 8–23)
CO2: 27 mmol/L (ref 22–32)
Calcium: 9 mg/dL (ref 8.9–10.3)
Chloride: 97 mmol/L — ABNORMAL LOW (ref 98–111)
Creatinine, Ser: 1 mg/dL (ref 0.61–1.24)
GFR, Estimated: >60 mL/min (ref >60)
Glucose, Bld: 121 mg/dL — ABNORMAL HIGH (ref 70–99)
Potassium: 4.1 mmol/L (ref 3.5–5.1)
Sodium: 135 mmol/L (ref 135–145)
Total Bilirubin: 1.4 mg/dL — ABNORMAL HIGH (ref 0.3–1.2)
Total Protein: 6.4 g/dL — ABNORMAL LOW (ref 6.5–8.1)

## 2020-06-07 LAB — CBC
HCT: 40.1 % (ref 39.0–52.0)
Hemoglobin: 13 g/dL (ref 13.0–17.0)
MCH: 29.3 pg (ref 26.0–34.0)
MCHC: 32.4 g/dL (ref 30.0–36.0)
MCV: 90.3 fL (ref 80.0–100.0)
Platelets: 218 10*3/uL (ref 150–400)
RBC: 4.44 MIL/uL (ref 4.22–5.81)
RDW: 14.4 % (ref 11.5–15.5)
WBC: 7.3 10*3/uL (ref 4.0–10.5)
nRBC: 0 % (ref 0.0–0.2)

## 2020-06-07 LAB — RESP PANEL BY RT-PCR (FLU A&B, COVID) ARPGX2
Influenza A by PCR: NEGATIVE
Influenza B by PCR: NEGATIVE
SARS Coronavirus 2 by RT PCR: NEGATIVE

## 2020-06-07 LAB — APTT: aPTT: 35 seconds (ref 24–36)

## 2020-06-07 LAB — TYPE AND SCREEN
ABO/RH(D): O POS
Antibody Screen: NEGATIVE

## 2020-06-07 LAB — PROTIME-INR
INR: 1.1 (ref 0.8–1.2)
Prothrombin Time: 13.7 seconds (ref 11.4–15.2)

## 2020-06-07 LAB — BRAIN NATRIURETIC PEPTIDE: B Natriuretic Peptide: 68.7 pg/mL (ref 0.0–100.0)

## 2020-06-07 LAB — LIPASE, BLOOD: Lipase: 26 U/L (ref 11–51)

## 2020-06-07 MED ORDER — FENTANYL CITRATE (PF) 100 MCG/2ML IJ SOLN: 12.5000 ug | INTRAMUSCULAR | Status: DC | PRN

## 2020-06-07 MED ORDER — ALBUTEROL SULFATE HFA 108 (90 BASE) MCG/ACT IN AERS
2.0000 | INHALATION_SPRAY | RESPIRATORY_TRACT | Status: DC | PRN
  Filled 2020-06-07: qty 6.7

## 2020-06-07 MED ORDER — ACETAMINOPHEN 650 MG RE SUPP: 650.0000 mg | Freq: Four times a day (QID) | RECTAL | Status: DC | PRN

## 2020-06-07 MED ORDER — IOHEXOL 300 MG/ML  SOLN
125.0000 mL | Freq: Once | INTRAMUSCULAR | Status: AC | PRN
  Administered 2020-06-07: 125 mL via INTRAVENOUS

## 2020-06-07 MED ORDER — ALLOPURINOL 100 MG PO TABS
300.0000 mg | ORAL_TABLET | Freq: Every day | ORAL | Status: DC
Start: 2020-06-07 — End: 2020-06-08
  Administered 2020-06-07 – 2020-06-08 (×2): 300 mg via ORAL
  Filled 2020-06-07 (×2): qty 3

## 2020-06-07 MED ORDER — TAMSULOSIN HCL 0.4 MG PO CAPS
0.4000 mg | ORAL_CAPSULE | Freq: Every day | ORAL | Status: DC
Start: 2020-06-08 — End: 2020-06-08

## 2020-06-07 MED ORDER — DM-GUAIFENESIN ER 30-600 MG PO TB12: 1.0000 | ORAL_TABLET | Freq: Two times a day (BID) | ORAL | Status: DC | PRN

## 2020-06-07 MED ORDER — ACETAMINOPHEN 325 MG PO TABS: 650.0000 mg | ORAL_TABLET | Freq: Four times a day (QID) | ORAL | Status: DC | PRN

## 2020-06-07 MED ORDER — ONDANSETRON HCL 4 MG/2ML IJ SOLN: 4.0000 mg | Freq: Three times a day (TID) | INTRAMUSCULAR | Status: DC | PRN

## 2020-06-07 MED ORDER — HYDRALAZINE HCL 20 MG/ML IJ SOLN: 5.0000 mg | INTRAMUSCULAR | Status: DC | PRN

## 2020-06-07 MED ORDER — SODIUM CHLORIDE 0.9 % IV SOLN: INTRAVENOUS | Status: DC

## 2020-06-07 MED ORDER — PRAVASTATIN SODIUM 20 MG PO TABS: 20.0000 mg | ORAL_TABLET | Freq: Every day | ORAL | Status: DC

## 2020-06-07 MED ORDER — FUROSEMIDE 40 MG PO TABS
40.0000 mg | ORAL_TABLET | Freq: Every day | ORAL | Status: DC
Start: 2020-06-07 — End: 2020-06-08
  Administered 2020-06-08: 40 mg via ORAL
  Filled 2020-06-07: qty 1

## 2020-06-07 MED ORDER — IOHEXOL 9 MG/ML PO SOLN
1000.0000 mL | ORAL | Status: DC | PRN
  Administered 2020-06-07: 1000 mL via ORAL

## 2020-06-07 MED ORDER — LOSARTAN POTASSIUM 50 MG PO TABS
50.0000 mg | ORAL_TABLET | Freq: Every day | ORAL | Status: DC
Start: 2020-06-07 — End: 2020-06-08
  Administered 2020-06-07 – 2020-06-08 (×2): 50 mg via ORAL
  Filled 2020-06-07 (×3): qty 1

## 2020-06-07 MED ORDER — FERROUS SULFATE 325 (65 FE) MG PO TABS
325.0000 mg | ORAL_TABLET | Freq: Every day | ORAL | Status: DC
  Filled 2020-06-07: qty 1

## 2020-06-07 MED ORDER — CARVEDILOL 12.5 MG PO TABS
12.5000 mg | ORAL_TABLET | Freq: Two times a day (BID) | ORAL | Status: DC
  Administered 2020-06-07 – 2020-06-08 (×2): 12.5 mg via ORAL
  Filled 2020-06-07 (×2): qty 1

## 2020-06-07 MED ORDER — VITAMIN B-12 1000 MCG PO TABS
1000.0000 ug | ORAL_TABLET | Freq: Every day | ORAL | Status: DC
  Administered 2020-06-08: 1000 ug via ORAL
  Filled 2020-06-07: qty 1

## 2020-06-07 MED ORDER — IOHEXOL 9 MG/ML PO SOLN: 1000.0000 mL | ORAL | Status: DC

## 2020-06-07 MED ORDER — NITROGLYCERIN 0.4 MG SL SUBL: 0.4000 mg | SUBLINGUAL_TABLET | SUBLINGUAL | Status: DC | PRN

## 2020-06-07 MED ORDER — CALCIUM CARBONATE ANTACID 500 MG PO CHEW
600.0000 mg | CHEWABLE_TABLET | Freq: Every day | ORAL | Status: DC
  Administered 2020-06-07: 600 mg via ORAL
  Filled 2020-06-07: qty 3

## 2020-06-07 MED ORDER — LEVOTHYROXINE SODIUM 50 MCG PO TABS
50.0000 ug | ORAL_TABLET | Freq: Every day | ORAL | Status: DC
  Administered 2020-06-08: 50 ug via ORAL
  Filled 2020-06-07: qty 1

## 2020-06-07 MED ORDER — ASPIRIN EC 81 MG PO TBEC
81.0000 mg | DELAYED_RELEASE_TABLET | Freq: Every day | ORAL | Status: DC
  Administered 2020-06-08: 81 mg via ORAL
  Filled 2020-06-07: qty 1

## 2020-06-07 MED ORDER — PANTOPRAZOLE SODIUM 40 MG PO TBEC
40.0000 mg | DELAYED_RELEASE_TABLET | Freq: Every day | ORAL | Status: DC
Start: 2020-06-07 — End: 2020-06-08
  Administered 2020-06-07 – 2020-06-08 (×2): 40 mg via ORAL
  Filled 2020-06-07 (×2): qty 1

## 2020-06-07 NOTE — H&P (Signed)
History and Physical    Jesse Allison:[id number] DOB: [birth year] DOA: 06/07/2020  Referring MD/NP/PA:   PCP: Adin Hector, MD   Patient coming from:  The patient is coming from home.  At baseline, pt is independent for most of ADL.        Chief Complaint: abdominal pain  HPI: Jesse Allison is a 80 y.o. male with medical history significant of sCHF with EF 35-40%, CAD with CABG, laryngeal cancer (s/p of radiation and surgery), anemia, AAA repair, PVD, HTN, HLD, DM, TIA, hypothyroidism, gout, depression, who present with abdominal pain.  Patient has abdominal pain in the past 10 days.  Patient has intermittent diarrhea and constipation.  Abdominal pain is diffuse, constant, cramping, moderate to severe, nonradiating.  Patient does not have nausea, vomiting no fever or chills.  Denies chest pain, cough, shortness breath.  No symptoms of UTI or unilateral weakness. Patient was sent in [facility] on Friday and had image done. He was called and told that he has bowel obstruction today.  ED Course: pt was found to have WBC 7.3, lipase 26, electrolytes renal function okay, temperature normal, blood pressure 161/73, heart rate 72, RR 18, oxygen saturation 96% on room air.  CT scan of abdomen/pelvis showed distal small bowel obstruction. Pt is admitted to [facility] bed as inpatient.  Dr. Launa Flight of Surgery is consulted.  CT abdomen/pelvis:  Distal small bowel obstruction. Transition point appears to be at an area of wall thickening (series 5, image 28).   Small volume abdominopelvic free fluid.  Extensive sigmoid diverticulosis.  Aorta bi-iliac stent.  Lumbar spine degenerative changes superimposed on congenital canal narrowing. Likely severe stenosis at L3-L4.   Review of Systems:   General: no fevers, chills, no body weight gain, has fatigue HEENT: no blurry vision, hearing changes or sore throat Respiratory: no dyspnea, coughing, wheezing CV: no chest pain, no  palpitations GI: no nausea, vomiting, has abdominal pain, diarrhea, constipation GU: no dysuria, burning on urination, increased urinary frequency, hematuria  Ext: has leg edema Neuro: no unilateral weakness, numbness, or tingling, no vision change or hearing loss Skin: no rash, no skin tear. MSK: No muscle spasm, no deformity, no limitation of range of movement in spin Heme: No easy bruising.  Travel history: No recent long distant travel.  Allergy:  Allergies  Allergen Reactions  . Niacin Other (See Comments)    Leg cramps Leg cramps  Leg cramps  . Oxycodone Other (See Comments)    Makes him loopy and out of control.  Does not like the sensation. Makes him loopy and out of control.  Does not like the sensation.  . Prednisone Other (See Comments)    Severe pain D/T stopping prednisone immediately rather than weaning it. Severe pain D/T stopping prednisone immediately rather than weaning it.    Past Medical History:  Diagnosis Date  . Anemia    iron deficiency  . Anxiety   . Arthritis   . Cancer ([place]) 2015   throat. had surgery and radiation  . Complication of anesthesia    woke up during AAA repair. Had an epidural and does NOT want again.  . Coronary artery disease   . Dyspnea    chronic cough since teenage years.  Marland Kitchen GERD (gastroesophageal reflux disease)   . Gout   . Hypercholesterolemia   . Hypertension   . Hypothyroidism   . Myocardial infarction ([place]) 1993  . Obesity (BMI 30.0-34.9) 08/11/2019  . PONV (postoperative nausea  and vomiting)    Several times, vomitting for hours after surgery.  . Sleep apnea   . TIA (transient ischemic attack)     Past Surgical History:  Procedure Laterality Date  . ABDOMINAL AORTIC ANEURYSM REPAIR  2010   no longer followed on a regular basis  . BACK SURGERY  2000   L4 . piece of vertebrae broke off and lodged into spine. had to remove the loose piece.  Marland Kitchen CARDIAC CATHETERIZATION    . CATARACT EXTRACTION W/PHACO Left  12/12/2016   Procedure: CATARACT EXTRACTION PHACO AND INTRAOCULAR LENS PLACEMENT (IOC);  Surgeon: Birder Robson, MD;  Location: [facility] ORS;  Service: Ophthalmology;  Laterality: Left;  [place] 00:38.1 AP% 14.1 CDE 5.38 FLUID PACK LOT # [id number]  . CATARACT EXTRACTION W/PHACO Right 12/26/2016   Procedure: CATARACT EXTRACTION PHACO AND INTRAOCULAR LENS PLACEMENT (IOC);  Surgeon: Birder Robson, MD;  Location: [facility] ORS;  Service: Ophthalmology;  Laterality: Right;  [place] 00:53.7 AP% 12.9 CDE 6.92 Fluid Pack Lot # [id number]  . CORONARY ARTERY BYPASS GRAFT  1993  . EYE SURGERY Bilateral   . LARYNGOSCOPY  2015   tumor removal followed by radiation treatments.  . TESTICLAR CYST EXCISION    . TOTAL HIP ARTHROPLASTY Right 08/21/2019   Procedure: TOTAL HIP ARTHROPLASTY ANTERIOR APPROACH;  Surgeon: Hessie Knows, MD;  Location: [facility] ORS;  Service: Orthopedics;  Laterality: Right;    Social History:  reports that he quit smoking about 12 years ago. His smoking use included cigarettes. He has never used smokeless tobacco. He reports current alcohol use. He reports that he does not use drugs.  Family History:  Family History  Problem Relation Age of Onset  . Leukemia Mother   . Heart attack Father   . Stroke Sister      Prior to Admission medications   Medication Sig Start Date End Date Taking? Authorizing Provider  acetaminophen-codeine (TYLENOL #3) 300-30 MG tablet Take 1 tablet by mouth every 6 (six) hours as needed. 08/12/19   [provider]  albuterol (VENTOLIN HFA) 108 (90 Base) MCG/ACT inhaler Inhale 1-2 puffs into the lungs every 6 (six) hours as needed for wheezing or shortness of breath.  05/27/18   [provider]  allopurinol (ZYLOPRIM) 300 MG tablet Take 1 tablet by mouth daily. 11/04/18   [provider]  aspirin EC 81 MG tablet Take 1 tablet (81 mg total) by mouth in the morning and at bedtime. 08/22/19   Duanne Guess, PA-C  azelastine (ASTELIN) 0.1 % nasal  spray Place 1-2 sprays into both nostrils 2 (two) times daily as needed for allergies.  04/13/18   [provider]  busPIRone (BUSPAR) 10 MG tablet Take by mouth. 10/01/18   [provider]  calcium carbonate (OSCAL) 1500 (600 Ca) MG TABS tablet Take 600 mg of elemental calcium by mouth at bedtime.     [provider]  carvedilol (COREG) 25 MG tablet Take 0.5 tablets by mouth 2 (two) times daily. 10/15/19   [provider]  clopidogrel (PLAVIX) 75 MG tablet Take 1 tablet by mouth daily. 01/23/19   [provider]  colchicine 0.6 MG tablet 1 p.o. daily x7 days, then daily as needed for gout flares 08/22/19   [provider]  cyanocobalamin 1000 MCG tablet Take 1,000 mcg by mouth daily after lunch.     [provider]  furosemide (LASIX) 40 MG tablet Take 1 tablet by mouth daily. 07/17/19   [provider]  HYDROcodone-acetaminophen (NORCO/VICODIN)  5-325 MG tablet Take by mouth. 09/10/19   [provider]  Iron, Ferrous Sulfate, 142 (45 Fe) MG TBCR Take 45 mg by mouth daily with lunch.     [provider]  levothyroxine (SYNTHROID, LEVOTHROID) 50 MCG tablet Take 50 mcg by mouth daily before breakfast.    [provider]  losartan (COZAAR) 50 MG tablet Take 1 tablet by mouth daily. 04/18/19   [provider]  lovastatin (MEVACOR) 20 MG tablet Take 20 mg by mouth daily at 6 PM.    [provider]  methocarbamol (ROBAXIN) 500 MG tablet Take by mouth. 08/24/19   [provider]  mirabegron ER (MYRBETRIQ) 25 MG TB24 tablet Take by mouth. 10/03/19   [provider]  nitroGLYCERIN (NITROSTAT) 0.4 MG SL tablet Place under the tongue. 06/24/19   [provider]  nystatin cream (MYCOSTATIN) Apply topically. 09/09/19   [provider]  pantoprazole (PROTONIX) 40 MG tablet Take 1 tablet by mouth daily. 07/17/19   [provider]  potassium chloride (KLOR-CON) 10 MEQ  tablet Take 1 tablet by mouth daily. 01/23/19   [provider]  traMADol (ULTRAM) 50 MG tablet Take 50 mg by mouth 2 (two) times daily as needed. 07/01/19   [provider]    Physical Exam: Vitals:   06/07/20 1400 06/07/20 1515 06/07/20 1700 06/07/20 1745  BP: 139/71 135/64 (!) 165/66 (!) 156/71  Pulse: 76 75 71 73  Resp: 18 18 18    Temp: 98.1 F (36.7 C) 98.1 F (36.7 C) 98.1 F (36.7 C) 97.6 F (36.4 C)  TempSrc: Oral Oral Oral Oral  SpO2: 96% 99% 98% 98%  Weight:      Height:       General: Not in acute distress HEENT:       Eyes: PERRL, EOMI, no scleral icterus.       ENT: No discharge from the ears and nose, no pharynx injection, no tonsillar enlargement.        Neck: No JVD, no bruit, no mass felt. Heme: No neck lymph node enlargement. Cardiac: S1/S2, RRR, No murmurs, No gallops or rubs. Respiratory: No rales, wheezing, rhonchi or rubs. GI: distended, has diffuse tenderness, no rebound pain, no organomegaly, BS present. GU: No hematuria Ext: 1+ pitting leg edema bilaterally. 1+DP/PT pulse bilaterally. Musculoskeletal: No joint deformities, No joint redness or warmth, no limitation of ROM in spin. Skin: No rashes.  Neuro: Alert, oriented X3, cranial nerves II-XII grossly intact, moves all extremities normally.  Psych: Patient is not psychotic, no suicidal or hemocidal ideation.  Labs on Admission: I have personally reviewed following labs and imaging studies  CBC: Recent Labs  Lab 06/07/20 0922  WBC 7.3  HGB 13.0  HCT 40.1  MCV 90.3  PLT 749   Basic Metabolic Panel: Recent Labs  Lab 06/07/20 0922  NA 135  K 4.1  CL 97*  CO2 27  GLUCOSE 121*  BUN 18  CREATININE 1.00  CALCIUM 9.0   GFR: Estimated Creatinine Clearance: 75.4 mL/min (by C-G formula based on SCr of 1 mg/dL). Liver Function Tests: Recent Labs  Lab 06/07/20 0922  AST 14*  ALT 13  ALKPHOS 58  BILITOT 1.4*  PROT 6.4*  ALBUMIN 3.6   Recent Labs  Lab  06/07/20 0922  LIPASE 26   No results for input(s): AMMONIA in the last 168 hours. Coagulation Profile: Recent Labs  Lab 06/07/20 1401  INR 1.1   Cardiac Enzymes: No results for input(s): CKTOTAL, CKMB,  CKMBINDEX, TROPONINI in the last 168 hours. BNP (last 3 results) No results for input(s): PROBNP in the last 8760 hours. HbA1C: No results for input(s): HGBA1C in the last 72 hours. CBG: No results for input(s): GLUCAP in the last 168 hours. Lipid Profile: No results for input(s): CHOL, HDL, LDLCALC, TRIG, CHOLHDL, LDLDIRECT in the last 72 hours. Thyroid Function Tests: No results for input(s): TSH, T4TOTAL, FREET4, T3FREE, THYROIDAB in the last 72 hours. Anemia Panel: No results for input(s): VITAMINB12, FOLATE, FERRITIN, TIBC, IRON, RETICCTPCT in the last 72 hours. Urine analysis:    Component Value Date/Time   COLORURINE AMBER (A) 06/07/2020 0922   APPEARANCEUR CLEAR (A) 06/07/2020 0922   LABSPEC 1.023 06/07/2020 0922   PHURINE 5.0 06/07/2020 0922   GLUCOSEU NEGATIVE 06/07/2020 0922   HGBUR NEGATIVE 06/07/2020 0922   BILIRUBINUR NEGATIVE 06/07/2020 0922   KETONESUR NEGATIVE 06/07/2020 0922   PROTEINUR NEGATIVE 06/07/2020 0922   NITRITE NEGATIVE 06/07/2020 0922   LEUKOCYTESUR NEGATIVE 06/07/2020 0922   Sepsis Labs: @LABRCNTIP (procalcitonin:4,lacticidven:4) ) Recent Results (from the past 240 hour(s))  Resp Panel by RT-PCR (Flu A&B, Covid) Nasopharyngeal Swab     Status: None   Collection Time: 06/07/20  2:01 PM   Specimen: Nasopharyngeal Swab; Nasopharyngeal(NP) swabs in vial transport medium  Result Value Ref Range Status   SARS Coronavirus 2 by RT PCR NEGATIVE NEGATIVE Final    Comment: (NOTE) SARS-CoV-2 target nucleic acids are NOT DETECTED.  The SARS-CoV-2 RNA is generally detectable in upper respiratory specimens during the acute phase of infection. The lowest concentration of SARS-CoV-2 viral copies this assay can detect is 138 copies/mL. A negative  result does not preclude SARS-Cov-2 infection and should not be used as the sole basis for treatment or other patient management decisions. A negative result may occur with  improper specimen collection/handling, submission of specimen other than nasopharyngeal swab, presence of viral mutation(s) within the areas targeted by this assay, and inadequate number of viral copies(<138 copies/mL). A negative result must be combined with clinical observations, patient history, and epidemiological information. The expected result is Negative.  Fact Sheet for Patients:  EntrepreneurPulse.com.au  Fact Sheet for Healthcare Providers:  IncredibleEmployment.be  This test is no t yet approved or cleared by the [place] FDA and  has been authorized for detection and/or diagnosis of SARS-CoV-2 by FDA under an Emergency Use Authorization (EUA). This EUA will remain  in effect (meaning this test can be used) for the duration of the COVID-19 declaration under Section 564(b)(1) of the Act, 21 U.S.C.section 360bbb-3(b)(1), unless the authorization is terminated  or revoked sooner.       Influenza A by PCR NEGATIVE NEGATIVE Final   Influenza B by PCR NEGATIVE NEGATIVE Final    Comment: (NOTE) The Xpert Xpress SARS-CoV-2/FLU/RSV plus assay is intended as an aid in the diagnosis of influenza from Nasopharyngeal swab specimens and should not be used as a sole basis for treatment. Nasal washings and aspirates are unacceptable for Xpert Xpress SARS-CoV-2/FLU/RSV testing.  Fact Sheet for Patients: EntrepreneurPulse.com.au  Fact Sheet for Healthcare Providers: IncredibleEmployment.be  This test is not yet approved or cleared by the [place] FDA and has been authorized for detection and/or diagnosis of SARS-CoV-2 by FDA under an Emergency Use Authorization (EUA). This EUA will remain in effect (meaning this test can be used)  for the duration of the COVID-19 declaration under Section 564(b)(1) of the Act, 21 U.S.C. section 360bbb-3(b)(1), unless the authorization is terminated or revoked.  Performed at [facility]  Lab, [place], [place] [address]      Radiological Exams on Admission: CT Abdomen Pelvis W Contrast  Result Date: 06/07/2020 CLINICAL DATA:  Abdominal pain with episodes of diarrhea and constipation, had imaging imaging bowel obstruction suspected EXAM: CT ABDOMEN AND PELVIS WITH CONTRAST TECHNIQUE: Multidetector CT imaging of the abdomen and pelvis was performed using the standard protocol following bolus administration of intravenous contrast. CONTRAST:  172mL OMNIPAQUE IOHEXOL 300 MG/ML  SOLN COMPARISON:  Outside recent radiograph is unavailable FINDINGS: Lower chest: Bibasilar atelectasis/scarring. Hepatobiliary: No focal liver abnormality is seen. No gallstones, gallbladder wall thickening, or biliary dilatation. Pancreas: Unremarkable. Spleen: Unremarkable. Adrenals/Urinary Tract: Adrenals are unremarkable. Bilateral renal cysts and too small to characterize low-attenuation lesions. Bladder is partially obscured by artifact but appears unremarkable. Stomach/Bowel: Stomach is within normal limits. There are distended loops of small bowel with air-fluid levels there are some relatively decompressed loops of distal small bowel. Transition point appears to be at an area of wall thickening as seen on series 5, image 28. Extensive sigmoid diverticulosis. Vascular/Lymphatic: Diffuse atherosclerosis. Abdominal aortic aneurysm post aorta bi-iliac stenting. No enlarged lymph nodes identified. Reproductive: Unremarkable. Other: Small volume free fluid in the pelvis, adjacent to bowel loops, and adjacent to the liver. No acute abnormality of the abdominal wall. Musculoskeletal: Right hip arthroplasty with associated streak artifact. Lumbar spine degenerative changes. Multilevel canal stenosis.  Likely severe stenosis L3-L4. No acute osseous abnormality. IMPRESSION: Distal small bowel obstruction. Transition point appears to be at an area of wall thickening (series 5, image 28). Small volume abdominopelvic free fluid. Extensive sigmoid diverticulosis. Aorta bi-iliac stent. Lumbar spine degenerative changes superimposed on congenital canal narrowing. Likely severe stenosis at L3-L4. Electronically Signed   By: Macy Mis M.D.   On: 06/07/2020 12:29     EKG: Not done in ED, will get one.   Assessment/Plan Principal Problem:   SBO (small bowel obstruction) (HCC) Active Problems:   Anemia, iron deficiency   CAD (coronary artery disease), autologous vein bypass graft   COPD (chronic obstructive pulmonary disease) (HCC)   Mixed hyperlipidemia   Chronic systolic CHF (congestive heart failure) (HCC)   HTN (hypertension)   TIA (transient ischemic attack)   Hypothyroidism   SBO (small bowel obstruction) ([place]): Currently no nausea vomiting.  General surgeon, Dr. Lysle Pearl is consulted. -Admitted to MedSurg bed as inpatient -Hold off NG tube now since patient does not have nausea vomiting -prn fentanyl for pain -As needed Zofran for nausea -Gentle IV fluid  Anemia, iron deficiency: Hemoglobin stable, 13.0 -Continue iron supplement  CAD (coronary artery disease), autologous vein bypass graft -Aspirin, pravastatin  COPD (chronic obstructive pulmonary disease) (HCC) -Bronchodilators  Mixed hyperlipidemia -Pravastatin  Chronic systolic CHF (congestive heart failure) ([place]): 2D echo on 07/28/2019 showed EF 35-40%.  Patient has leg edema, but no respiratory distress.  Does not seem to have CHF exacerbation -Hold Lasix tonight, start tomorrow  HTN (hypertension) -IV hydralazine as needed -Cozaar, Coreg  TIA (transient ischemic attack) -Pravastatin -Hold Plavix in case patient needs surgery  Hypothyroidism -Synthroid     DVT ppx: SCD Code Status: Full code Family  Communication:   Yes, patient's daughter at bed side Disposition Plan:  Anticipate discharge back to previous environment Consults called:  Dr. Lysle Pearl of surgery Admission status and Level of care: Med-Surg:   as inpt       Status is: Inpatient  Remains inpatient appropriate because:Inpatient level of care appropriate due to severity of illness   Dispo: The  patient is from: Home              Anticipated d/c is to: Home              Patient currently is not medically stable to d/c.   Difficult to place patient No           Date of Service 06/07/2020    [facility]   If 7PM-7AM, please contact night-coverage [email or web address] 06/07/2020, 6:25 PM

## 2020-06-07 NOTE — ED Notes (Signed)
Pt ambulated to commode, states he had a small formed BM.

## 2020-06-07 NOTE — ED Provider Notes (Signed)
[facility] Emergency Department Provider Note   ____________________________________________   Event Date/Time   First MD Initiated Contact with Patient 06/07/20 1026     (approximate)  I have reviewed the triage vital signs and the nursing notes.   HISTORY  Chief Complaint Abdominal Pain    HPI Jesse Allison is a 80 y.o. male with a past medical history of anemia, GERD, hypertension, hypothyroidism, obesity, and endovascular AAA repair who presents after being called and told that he had a small bowel obstruction that was appreciated imaging done 3 days prior to arrival.  Patient states that he has been having some dull, nonradiating, abdominal cramping over the last 10 days.  Patient states that this pain is partially worsened with p.o. intake and has no relieving factors.  Patient states last bowel movement was yesterday at 2 PM with solid stool without any dark tarry or bright red blood.  Patient denies any nausea/vomiting.  Patient denies any symptoms similar to this in the past.  Patient currently denies any vision changes, tinnitus, difficulty speaking, facial droop, sore throat, chest pain, shortness of breath, nausea/vomiting/diarrhea, dysuria, or weakness/numbness/paresthesias in any extremity         Past Medical History:  Diagnosis Date  . Anemia    iron deficiency  . Anxiety   . Arthritis   . Cancer ([place]) 2015   throat. had surgery and radiation  . Complication of anesthesia    woke up during AAA repair. Had an epidural and does NOT want again.  . Coronary artery disease   . Dyspnea    chronic cough since teenage years.  Marland Kitchen GERD (gastroesophageal reflux disease)   . Gout   . Hypercholesterolemia   . Hypertension   . Hypothyroidism   . Myocardial infarction ([place]) 1993  . Obesity (BMI 30.0-34.9) 08/11/2019  . PONV (postoperative nausea and vomiting)    Several times, vomitting for hours after surgery.  . Sleep apnea   . TIA  (transient ischemic attack)     Patient Active Problem List   Diagnosis Date Noted  . SBO (small bowel obstruction) ([place]) 06/07/2020  . Chronic systolic CHF (congestive heart failure) ([place]) 06/07/2020  . HTN (hypertension) 06/07/2020  . TIA (transient ischemic attack) 06/07/2020  . Hypothyroidism 06/07/2020  . Cellulitis and abscess of toe 01/05/2020  . Injury of toenail, left, initial encounter 10/02/2019  . Status post total hip replacement, right 08/21/2019  . Coagulation disorder ([place]) 11/14/2018  . Anxiety 08/15/2018  . Benign essential hypertension 08/15/2018  . CAD (coronary artery disease), autologous vein bypass graft 08/15/2018  . COPD (chronic obstructive pulmonary disease) ([place]) 08/15/2018  . Diverticulosis 08/15/2018  . Edema 08/15/2018  . GERD (gastroesophageal reflux disease) 08/15/2018  . Gout, joint 08/15/2018  . Mixed hyperlipidemia 08/15/2018  . PVD (peripheral vascular disease) ([place]) 08/15/2018  . Sleep apnea 08/15/2018  . Type 2 diabetes mellitus without complication ([place]) 37/16/9678  . Vitamin B12 deficiency 08/15/2018  . Onychomycosis of multiple toenails with type 2 diabetes mellitus ([place]) 09/28/2017  . Morbid obesity ([place]) 08/08/2016  . Senile purpura ([place]) 02/08/2016  . Abdominal aortic aneurysm (AAA) without rupture ([place]) 02/07/2016  . Aortic aneurysm ([place]) 11/04/2015  . H/O laryngeal cancer 08/05/2015  . Anemia, iron deficiency 03/07/2015  . Lumbar radiculitis 08/25/2013  . Lumbar spondylosis 08/25/2013  . Lumbar stenosis with neurogenic claudication 08/25/2013    Past Surgical History:  Procedure Laterality Date  . ABDOMINAL AORTIC ANEURYSM REPAIR  2010   no  longer followed on a regular basis  . BACK SURGERY  2000   L4 . piece of vertebrae broke off and lodged into spine. had to remove the loose piece.  Marland Kitchen CARDIAC CATHETERIZATION    . CATARACT EXTRACTION W/PHACO Left 12/12/2016   Procedure: CATARACT EXTRACTION PHACO AND INTRAOCULAR LENS  PLACEMENT (IOC);  Surgeon: Birder Robson, MD;  Location: [facility] ORS;  Service: Ophthalmology;  Laterality: Left;  [place] 00:38.1 AP% 14.1 CDE 5.38 FLUID PACK LOT # [id number]  . CATARACT EXTRACTION W/PHACO Right 12/26/2016   Procedure: CATARACT EXTRACTION PHACO AND INTRAOCULAR LENS PLACEMENT (IOC);  Surgeon: Birder Robson, MD;  Location: [facility] ORS;  Service: Ophthalmology;  Laterality: Right;  [place] 00:53.7 AP% 12.9 CDE 6.92 Fluid Pack Lot # [id number]  . CORONARY ARTERY BYPASS GRAFT  1993  . EYE SURGERY Bilateral   . LARYNGOSCOPY  2015   tumor removal followed by radiation treatments.  . TESTICLAR CYST EXCISION    . TOTAL HIP ARTHROPLASTY Right 08/21/2019   Procedure: TOTAL HIP ARTHROPLASTY ANTERIOR APPROACH;  Surgeon: Hessie Knows, MD;  Location: [facility] ORS;  Service: Orthopedics;  Laterality: Right;    Prior to Admission medications   Medication Sig Start Date End Date Taking? Authorizing Provider  allopurinol (ZYLOPRIM) 300 MG tablet Take 300 mg by mouth daily. 11/04/18  Yes [provider]  aspirin EC 81 MG tablet Take 1 tablet (81 mg total) by mouth in the morning and at bedtime. 08/22/19  Yes Duanne Guess, PA-C  calcium carbonate (OSCAL) 1500 (600 Ca) MG TABS tablet Take 600 mg of elemental calcium by mouth at bedtime.    Yes [provider]  carvedilol (COREG) 25 MG tablet Take 0.5 tablets by mouth 2 (two) times daily. 10/15/19  Yes [provider]  clopidogrel (PLAVIX) 75 MG tablet Take 75 mg by mouth daily. 01/23/19  Yes [provider]  cyanocobalamin 1000 MCG tablet Take 1,000 mcg by mouth daily after lunch.    Yes [provider]  furosemide (LASIX) 40 MG tablet Take 40 mg by mouth daily. 07/17/19  Yes [provider]  Iron, Ferrous Sulfate, 142 (45 Fe) MG TBCR Take 45 mg by mouth daily with lunch.    Yes [provider]  levothyroxine (SYNTHROID, LEVOTHROID) 50 MCG tablet Take 50 mcg by mouth daily before breakfast.   Yes  [provider]  losartan (COZAAR) 50 MG tablet Take 50 mg by mouth daily. 04/18/19  Yes [provider]  lovastatin (MEVACOR) 20 MG tablet Take 20 mg by mouth daily at 6 PM.   Yes [provider]  pantoprazole (PROTONIX) 40 MG tablet Take 40 mg by mouth daily. 07/17/19  Yes [provider]  potassium chloride (KLOR-CON) 10 MEQ tablet Take 10 mEq by mouth daily. 01/23/19  Yes [provider]  tamsulosin (FLOMAX) 0.4 MG CAPS capsule Take 0.4 mg by mouth daily. Take after 30 minutes after same meal each day 04/17/20  Yes [provider]  albuterol (VENTOLIN HFA) 108 (90 Base) MCG/ACT inhaler Inhale 1-2 puffs into the lungs every 6 (six) hours as needed for wheezing or shortness of breath.  05/27/18   [provider]  azelastine (ASTELIN) 0.1 % nasal spray Place 1-2 sprays into both nostrils 2 (two) times daily as needed for allergies.  04/13/18   [provider]  colchicine 0.6 MG tablet Take 0.6 mg by mouth daily. 08/22/19   [provider]  nitroGLYCERIN (NITROSTAT) 0.4 MG SL tablet Place under the tongue. 06/24/19  [provider]    Allergies Niacin, Oxycodone, and Prednisone  No family history on file.  Social History Social History   Tobacco Use  . Smoking status: Former Smoker    Types: Cigarettes    Quit date: 2010    Years since quitting: 12.1  . Smokeless tobacco: Never Used  Vaping Use  . Vaping Use: Never used  Substance Use Topics  . Alcohol use: Yes    Comment: couple of glasses of wine per day.  . Drug use: No    Review of Systems Constitutional: No fever/chills Eyes: No visual changes. ENT: No sore throat. Cardiovascular: Denies chest pain. Respiratory: Denies shortness of breath. Gastrointestinal: Endorses abdominal pain and distention.  No nausea, no vomiting.  No diarrhea. Genitourinary: Negative for dysuria. Musculoskeletal: Negative for acute arthralgias Skin: Negative for  rash. Neurological: Negative for headaches, weakness/numbness/paresthesias in any extremity Psychiatric: Negative for suicidal ideation/homicidal ideation   ____________________________________________   PHYSICAL EXAM:  VITAL SIGNS: ED Triage Vitals  Enc Vitals Group     BP 06/07/20 0918 (!) 143/77     Pulse Rate 06/07/20 0918 72     Resp 06/07/20 0918 17     Temp 06/07/20 0920 98.1 F (36.7 C)     Temp Source 06/07/20 0920 Oral     SpO2 06/07/20 0918 99 %     Weight 06/07/20 0920 250 lb (113.4 kg)     Height 06/07/20 0920 5\' 11"  (1.803 m)     Head Circumference --      Peak Flow --      Pain Score 06/07/20 0920 5     Pain Loc --      Pain Edu? --      Excl. in [place]? --    Constitutional: Alert and oriented. Well appearing and in no acute distress. Eyes: Conjunctivae are normal. PERRL. Head: Atraumatic. Nose: No congestion/rhinnorhea. Mouth/Throat: Mucous membranes are moist. Neck: No stridor Cardiovascular: Grossly normal heart sounds.  Good peripheral circulation. Respiratory: Normal respiratory effort.  No retractions. Gastrointestinal: Mild distention with generalized abdominal tenderness to palpation Musculoskeletal: No obvious deformities Neurologic:  Normal speech and language. No gross focal neurologic deficits are appreciated. Skin:  Skin is warm and dry. No rash noted. Psychiatric: Mood and affect are normal. Speech and behavior are normal.  ____________________________________________   LABS (all labs ordered are listed, but only abnormal results are displayed)  Labs Reviewed  COMPREHENSIVE METABOLIC PANEL - Abnormal; Notable for the following components:      Result Value   Chloride 97 (*)    Glucose, Bld 121 (*)    Total Protein 6.4 (*)    AST 14 (*)    Total Bilirubin 1.4 (*)    All other components within normal limits  URINALYSIS, COMPLETE (UACMP) WITH MICROSCOPIC - Abnormal; Notable for the following components:   Color, Urine AMBER (*)     APPearance CLEAR (*)    All other components within normal limits  RESP PANEL BY RT-PCR (FLU A&B, COVID) ARPGX2  LIPASE, BLOOD  CBC  BRAIN NATRIURETIC PEPTIDE  PROTIME-INR  APTT  TYPE AND SCREEN   ____________________________________________  RADIOLOGY  ED MD interpretation: CT of the abdomen and pelvis with IV contrast shows a distal small bowel obstruction with a transition point at the area of wall thickening  Official radiology report(s): CT Abdomen Pelvis W Contrast  Result Date: 06/07/2020 CLINICAL DATA:  Abdominal pain with episodes of diarrhea and constipation, had imaging imaging bowel obstruction suspected EXAM:  CT ABDOMEN AND PELVIS WITH CONTRAST TECHNIQUE: Multidetector CT imaging of the abdomen and pelvis was performed using the standard protocol following bolus administration of intravenous contrast. CONTRAST:  177mL OMNIPAQUE IOHEXOL 300 MG/ML  SOLN COMPARISON:  Outside recent radiograph is unavailable FINDINGS: Lower chest: Bibasilar atelectasis/scarring. Hepatobiliary: No focal liver abnormality is seen. No gallstones, gallbladder wall thickening, or biliary dilatation. Pancreas: Unremarkable. Spleen: Unremarkable. Adrenals/Urinary Tract: Adrenals are unremarkable. Bilateral renal cysts and too small to characterize low-attenuation lesions. Bladder is partially obscured by artifact but appears unremarkable. Stomach/Bowel: Stomach is within normal limits. There are distended loops of small bowel with air-fluid levels there are some relatively decompressed loops of distal small bowel. Transition point appears to be at an area of wall thickening as seen on series 5, image 28. Extensive sigmoid diverticulosis. Vascular/Lymphatic: Diffuse atherosclerosis. Abdominal aortic aneurysm post aorta bi-iliac stenting. No enlarged lymph nodes identified. Reproductive: Unremarkable. Other: Small volume free fluid in the pelvis, adjacent to bowel loops, and adjacent to the liver. No acute  abnormality of the abdominal wall. Musculoskeletal: Right hip arthroplasty with associated streak artifact. Lumbar spine degenerative changes. Multilevel canal stenosis. Likely severe stenosis L3-L4. No acute osseous abnormality. IMPRESSION: Distal small bowel obstruction. Transition point appears to be at an area of wall thickening (series 5, image 28). Small volume abdominopelvic free fluid. Extensive sigmoid diverticulosis. Aorta bi-iliac stent. Lumbar spine degenerative changes superimposed on congenital canal narrowing. Likely severe stenosis at L3-L4. Electronically Signed   By: Macy Mis M.D.   On: 06/07/2020 12:29    ____________________________________________   PROCEDURES  Procedure(s) performed (including Critical Care):  .1-3 Lead EKG Interpretation Performed by: Naaman Plummer, MD Authorized by: Naaman Plummer, MD     Interpretation: normal     ECG rate:  75   ECG rate assessment: normal     Rhythm: sinus rhythm     Ectopy: none     Conduction: normal       ____________________________________________   INITIAL IMPRESSION / ASSESSMENT AND PLAN / ED COURSE  As part of my medical decision making, I reviewed the following data within the [place] notes reviewed and incorporated, Labs reviewed, EKG interpreted, Old chart reviewed, Radiograph reviewed and Notes from prior ED visits reviewed and incorporated        Given History, Exam I believe patient needs labs and imaging to evaluate for SBO vs other acute abdomen. ED Workup: CBC, BMP, LFTs, CT Abdomen/Pelvis ED Findings: CT: Small Bowel Obstruction  History, Exam, and Workup show no overt evidence of mesenteric ischemia, bowel gangrene, abscess, peritonitis. ED Interventions: Analgesia. Defer ABX at this time. Consult: General Surgery Disposition: Admit      ____________________________________________   FINAL CLINICAL IMPRESSION(S) / ED DIAGNOSES  Final diagnoses:   Small bowel obstruction ([place])  Generalized abdominal pain     ED Discharge Orders    None       Note:  This document was prepared using Dragon voice recognition software and may include unintentional dictation errors.   Naaman Plummer, MD 06/07/20 [phone]

## 2020-06-07 NOTE — Consult Note (Signed)
Subjective:   CC: SBO  HPI:  Jesse Allison is a 80 y.o. male who was consulted by Blaine Hamper for issue above.  Symptoms were first noted 10 days ago. Pain is intermittent, confined to the periumbilical area, without radiation.  Associated with nausea vomiting, exacerbated by nothing specific.  Overall symptoms have improved and he has had flatus and another bowel movement since the CT scan.  He still feels slight bloating at this time.   Past Medical History:  has a past medical history of Anemia, Anxiety, Arthritis, Cancer ([place]) (6962), Complication of anesthesia, Coronary artery disease, Dyspnea, GERD (gastroesophageal reflux disease), Gout, Hypercholesterolemia, Hypertension, Hypothyroidism, Myocardial infarction ([place]) (1993), Obesity (BMI 30.0-34.9) (08/11/2019), PONV (postoperative nausea and vomiting), Sleep apnea, and TIA (transient ischemic attack).  Past Surgical History:  Past Surgical History:  Procedure Laterality Date  . ABDOMINAL AORTIC ANEURYSM REPAIR  2010   no longer followed on a regular basis  . BACK SURGERY  2000   L4 . piece of vertebrae broke off and lodged into spine. had to remove the loose piece.  Marland Kitchen CARDIAC CATHETERIZATION    . CATARACT EXTRACTION W/PHACO Left 12/12/2016   Procedure: CATARACT EXTRACTION PHACO AND INTRAOCULAR LENS PLACEMENT (IOC);  Surgeon: Birder Robson, MD;  Location: [facility] ORS;  Service: Ophthalmology;  Laterality: Left;  [place] 00:38.1 AP% 14.1 CDE 5.38 FLUID PACK LOT # [id number]  . CATARACT EXTRACTION W/PHACO Right 12/26/2016   Procedure: CATARACT EXTRACTION PHACO AND INTRAOCULAR LENS PLACEMENT (IOC);  Surgeon: Birder Robson, MD;  Location: [facility] ORS;  Service: Ophthalmology;  Laterality: Right;  [place] 00:53.7 AP% 12.9 CDE 6.92 Fluid Pack Lot # [id number]  . CORONARY ARTERY BYPASS GRAFT  1993  . EYE SURGERY Bilateral   . LARYNGOSCOPY  2015   tumor removal followed by radiation treatments.  . TESTICLAR CYST EXCISION    . TOTAL HIP ARTHROPLASTY Right  08/21/2019   Procedure: TOTAL HIP ARTHROPLASTY ANTERIOR APPROACH;  Surgeon: Hessie Knows, MD;  Location: [facility] ORS;  Service: Orthopedics;  Laterality: Right;    Family History: Reviewed and not relevant to chief complaint.  Negative for cancer history  Social History:  reports that he quit smoking about 12 years ago. His smoking use included cigarettes. He has never used smokeless tobacco. He reports current alcohol use. He reports that he does not use drugs.  Current Medications:  Prior to Admission medications   Medication Sig Start Date End Date Taking? Authorizing Provider  allopurinol (ZYLOPRIM) 300 MG tablet Take 300 mg by mouth daily. 11/04/18  Yes [provider]  aspirin EC 81 MG tablet Take 1 tablet (81 mg total) by mouth in the morning and at bedtime. 08/22/19  Yes Duanne Guess, PA-C  calcium carbonate (OSCAL) 1500 (600 Ca) MG TABS tablet Take 600 mg of elemental calcium by mouth at bedtime.    Yes [provider]  carvedilol (COREG) 25 MG tablet Take 0.5 tablets by mouth 2 (two) times daily. 10/15/19  Yes [provider]  clopidogrel (PLAVIX) 75 MG tablet Take 75 mg by mouth daily. 01/23/19  Yes [provider]  cyanocobalamin 1000 MCG tablet Take 1,000 mcg by mouth daily after lunch.    Yes [provider]  furosemide (LASIX) 40 MG tablet Take 40 mg by mouth daily. 07/17/19  Yes [provider]  Iron, Ferrous Sulfate, 142 (45 Fe) MG TBCR Take 45 mg by mouth daily with lunch.    Yes [provider]  levothyroxine (SYNTHROID, LEVOTHROID) 50 MCG tablet Take  50 mcg by mouth daily before breakfast.   Yes [provider]  losartan (COZAAR) 50 MG tablet Take 50 mg by mouth daily. 04/18/19  Yes [provider]  lovastatin (MEVACOR) 20 MG tablet Take 20 mg by mouth daily at 6 PM.   Yes [provider]  pantoprazole (PROTONIX) 40 MG tablet Take 40 mg by mouth daily. 07/17/19  Yes [provider]   potassium chloride (KLOR-CON) 10 MEQ tablet Take 10 mEq by mouth daily. 01/23/19  Yes [provider]  tamsulosin (FLOMAX) 0.4 MG CAPS capsule Take 0.4 mg by mouth daily. Take after 30 minutes after same meal each day 04/17/20  Yes [provider]  albuterol (VENTOLIN HFA) 108 (90 Base) MCG/ACT inhaler Inhale 1-2 puffs into the lungs every 6 (six) hours as needed for wheezing or shortness of breath.  05/27/18   [provider]  azelastine (ASTELIN) 0.1 % nasal spray Place 1-2 sprays into both nostrils 2 (two) times daily as needed for allergies.  04/13/18   [provider]  colchicine 0.6 MG tablet Take 0.6 mg by mouth daily. 08/22/19   [provider]  nitroGLYCERIN (NITROSTAT) 0.4 MG SL tablet Place under the tongue. 06/24/19   [provider]    Allergies:  Allergies as of 06/07/2020 - Review Complete 06/07/2020  Allergen Reaction Noted  . Niacin Other (See Comments) 08/25/2013  . Oxycodone Other (See Comments) 08/11/2019  . Prednisone Other (See Comments) 08/25/2013    ROS:  General: Denies weight loss, weight gain, fatigue, fevers, chills, and night sweats. Eyes: Denies blurry vision, double vision, eye pain, itchy eyes, and tearing. Ears: Denies hearing loss, earache, and ringing in ears. Nose: Denies sinus pain, congestion, infections, runny nose, and nosebleeds. Mouth/throat: Denies hoarseness, sore throat, bleeding gums, and difficulty swallowing. Heart: Denies chest pain, palpitations, racing heart, irregular heartbeat, leg pain or swelling, and decreased activity tolerance. Respiratory: Denies breathing difficulty, shortness of breath, wheezing, cough, and sputum. GI: Denies change in appetite, heartburn, nausea, vomiting, constipation, diarrhea, and blood in stool. GU: Denies difficulty urinating, pain with urinating, urgency, frequency, blood in urine. Musculoskeletal: Denies joint stiffness, pain, swelling, muscle  weakness. Skin: Denies rash, itching, mass, tumors, sores, and boils Neurologic: Denies headache, fainting, dizziness, seizures, numbness, and tingling. Psychiatric: Denies depression, anxiety, difficulty sleeping, and memory loss. Endocrine: Denies heat or cold intolerance, and increased thirst or urination. Blood/lymph: Denies easy bruising, easy bruising, and swollen glands     Objective:     BP 139/71 (BP Location: Right Arm)   Pulse 76   Temp 98.1 F (36.7 C) (Oral)   Resp 18   Ht 5\' 11"  (1.803 m)   Wt 113.4 kg   SpO2 96%   BMI 34.87 kg/m   Constitutional :  alert, cooperative, appears stated age and no distress  Lymphatics/Throat:  no asymmetry, masses, or scars  Respiratory:  clear to auscultation bilaterally  Cardiovascular:  regular rate and rhythm  Gastrointestinal: Soft, no guarding, but some distention noted with palpation.  Easily reducible small umbilical hernia..   Musculoskeletal: Steady movement  Skin: Cool and moist  Psychiatric: Normal affect, non-agitated, not confused       LABS:  CMP Latest Ref Rng & Units 06/07/2020 08/23/2019 08/21/2019  Glucose 70 - 99 mg/dL 121(H) 158(H) 179(H)  BUN 8 - 23 mg/dL 18 15 13   Creatinine 0.61 - 1.24 mg/dL 1.00 0.71 0.82  Sodium 135 - 145 mmol/L 135 136 134(L)  Potassium 3.5 - 5.1 mmol/L  4.1 4.2 4.7  Chloride 98 - 111 mmol/L 97(L) 99 101  CO2 22 - 32 mmol/L 27 30 26   Calcium 8.9 - 10.3 mg/dL 9.0 8.7(L) 8.6(L)  Total Protein 6.5 - 8.1 g/dL 6.4(L) - -  Total Bilirubin 0.3 - 1.2 mg/dL 1.4(H) - -  Alkaline Phos 38 - 126 U/L 58 - -  AST 15 - 41 U/L 14(L) - -  ALT 0 - 44 U/L 13 - -   CBC Latest Ref Rng & Units 06/07/2020 08/23/2019 08/22/2019  WBC 4.0 - 10.5 K/uL 7.3 12.6(H) 10.7(H)  Hemoglobin 13.0 - 17.0 g/dL 13.0 11.3(L) 11.9(L)  Hematocrit 39.0 - 52.0 % 40.1 33.2(L) 37.0(L)  Platelets 150 - 400 K/uL 218 258 253    RADS: CLINICAL DATA:  Abdominal pain with episodes of diarrhea and constipation, had imaging imaging  bowel obstruction suspected  EXAM: CT ABDOMEN AND PELVIS WITH CONTRAST  TECHNIQUE: Multidetector CT imaging of the abdomen and pelvis was performed using the standard protocol following bolus administration of intravenous contrast.  CONTRAST:  129mL OMNIPAQUE IOHEXOL 300 MG/ML  SOLN  COMPARISON:  Outside recent radiograph is unavailable  FINDINGS: Lower chest: Bibasilar atelectasis/scarring.  Hepatobiliary: No focal liver abnormality is seen. No gallstones, gallbladder wall thickening, or biliary dilatation.  Pancreas: Unremarkable.  Spleen: Unremarkable.  Adrenals/Urinary Tract: Adrenals are unremarkable. Bilateral renal cysts and too small to characterize low-attenuation lesions. Bladder is partially obscured by artifact but appears unremarkable.  Stomach/Bowel: Stomach is within normal limits. There are distended loops of small bowel with air-fluid levels there are some relatively decompressed loops of distal small bowel. Transition point appears to be at an area of wall thickening as seen on series 5, image 28. Extensive sigmoid diverticulosis.  Vascular/Lymphatic: Diffuse atherosclerosis. Abdominal aortic aneurysm post aorta bi-iliac stenting. No enlarged lymph nodes identified.  Reproductive: Unremarkable.  Other: Small volume free fluid in the pelvis, adjacent to bowel loops, and adjacent to the liver. No acute abnormality of the abdominal wall.  Musculoskeletal: Right hip arthroplasty with associated streak artifact. Lumbar spine degenerative changes. Multilevel canal stenosis. Likely severe stenosis L3-L4. No acute osseous abnormality.  IMPRESSION: Distal small bowel obstruction. Transition point appears to be at an area of wall thickening (series 5, image 28).  Small volume abdominopelvic free fluid.  Extensive sigmoid diverticulosis.  Aorta bi-iliac stent.  Lumbar spine degenerative changes superimposed on congenital  canal narrowing. Likely severe stenosis at L3-L4.   Electronically Signed   By: Macy Mis M.D.   On: 06/07/2020 12:29  Assessment:   SBO with CT findings noted above.  I personally reviewed the images myself and agree with the findings above.  Unsure etiology of the small bowel narrowing that was noted.  Plan:   Discussed with patient that since his symptoms are resolving we will start a clear liquid diet and see how he tolerates.  If he continues to improve, may be able to follow-up with a repeat CT scan to see if the small bowel narrowing is persistent.  If the narrowing is persisting, we may need to pursue potential elective diagnostic laparoscopy to figure out if this is a true pathology such as a new neoplasm that needs to be addressed.  With patient age and comorbidities, he requested to hold off on any immediate surgery for further diagnostic purposes.  He agrees with resuming a diet to see if symptoms continue to improve.  We will continue to monitor with serial abdominal exams in the meantime.  Continue current management of  comorbidities per hospitalist.

## 2020-06-07 NOTE — ED Triage Notes (Signed)
Pt c/o mid to upper abd pain for the past 10 days with periods of diarrhea and constipation, states he went to PCP at [facility] on Friday and had labs and imaging, called this morning and told his has a bowel obstruction.

## 2020-06-07 NOTE — ED Notes (Signed)
One bottle of contrast finished, pt unable to tolerate second bottle.  Dr. Cheri Fowler aware.  CT notified.

## 2020-06-08 ENCOUNTER — Telehealth: Payer: Self-pay | Admitting: Gastroenterology

## 2020-06-08 DIAGNOSIS — K56609 Unspecified intestinal obstruction, unspecified as to partial versus complete obstruction: Principal | ICD-10-CM

## 2020-06-08 LAB — GLUCOSE, CAPILLARY
Glucose-Capillary: 87 mg/dL (ref 70–99)
Glucose-Capillary: 103 mg/dL — ABNORMAL HIGH (ref 70–99)

## 2020-06-08 LAB — BASIC METABOLIC PANEL
Anion gap: 8 (ref 5–15)
BUN: 17 mg/dL (ref 8–23)
CO2: 26 mmol/L (ref 22–32)
Calcium: 8.4 mg/dL — ABNORMAL LOW (ref 8.9–10.3)
Chloride: 102 mmol/L (ref 98–111)
Creatinine, Ser: 0.85 mg/dL (ref 0.61–1.24)
GFR, Estimated: >60 mL/min (ref >60)
Glucose, Bld: 82 mg/dL (ref 70–99)
Potassium: 3.6 mmol/L (ref 3.5–5.1)
Sodium: 136 mmol/L (ref 135–145)

## 2020-06-08 LAB — CBC
HCT: 36 % — ABNORMAL LOW (ref 39.0–52.0)
Hemoglobin: 11.7 g/dL — ABNORMAL LOW (ref 13.0–17.0)
MCH: 29.4 pg (ref 26.0–34.0)
MCHC: 32.5 g/dL (ref 30.0–36.0)
MCV: 90.5 fL (ref 80.0–100.0)
Platelets: 187 10*3/uL (ref 150–400)
RBC: 3.98 MIL/uL — ABNORMAL LOW (ref 4.22–5.81)
RDW: 14.2 % (ref 11.5–15.5)
WBC: 5.7 10*3/uL (ref 4.0–10.5)
nRBC: 0 % (ref 0.0–0.2)

## 2020-06-08 NOTE — Telephone Encounter (Signed)
Called LVM for patient to call us back to schedule a 2-3 week follow up per Dr Marius Ditch-  Madaline Brilliant to [facility].

## 2020-06-08 NOTE — Progress Notes (Signed)
Mliss Sax to be D/C'd Home per MD order.  Discussed prescriptions and follow up appointments with the patient. Prescriptions given to patient, medication list explained in detail. Pt verbalized understanding.  Allergies as of 06/08/2020      Reactions   Niacin Other (See Comments)   Leg cramps Leg cramps Leg cramps   Oxycodone Other (See Comments)   Makes him loopy and out of control.  Does not like the sensation. Makes him loopy and out of control.  Does not like the sensation.   Prednisone Other (See Comments)   Severe pain D/T stopping prednisone immediately rather than weaning it. Severe pain D/T stopping prednisone immediately rather than weaning it.      Medication List    TAKE these medications   albuterol 108 (90 Base) MCG/ACT inhaler Commonly known as: VENTOLIN HFA Inhale 1-2 puffs into the lungs every 6 (six) hours as needed for wheezing or shortness of breath.   allopurinol 300 MG tablet Commonly known as: ZYLOPRIM Take 300 mg by mouth daily.   aspirin EC 81 MG tablet Take 1 tablet (81 mg total) by mouth in the morning and at bedtime.   azelastine 0.1 % nasal spray Commonly known as: ASTELIN Place 1-2 sprays into both nostrils 2 (two) times daily as needed for allergies.   calcium carbonate 1500 (600 Ca) MG Tabs tablet Commonly known as: OSCAL Take 600 mg of elemental calcium by mouth at bedtime.   carvedilol 25 MG tablet Commonly known as: COREG Take 0.5 tablets by mouth 2 (two) times daily.   clopidogrel 75 MG tablet Commonly known as: PLAVIX Take 75 mg by mouth daily.   colchicine 0.6 MG tablet Take 0.6 mg by mouth daily.   cyanocobalamin 1000 MCG tablet Take 1,000 mcg by mouth daily after lunch.   furosemide 40 MG tablet Commonly known as: LASIX Take 40 mg by mouth daily.   Iron (Ferrous Sulfate) 142 (45 Fe) MG Tbcr Take 45 mg by mouth daily with lunch.   levothyroxine 50 MCG tablet Commonly known as: SYNTHROID Take 50 mcg by mouth  daily before breakfast.   losartan 50 MG tablet Commonly known as: COZAAR Take 50 mg by mouth daily.   lovastatin 20 MG tablet Commonly known as: MEVACOR Take 20 mg by mouth daily at 6 PM.   nitroGLYCERIN 0.4 MG SL tablet Commonly known as: NITROSTAT Place under the tongue.   pantoprazole 40 MG tablet Commonly known as: PROTONIX Take 40 mg by mouth daily.   potassium chloride 10 MEQ tablet Commonly known as: KLOR-CON Take 10 mEq by mouth daily.   tamsulosin 0.4 MG Caps capsule Commonly known as: FLOMAX Take 0.4 mg by mouth daily. Take after 30 minutes after same meal each day       Vitals:   06/08/20 0714 06/08/20 1124  BP: 135/60 (!) 108/58  Pulse: 70 62  Resp: 18 20  Temp: 97.9 F (36.6 C) 98.9 F (37.2 C)  SpO2: 93% 98%    Skin clean, dry and intact without evidence of skin break down, no evidence of skin tears noted. IV catheter discontinued intact. Site without signs and symptoms of complications. Dressing and pressure applied. Pt denies pain at this time. No complaints noted.  An After Visit Summary was printed and given to the patient. Patient escorted via [place], and D/C home via private auto.  Fuller Mandril, RN

## 2020-06-08 NOTE — Progress Notes (Signed)
Subjective:  CC: Jesse Allison is a 80 y.o. male  Hospital stay day 1,   SBO  HPI: Mult BMs overnight.  No complaints. Tolerating clears  ROS:  General: Denies weight loss, weight gain, fatigue, fevers, chills, and night sweats. Heart: Denies chest pain, palpitations, racing heart, irregular heartbeat, leg pain or swelling, and decreased activity tolerance. Respiratory: Denies breathing difficulty, shortness of breath, wheezing, cough, and sputum. GI: Denies change in appetite, heartburn, nausea, vomiting, constipation, diarrhea, and blood in stool. GU: Denies difficulty urinating, pain with urinating, urgency, frequency, blood in urine.   Objective:   Temp:  [97.4 F (36.3 C)-98.6 F (37 C)] 97.9 F (36.6 C) (03/01 0714) Pulse Rate:  [67-76] 70 (03/01 0714) Resp:  [18-20] 18 (03/01 0714) BP: (135-165)/(60-73) 135/60 (03/01 0714) SpO2:  [93 %-99 %] 93 % (03/01 0714) Weight:  [111.4 kg] 111.4 kg (03/01 0600)     Height: 5\' 11"  (180.3 cm) Weight: 111.4 kg BMI (Calculated): 34.27   Intake/Output this shift:   Intake/Output Summary (Last 24 hours) at 06/08/2020 1102 Last data filed at 06/08/2020 0900 Gross per 24 hour  Intake 1047.53 ml  Output 601 ml  Net 446.53 ml    Constitutional :  alert, cooperative, appears stated age and no distress  Respiratory:  clear to auscultation bilaterally  Cardiovascular:  regular rate and rhythm  Gastrointestinal: soft, non-tender; bowel sounds normal; no masses,  no organomegaly.   Skin: Cool and moist.   Psychiatric: Normal affect, non-agitated, not confused       LABS:  CMP Latest Ref Rng & Units 06/08/2020 06/07/2020 08/23/2019  Glucose 70 - 99 mg/dL 82 121(H) 158(H)  BUN 8 - 23 mg/dL 17 18 15   Creatinine 0.61 - 1.24 mg/dL 0.85 1.00 0.71  Sodium 135 - 145 mmol/L 136 135 136  Potassium 3.5 - 5.1 mmol/L 3.6 4.1 4.2  Chloride 98 - 111 mmol/L 102 97(L) 99  CO2 22 - 32 mmol/L 26 27 30   Calcium 8.9 - 10.3 mg/dL 8.4(L) 9.0 8.7(L)  Total  Protein 6.5 - 8.1 g/dL - 6.4(L) -  Total Bilirubin 0.3 - 1.2 mg/dL - 1.4(H) -  Alkaline Phos 38 - 126 U/L - 58 -  AST 15 - 41 U/L - 14(L) -  ALT 0 - 44 U/L - 13 -   CBC Latest Ref Rng & Units 06/08/2020 06/07/2020 08/23/2019  WBC 4.0 - 10.5 K/uL 5.7 7.3 12.6(H)  Hemoglobin 13.0 - 17.0 g/dL 11.7(L) 13.0 11.3(L)  Hematocrit 39.0 - 52.0 % 36.0(L) 40.1 33.2(L)  Platelets 150 - 400 K/uL 187 218 258    RADS: n/a Assessment:   SBO- improving and tolerating clears.  Will see how he does with regular diet for lunch and if tolerating and otherwise doing well, ok to d/c from surgery standpoint and f/u as outpt with GI for possible IBD workup. Symptoms presentation and CT findings seems liks IBD, although his age will be extremely rare to develop at late age.

## 2020-06-08 NOTE — Discharge Summary (Signed)
[facility] [place] NAME: Jesse Allison    MR#:  [id number]  DATE OF BIRTH:  07-Dec-1940  DATE OF ADMISSION:  06/07/2020 ADMITTING PHYSICIAN: Ivor Costa, MD  DATE OF DISCHARGE: 06/08/2020  PRIMARY CARE PHYSICIAN: Adin Hector, MD    ADMISSION DIAGNOSIS:  Small bowel obstruction ([place]) [K56.609] SBO (small bowel obstruction) ([place]) [K56.609] Generalized abdominal pain [R10.84]  DISCHARGE DIAGNOSIS:  SBO--improved  SECONDARY DIAGNOSIS:   Past Medical History:  Diagnosis Date  . Anemia    iron deficiency  . Anxiety   . Arthritis   . Cancer ([place]) 2015   throat. had surgery and radiation  . Complication of anesthesia    woke up during AAA repair. Had an epidural and does NOT want again.  . Coronary artery disease   . Dyspnea    chronic cough since teenage years.  Marland Kitchen GERD (gastroesophageal reflux disease)   . Gout   . Hypercholesterolemia   . Hypertension   . Hypothyroidism   . Myocardial infarction ([place]) 1993  . Obesity (BMI 30.0-34.9) 08/11/2019  . PONV (postoperative nausea and vomiting)    Several times, vomitting for hours after surgery.  . Sleep apnea   . TIA (transient ischemic attack)     HOSPITAL COURSE:  Jesse Allison is a 80 y.o. male with medical history significant of sCHF with EF 35-40%, CAD with CABG, laryngeal cancer (s/p of radiation and surgery), anemia, AAA repair, PVD, HTN, HLD, DM, TIA, hypothyroidism, gout, depression, who present with abdominal pain. Patient has abdominal pain in the past 10 days.  Patient has intermittent diarrhea and constipation.  Abdominal pain is diffuse, constant, cramping, moderate to severe, nonradiating.   SBO (small bowel obstruction) ([place]): Currently no nausea vomiting.  General surgeon, Dr. Ines Bloomer input appreciated -As needed Zofran for nausea -received IV fluid -- no fever white count normal. Hemodynamically stable and no signs of infection noted. -CT  abdomen/pelvis:  Distal small bowel obstruction. Transition point appears to be at an area of wall thickening (series 5, image 28).   Small volume abdominopelvic free fluid.  Extensive sigmoid diverticulosis.  Aorta bi-iliac stent.  Lumbar spine degenerative changes superimposed on congenital canal narrowing. Likely severe stenosis at L3-L4 -- patient tolerating regular diet. Had soft Moshe bowel movement today. Feels back to baseline -- will refer patient to Dr. Marius Ditch as outpatient given transition point and small bowel narrowing as seen on CT scan to rule out IBD. Dr. Marius Ditch informed.  Anemia, iron deficiency: Hemoglobin stable, 13.0 -Continue iron supplement  CAD (coronary artery disease), autologous vein bypass graft -Aspirin, pravastatin  COPD (chronic obstructive pulmonary disease) (HCC) -Bronchodilators  Mixed hyperlipidemia -Pravastatin  Chronic systolic CHF (congestive heart failure) ([place]): 2D echo on 07/28/2019 showed EF 35-40%.  Patient has leg edema, but no respiratory distress.  Does not seem to have CHF exacerbation -- resume home meds  HTN (hypertension) -IV hydralazine as needed -Cozaar, Coreg  TIA (transient ischemic attack) -Pravastatin -resume Plavix  Hypothyroidism -Synthroid  discussed with Dr. Lysle Pearl earlier. He recommends patient can discharge home if he tolerated regular diet. Patient feels better and in agreement for discharging to home.  DVT ppx: SCD Code Status: Full code Family Communication:    done today disposition Plan:  Anticipate discharge back to previous environment Consults called:  Dr. Lysle Pearl of surgery Admission status and Level of care: Med-Surg:   as inpt       Status is: Inpatient   Dispo:  The patient is from: Home  Anticipated d/c is to: Home today  Patient currently is  medically stable to d/c.              Difficult to place patient No  CONSULTS OBTAINED:  Treatment Team:  Benjamine Sprague, DO  DRUG ALLERGIES:   Allergies  Allergen Reactions  . Niacin Other (See Comments)    Leg cramps Leg cramps  Leg cramps  . Oxycodone Other (See Comments)    Makes him loopy and out of control.  Does not like the sensation. Makes him loopy and out of control.  Does not like the sensation.  . Prednisone Other (See Comments)    Severe pain D/T stopping prednisone immediately rather than weaning it. Severe pain D/T stopping prednisone immediately rather than weaning it.    DISCHARGE MEDICATIONS:   Allergies as of 06/08/2020      Reactions   Niacin Other (See Comments)   Leg cramps Leg cramps Leg cramps   Oxycodone Other (See Comments)   Makes him loopy and out of control.  Does not like the sensation. Makes him loopy and out of control.  Does not like the sensation.   Prednisone Other (See Comments)   Severe pain D/T stopping prednisone immediately rather than weaning it. Severe pain D/T stopping prednisone immediately rather than weaning it.      Medication List    TAKE these medications   albuterol 108 (90 Base) MCG/ACT inhaler Commonly known as: VENTOLIN HFA Inhale 1-2 puffs into the lungs every 6 (six) hours as needed for wheezing or shortness of breath.   allopurinol 300 MG tablet Commonly known as: ZYLOPRIM Take 300 mg by mouth daily.   aspirin EC 81 MG tablet Take 1 tablet (81 mg total) by mouth in the morning and at bedtime.   azelastine 0.1 % nasal spray Commonly known as: ASTELIN Place 1-2 sprays into both nostrils 2 (two) times daily as needed for allergies.   calcium carbonate 1500 (600 Ca) MG Tabs tablet Commonly known as: OSCAL Take 600 mg of elemental calcium by mouth at bedtime.   carvedilol 25 MG tablet Commonly known as: COREG Take 0.5 tablets by mouth 2 (two) times daily.   clopidogrel 75 MG tablet Commonly known as: PLAVIX Take 75 mg by mouth daily.   colchicine 0.6 MG tablet Take 0.6 mg by mouth daily.   cyanocobalamin 1000 MCG  tablet Take 1,000 mcg by mouth daily after lunch.   furosemide 40 MG tablet Commonly known as: LASIX Take 40 mg by mouth daily.   Iron (Ferrous Sulfate) 142 (45 Fe) MG Tbcr Take 45 mg by mouth daily with lunch.   levothyroxine 50 MCG tablet Commonly known as: SYNTHROID Take 50 mcg by mouth daily before breakfast.   losartan 50 MG tablet Commonly known as: COZAAR Take 50 mg by mouth daily.   lovastatin 20 MG tablet Commonly known as: MEVACOR Take 20 mg by mouth daily at 6 PM.   nitroGLYCERIN 0.4 MG SL tablet Commonly known as: NITROSTAT Place under the tongue.   pantoprazole 40 MG tablet Commonly known as: PROTONIX Take 40 mg by mouth daily.   potassium chloride 10 MEQ tablet Commonly known as: KLOR-CON Take 10 mEq by mouth daily.   tamsulosin 0.4 MG Caps capsule Commonly known as: FLOMAX Take 0.4 mg by mouth daily. Take after 30 minutes after same meal each day       If you experience worsening of your admission symptoms, develop  shortness of breath, life threatening emergency, suicidal or homicidal thoughts you must seek medical attention immediately by calling 911 or calling your MD immediately  if symptoms less severe.  You Must read complete instructions/literature along with all the possible adverse reactions/side effects for all the Medicines you take and that have been prescribed to you. Take any new Medicines after you have completely understood and accept all the possible adverse reactions/side effects.   Please note  You were cared for by a hospitalist during your hospital stay. If you have any questions about your discharge medications or the care you received while you were in the hospital after you are discharged, you can call the unit and asked to speak with the hospitalist on call if the hospitalist that took care of you is not available. Once you are discharged, your primary care physician will handle any further medical issues. Please note that NO  REFILLS for any discharge medications will be authorized once you are discharged, as it is imperative that you return to your primary care physician (or establish a relationship with a primary care physician if you do not have one) for your aftercare needs so that they can reassess your need for medications and monitor your lab values. Today   SUBJECTIVE   Sitting out in the chair. Rated clear liquid earlier and regular diet now. Had me she bowel movement today. No fever no abdominal pain or vomiting.  VITAL SIGNS:  Blood pressure (!) 108/58, pulse 62, temperature 98.9 F (37.2 C), temperature source Oral, resp. rate 20, height 5\' 11"  (1.803 m), weight 111.4 kg, SpO2 98 %.  I/O:    Intake/Output Summary (Last 24 hours) at 06/08/2020 1510 Last data filed at 06/08/2020 1339 Gross per 24 hour  Intake 1350.12 ml  Output 801 ml  Net 549.12 ml    PHYSICAL EXAMINATION:  GENERAL:  80 y.o.-year-old patient lying in the bed with no acute distress. LUNGS: Normal breath sounds bilaterally, no wheezing, rales,rhonchi or crepitation. No use of accessory muscles of respiration.  CARDIOVASCULAR: S1, S2 normal. No murmurs, rubs, or gallops.  ABDOMEN: Soft, non-tender, non-distended. Bowel sounds present. No organomegaly or mass. Abdominal obesity EXTREMITIES: No pedal edema, cyanosis, or clubbing.  NEUROLOGIC: Cranial nerves II through XII are intact. Muscle strength 5/5 in all extremities. Sensation intact. Gait not checked.  PSYCHIATRIC: The patient is alert and oriented x 3.  SKIN: No obvious rash, lesion, or ulcer.   DATA REVIEW:   CBC  Recent Labs  Lab 06/08/20 0435  WBC 5.7  HGB 11.7*  HCT 36.0*  PLT 187    Chemistries  Recent Labs  Lab 06/07/20 0922 06/08/20 0435  NA 135 136  K 4.1 3.6  CL 97* 102  CO2 27 26  GLUCOSE 121* 82  BUN 18 17  CREATININE 1.00 0.85  CALCIUM 9.0 8.4*  AST 14*  --   ALT 13  --   ALKPHOS 58  --   BILITOT 1.4*  --     Microbiology Results    Recent Results (from the past 240 hour(s))  Resp Panel by RT-PCR (Flu A&B, Covid) Nasopharyngeal Swab     Status: None   Collection Time: 06/07/20  2:01 PM   Specimen: Nasopharyngeal Swab; Nasopharyngeal(NP) swabs in vial transport medium  Result Value Ref Range Status   SARS Coronavirus 2 by RT PCR NEGATIVE NEGATIVE Final    Comment: (NOTE) SARS-CoV-2 target nucleic acids are NOT DETECTED.  The SARS-CoV-2 RNA is generally detectable in  upper respiratory specimens during the acute phase of infection. The lowest concentration of SARS-CoV-2 viral copies this assay can detect is 138 copies/mL. A negative result does not preclude SARS-Cov-2 infection and should not be used as the sole basis for treatment or other patient management decisions. A negative result may occur with  improper specimen collection/handling, submission of specimen other than nasopharyngeal swab, presence of viral mutation(s) within the areas targeted by this assay, and inadequate number of viral copies(<138 copies/mL). A negative result must be combined with clinical observations, patient history, and epidemiological information. The expected result is Negative.  Fact Sheet for Patients:  EntrepreneurPulse.com.au  Fact Sheet for Healthcare Providers:  IncredibleEmployment.be  This test is no t yet approved or cleared by the [place] FDA and  has been authorized for detection and/or diagnosis of SARS-CoV-2 by FDA under an Emergency Use Authorization (EUA). This EUA will remain  in effect (meaning this test can be used) for the duration of the COVID-19 declaration under Section 564(b)(1) of the Act, 21 U.S.C.section 360bbb-3(b)(1), unless the authorization is terminated  or revoked sooner.       Influenza A by PCR NEGATIVE NEGATIVE Final   Influenza B by PCR NEGATIVE NEGATIVE Final    Comment: (NOTE) The Xpert Xpress SARS-CoV-2/FLU/RSV plus assay is intended as an  aid in the diagnosis of influenza from Nasopharyngeal swab specimens and should not be used as a sole basis for treatment. Nasal washings and aspirates are unacceptable for Xpert Xpress SARS-CoV-2/FLU/RSV testing.  Fact Sheet for Patients: EntrepreneurPulse.com.au  Fact Sheet for Healthcare Providers: IncredibleEmployment.be  This test is not yet approved or cleared by the [place] FDA and has been authorized for detection and/or diagnosis of SARS-CoV-2 by FDA under an Emergency Use Authorization (EUA). This EUA will remain in effect (meaning this test can be used) for the duration of the COVID-19 declaration under Section 564(b)(1) of the Act, 21 U.S.C. section 360bbb-3(b)(1), unless the authorization is terminated or revoked.  Performed at [facility], [address] [place], [place] [address]     RADIOLOGY:  CT Abdomen Pelvis W Contrast  Result Date: 06/07/2020 CLINICAL DATA:  Abdominal pain with episodes of diarrhea and constipation, had imaging imaging bowel obstruction suspected EXAM: CT ABDOMEN AND PELVIS WITH CONTRAST TECHNIQUE: Multidetector CT imaging of the abdomen and pelvis was performed using the standard protocol following bolus administration of intravenous contrast. CONTRAST:  133mL OMNIPAQUE IOHEXOL 300 MG/ML  SOLN COMPARISON:  Outside recent radiograph is unavailable FINDINGS: Lower chest: Bibasilar atelectasis/scarring. Hepatobiliary: No focal liver abnormality is seen. No gallstones, gallbladder wall thickening, or biliary dilatation. Pancreas: Unremarkable. Spleen: Unremarkable. Adrenals/Urinary Tract: Adrenals are unremarkable. Bilateral renal cysts and too small to characterize low-attenuation lesions. Bladder is partially obscured by artifact but appears unremarkable. Stomach/Bowel: Stomach is within normal limits. There are distended loops of small bowel with air-fluid levels there are some relatively  decompressed loops of distal small bowel. Transition point appears to be at an area of wall thickening as seen on series 5, image 28. Extensive sigmoid diverticulosis. Vascular/Lymphatic: Diffuse atherosclerosis. Abdominal aortic aneurysm post aorta bi-iliac stenting. No enlarged lymph nodes identified. Reproductive: Unremarkable. Other: Small volume free fluid in the pelvis, adjacent to bowel loops, and adjacent to the liver. No acute abnormality of the abdominal wall. Musculoskeletal: Right hip arthroplasty with associated streak artifact. Lumbar spine degenerative changes. Multilevel canal stenosis. Likely severe stenosis L3-L4. No acute osseous abnormality. IMPRESSION: Distal small bowel obstruction. Transition point appears to be at an  area of wall thickening (series 5, image 28). Small volume abdominopelvic free fluid. Extensive sigmoid diverticulosis. Aorta bi-iliac stent. Lumbar spine degenerative changes superimposed on congenital canal narrowing. Likely severe stenosis at L3-L4. Electronically Signed   By: Macy Mis M.D.   On: 06/07/2020 12:29     CODE STATUS:     Code Status Orders  (From admission, onward)         Start     Ordered   06/07/20 1736  Full code  Continuous        06/07/20 1735        Code Status History    Date Active Date Inactive Code Status Order ID Comments User Context   08/21/2019 1155 08/24/2019 2007 Full Code [id number]  Hessie Knows, MD Inpatient   Advance Care Planning Activity    Advance Directive Documentation   Flowsheet Row Most Recent Value  Type of Advance Directive Living will, Healthcare Power of Attorney  Pre-existing out of facility DNR order (yellow form or pink MOST form) --  "MOST" Form in Place? --       TOTAL TIME TAKING CARE OF THIS PATIENT: 35 minutes.    Fritzi Mandes M.D  [facility]    CC: Primary care physician; Adin Hector, MD

## 2020-06-10 ENCOUNTER — Encounter: Payer: Self-pay | Admitting: Gastroenterology

## 2020-06-11 ENCOUNTER — Ambulatory Visit
Admission: RE | Admit: 2020-06-11 | Discharge: 2020-06-11 | Disposition: A | Discharge status: Home / Self Care | Payer: PPO | Source: Ambulatory Visit | Attending: Gastroenterology | Admitting: Gastroenterology

## 2020-06-11 ENCOUNTER — Other Ambulatory Visit: Payer: Self-pay | Admitting: Gastroenterology

## 2020-06-11 DIAGNOSIS — R1084 Generalized abdominal pain: Secondary | ICD-10-CM | POA: Diagnosis not present

## 2020-06-11 DIAGNOSIS — R109 Unspecified abdominal pain: Secondary | ICD-10-CM | POA: Diagnosis not present

## 2020-06-11 DIAGNOSIS — R933 Abnormal findings on diagnostic imaging of other parts of digestive tract: Secondary | ICD-10-CM | POA: Diagnosis not present

## 2020-06-11 DIAGNOSIS — Z8719 Personal history of other diseases of the digestive system: Secondary | ICD-10-CM | POA: Diagnosis not present

## 2020-06-14 ENCOUNTER — Encounter: Payer: Self-pay | Admitting: *Deleted

## 2020-06-16 DIAGNOSIS — R1084 Generalized abdominal pain: Secondary | ICD-10-CM | POA: Diagnosis not present

## 2020-06-18 ENCOUNTER — Other Ambulatory Visit
Admission: RE | Admit: 2020-06-18 | Discharge: 2020-06-18 | Disposition: A | Discharge status: Home / Self Care | Payer: PPO | Source: Ambulatory Visit | Attending: Gastroenterology | Admitting: Gastroenterology

## 2020-06-18 ENCOUNTER — Other Ambulatory Visit: Payer: Self-pay

## 2020-06-18 DIAGNOSIS — Z20822 Contact with and (suspected) exposure to covid-19: Secondary | ICD-10-CM | POA: Diagnosis not present

## 2020-06-18 DIAGNOSIS — Z01812 Encounter for preprocedural laboratory examination: Secondary | ICD-10-CM | POA: Insufficient documentation

## 2020-06-18 LAB — SARS CORONAVIRUS 2 (TAT 6-24 HRS): SARS Coronavirus 2: NEGATIVE

## 2020-06-22 ENCOUNTER — Ambulatory Visit: Payer: PPO | Admitting: Anesthesiology

## 2020-06-22 ENCOUNTER — Encounter
Admission: RE | Disposition: A | Discharge status: Home / Self Care | Payer: Self-pay | Source: Home / Self Care | Attending: Gastroenterology

## 2020-06-22 ENCOUNTER — Other Ambulatory Visit: Payer: Self-pay

## 2020-06-22 ENCOUNTER — Ambulatory Visit
Admission: RE | Admit: 2020-06-22 | Discharge: 2020-06-22 | Disposition: A | Discharge status: Home / Self Care | Payer: PPO | Attending: Gastroenterology | Admitting: Gastroenterology

## 2020-06-22 ENCOUNTER — Encounter: Payer: Self-pay | Admitting: *Deleted

## 2020-06-22 DIAGNOSIS — K640 First degree hemorrhoids: Secondary | ICD-10-CM | POA: Diagnosis not present

## 2020-06-22 DIAGNOSIS — K648 Other hemorrhoids: Secondary | ICD-10-CM | POA: Diagnosis not present

## 2020-06-22 DIAGNOSIS — E669 Obesity, unspecified: Secondary | ICD-10-CM | POA: Diagnosis not present

## 2020-06-22 DIAGNOSIS — D123 Benign neoplasm of transverse colon: Secondary | ICD-10-CM | POA: Diagnosis not present

## 2020-06-22 DIAGNOSIS — I252 Old myocardial infarction: Secondary | ICD-10-CM | POA: Diagnosis not present

## 2020-06-22 DIAGNOSIS — D122 Benign neoplasm of ascending colon: Secondary | ICD-10-CM | POA: Insufficient documentation

## 2020-06-22 DIAGNOSIS — J449 Chronic obstructive pulmonary disease, unspecified: Secondary | ICD-10-CM | POA: Diagnosis not present

## 2020-06-22 DIAGNOSIS — Z79890 Hormone replacement therapy (postmenopausal): Secondary | ICD-10-CM | POA: Insufficient documentation

## 2020-06-22 DIAGNOSIS — Z888 Allergy status to other drugs, medicaments and biological substances status: Secondary | ICD-10-CM | POA: Insufficient documentation

## 2020-06-22 DIAGNOSIS — I5022 Chronic systolic (congestive) heart failure: Secondary | ICD-10-CM | POA: Insufficient documentation

## 2020-06-22 DIAGNOSIS — K635 Polyp of colon: Secondary | ICD-10-CM | POA: Diagnosis not present

## 2020-06-22 DIAGNOSIS — R933 Abnormal findings on diagnostic imaging of other parts of digestive tract: Secondary | ICD-10-CM | POA: Diagnosis not present

## 2020-06-22 DIAGNOSIS — R1084 Generalized abdominal pain: Secondary | ICD-10-CM | POA: Diagnosis not present

## 2020-06-22 DIAGNOSIS — K5730 Diverticulosis of large intestine without perforation or abscess without bleeding: Secondary | ICD-10-CM | POA: Insufficient documentation

## 2020-06-22 DIAGNOSIS — Z6833 Body mass index (BMI) 33.0-33.9, adult: Secondary | ICD-10-CM | POA: Diagnosis not present

## 2020-06-22 DIAGNOSIS — Z7982 Long term (current) use of aspirin: Secondary | ICD-10-CM | POA: Insufficient documentation

## 2020-06-22 DIAGNOSIS — Z7902 Long term (current) use of antithrombotics/antiplatelets: Secondary | ICD-10-CM | POA: Diagnosis not present

## 2020-06-22 DIAGNOSIS — Z885 Allergy status to narcotic agent status: Secondary | ICD-10-CM | POA: Insufficient documentation

## 2020-06-22 DIAGNOSIS — Z8589 Personal history of malignant neoplasm of other organs and systems: Secondary | ICD-10-CM | POA: Insufficient documentation

## 2020-06-22 DIAGNOSIS — Z8673 Personal history of transient ischemic attack (TIA), and cerebral infarction without residual deficits: Secondary | ICD-10-CM | POA: Insufficient documentation

## 2020-06-22 DIAGNOSIS — E7800 Pure hypercholesterolemia, unspecified: Secondary | ICD-10-CM | POA: Diagnosis not present

## 2020-06-22 DIAGNOSIS — Z79899 Other long term (current) drug therapy: Secondary | ICD-10-CM | POA: Diagnosis not present

## 2020-06-22 DIAGNOSIS — I2510 Atherosclerotic heart disease of native coronary artery without angina pectoris: Secondary | ICD-10-CM | POA: Diagnosis not present

## 2020-06-22 DIAGNOSIS — K5790 Diverticulosis of intestine, part unspecified, without perforation or abscess without bleeding: Secondary | ICD-10-CM | POA: Diagnosis not present

## 2020-06-22 DIAGNOSIS — I110 Hypertensive heart disease with heart failure: Secondary | ICD-10-CM | POA: Diagnosis not present

## 2020-06-22 DIAGNOSIS — E782 Mixed hyperlipidemia: Secondary | ICD-10-CM | POA: Diagnosis not present

## 2020-06-22 DIAGNOSIS — R935 Abnormal findings on diagnostic imaging of other abdominal regions, including retroperitoneum: Secondary | ICD-10-CM | POA: Diagnosis not present

## 2020-06-22 HISTORY — PX: COLONOSCOPY WITH PROPOFOL: SHX5780

## 2020-06-22 SURGERY — COLONOSCOPY WITH PROPOFOL
Anesthesia: General

## 2020-06-22 MED ORDER — LIDOCAINE HCL (CARDIAC) PF 100 MG/5ML IV SOSY
PREFILLED_SYRINGE | INTRAVENOUS | Status: DC | PRN
  Administered 2020-06-22: 50 mg via INTRAVENOUS

## 2020-06-22 MED ORDER — PROPOFOL 10 MG/ML IV BOLUS
INTRAVENOUS | Status: DC | PRN
  Administered 2020-06-22: 80 mg via INTRAVENOUS

## 2020-06-22 MED ORDER — PROPOFOL 10 MG/ML IV BOLUS
INTRAVENOUS | Status: AC
  Filled 2020-06-22: qty 40

## 2020-06-22 MED ORDER — PROPOFOL 500 MG/50ML IV EMUL
INTRAVENOUS | Status: DC | PRN
  Administered 2020-06-22: 150 ug/kg/min via INTRAVENOUS

## 2020-06-22 MED ORDER — LIDOCAINE HCL (PF) 2 % IJ SOLN
INTRAMUSCULAR | Status: AC
  Filled 2020-06-22: qty 5

## 2020-06-22 MED ORDER — PHENYLEPHRINE HCL (PRESSORS) 10 MG/ML IV SOLN
INTRAVENOUS | Status: DC | PRN
  Administered 2020-06-22: 50 ug via INTRAVENOUS
  Administered 2020-06-22 (×2): 100 ug via INTRAVENOUS
  Administered 2020-06-22: 50 ug via INTRAVENOUS
  Administered 2020-06-22: 100 ug via INTRAVENOUS

## 2020-06-22 MED ORDER — GLYCOPYRROLATE 0.2 MG/ML IJ SOLN
INTRAMUSCULAR | Status: AC
  Filled 2020-06-22: qty 1

## 2020-06-22 MED ORDER — SODIUM CHLORIDE 0.9 % IV SOLN: INTRAVENOUS | Status: DC

## 2020-06-22 MED ORDER — GLYCOPYRROLATE 0.2 MG/ML IJ SOLN
INTRAMUSCULAR | Status: DC | PRN
  Administered 2020-06-22: .2 mg via INTRAVENOUS

## 2020-06-22 NOTE — Interval H&P Note (Signed)
History and Physical Interval Note:  06/22/2020 12:42 PM  Jesse Allison  has presented today for surgery, with the diagnosis of ABN CT GEN ABD PAIN.  The various methods of treatment have been discussed with the patient and family. After consideration of risks, benefits and other options for treatment, the patient has consented to  Procedure(s): COLONOSCOPY WITH PROPOFOL (N/A) as a surgical intervention.  The patient's history has been reviewed, patient examined, no change in status, stable for surgery.  I have reviewed the patient's chart and labs.  Questions were answered to the patient's satisfaction.     Lesly Rubenstein  Ok to proceed with colonoscopy

## 2020-06-22 NOTE — Transfer of Care (Signed)
Immediate Anesthesia Transfer of Care Note  Patient: Jesse Allison  Procedure(s) Performed: COLONOSCOPY WITH PROPOFOL (N/A )  Patient Location: PACU and Endoscopy Unit  Anesthesia Type:General  Level of Consciousness: drowsy  Airway & Oxygen Therapy: Patient Spontanous Breathing  Post-op Assessment: Report given to RN  Post vital signs: stable  Last Vitals:  Vitals Value Taken Time  BP    Temp    Pulse    Resp    SpO2      Last Pain:  Vitals:   06/22/20 1215  PainSc: 0-No pain         Complications: No complications documented.

## 2020-06-22 NOTE — Op Note (Signed)
[facility] Gastroenterology Patient Name: Jesse Allison Procedure Date: 06/22/2020 8:03 AM MRN: [id number] Account #: [id number] Date of Birth: [birth year] Admit Type: Outpatient Age: 80 Room: [facility] ENDO ROOM 1 Gender: Male Note Status: Finalized Procedure:             Colonoscopy Indications:           Abnormal CT of the GI tract Providers:             Andrey Farmer MD, MD Medicines:             Monitored Anesthesia Care Complications:         No immediate complications. Estimated blood loss:                         Minimal. Procedure:             Pre-Anesthesia Assessment:                        - Prior to the procedure, a History and Physical was                         performed, and patient medications and allergies were                         reviewed. The patient is competent. The risks and                         benefits of the procedure and the sedation options and                         risks were discussed with the patient. All questions                         were answered and informed consent was obtained.                         Patient identification and proposed procedure were                         verified by the physician, the nurse, the anesthetist                         and the technician in the endoscopy suite. Mental                         Status Examination: alert and oriented. Airway                         Examination: normal oropharyngeal airway and neck                         mobility. Respiratory Examination: clear to                         auscultation. CV Examination: normal. Prophylactic                         Antibiotics: The patient does not require prophylactic  antibiotics. Prior Anticoagulants: The patient has                         taken Plavix (clopidogrel), last dose was 5 days prior                         to procedure. ASA Grade Assessment: III - A patient                         with  severe systemic disease. After reviewing the                         risks and benefits, the patient was deemed in                         satisfactory condition to undergo the procedure. The                         anesthesia plan was to use monitored anesthesia care                         (MAC). Immediately prior to administration of                         medications, the patient was re-assessed for adequacy                         to receive sedatives. The heart rate, respiratory                         rate, oxygen saturations, blood pressure, adequacy of                         pulmonary ventilation, and response to care were                         monitored throughout the procedure. The physical                         status of the patient was re-assessed after the                         procedure.                        After obtaining informed consent, the colonoscope was                         passed under direct vision. Throughout the procedure,                         the patient's blood pressure, pulse, and oxygen                         saturations were monitored continuously. The                         Colonoscope was introduced through the anus and  advanced to the the terminal ileum. The colonoscopy                         was technically difficult and complex due to                         significant looping. Successful completion of the                         procedure was aided by changing the patient to a                         supine position. The patient tolerated the procedure                         well. The quality of the bowel preparation was fair. Findings:      The perianal and digital rectal examinations were normal.      The terminal ileum appeared normal. Biopsies were taken with a cold       forceps for histology.      Many small and large-mouthed diverticula were found in the sigmoid       colon, descending colon,  transverse colon, hepatic flexure and ascending       colon.      Two sessile polyps were found in the transverse colon. The polyps were 1       to 2 mm in size. These polyps were removed with a cold snare. Resection       and retrieval were complete. Estimated blood loss was minimal.      A 2 mm polyp was found in the transverse colon. The polyp was sessile.       The polyp was removed with a jumbo cold forceps. Resection and retrieval       were complete. Estimated blood loss was minimal.      Localized mild inflammation characterized by erythema was found in the       ascending colon. Biopsies were taken with a cold forceps for histology.       Estimated blood loss was minimal.      A 2 mm polyp was found in the ascending colon. The polyp was sessile.       The polyp was removed with a jumbo cold forceps. Resection and retrieval       were complete. Estimated blood loss was minimal.      A 4 mm polyp was found in the ascending colon. The polyp was sessile.       The polyp was removed with a cold snare. Resection was complete, but the       polyp tissue was not retrieved. Estimated blood loss was minimal.      A 4 mm polyp was found in the transverse colon. This was actually       located in a diverticulum. The polyp was sessile. The polyp was removed       with a cold snare. Resection was complete, but the polyp tissue was not       retrieved as it fell into a diverticulum. Estimated blood loss was       minimal.      Internal hemorrhoids were found during retroflexion. The hemorrhoids       were Grade I (internal hemorrhoids that do not prolapse).  The exam was otherwise without abnormality on direct and retroflexion       views. Impression:            - Preparation of the colon was fair.                        - The examined portion of the ileum was normal.                         Biopsied. Was able to examine distal 5 cm of TI with                         no obvious lesions.                         - Diverticulosis in the sigmoid colon, in the                         descending colon, in the transverse colon, at the                         hepatic flexure and in the ascending colon.                        - Two 1 to 2 mm polyps in the transverse colon,                         removed with a cold snare. Resected and retrieved.                        - One 2 mm polyp in the transverse colon, removed with                         a jumbo cold forceps. Resected and retrieved.                        - Localized mild inflammation was found in the                         ascending colon. Biopsied.                        - One 2 mm polyp in the ascending colon, removed with                         a jumbo cold forceps. Resected and retrieved.                        - One 4 mm polyp in the ascending colon, removed with                         a cold snare. Complete resection. Polyp tissue not                         retrieved.                        - One 4 mm polyp in the  transverse colon, removed with                         a cold snare. Complete resection. Polyp tissue not                         retrieved.                        - Internal hemorrhoids.                        - The examination was otherwise normal on direct and                         retroflexion views. Recommendation:        - Discharge patient to home.                        - Resume previous diet.                        - Resume Plavix (clopidogrel) at prior dose tomorrow.                        - Await pathology results.                        - Repeat colonoscopy in 1 year because the bowel                         preparation was suboptimal if benefits outweigh risks.                        - Return to referring physician as previously                         scheduled.                        - Perform a computed tomographic (CT scan)                         enterography at appointment to be  scheduled. Andrey Farmer MD, MD 06/22/2020 1:42:48 PM Number of Addenda: 0 Note Initiated On: 06/22/2020 8:03 AM Scope Withdrawal Time: 0 hours 23 minutes 55 seconds  Total Procedure Duration: 0 hours 46 minutes 53 seconds  Estimated Blood Loss:  Estimated blood loss was minimal.      [facility]

## 2020-06-22 NOTE — Anesthesia Preprocedure Evaluation (Signed)
Anesthesia Evaluation  Patient identified by MRN, date of birth, ID band Patient awake    Reviewed: Allergy & Precautions, H&P , NPO status , Patient's Chart, lab work & pertinent test results  History of Anesthesia Complications (+) PONV and history of anesthetic complications  Airway Mallampati: III  TM Distance: <3 FB Neck ROM: limited    Dental  (+) Chipped, Missing   Pulmonary shortness of breath and with exertion, sleep apnea , COPD, former smoker,    Pulmonary exam normal        Cardiovascular hypertension, + CAD, + Past MI, + Peripheral Vascular Disease and +CHF  Normal cardiovascular exam     Neuro/Psych TIA Neuromuscular disease negative psych ROS   GI/Hepatic Neg liver ROS, GERD  Medicated and Controlled,  Endo/Other  diabetes, Type 2Hypothyroidism   Renal/GU negative Renal ROS  negative genitourinary   Musculoskeletal  (+) Arthritis ,   Abdominal   Peds  Hematology negative hematology ROS (+)   Anesthesia Other Findings Past Medical History: No date: Anemia     Comment:  iron deficiency No date: Anxiety No date: Arthritis 2015: Cancer ([place])     Comment:  throat. had surgery and radiation No date: Complication of anesthesia     Comment:  woke up during AAA repair. Had an epidural and does NOT               want again. No date: Coronary artery disease No date: Dyspnea     Comment:  chronic cough since teenage years. No date: GERD (gastroesophageal reflux disease) No date: Gout No date: Hypercholesterolemia No date: Hypertension No date: Hypothyroidism 1993: Myocardial infarction ([place]) 08/11/2019: Obesity (BMI 30.0-34.9) No date: PONV (postoperative nausea and vomiting)     Comment:  Several times, vomitting for hours after surgery. No date: Sleep apnea No date: TIA (transient ischemic attack)  Past Surgical History: 2010: ABDOMINAL AORTIC ANEURYSM REPAIR     Comment:  no longer followed  on a regular basis 2000: BACK SURGERY     Comment:  L4 . piece of vertebrae broke off and lodged into spine.              had to remove the loose piece. No date: CARDIAC CATHETERIZATION 12/12/2016: CATARACT EXTRACTION W/PHACO; Left     Comment:  Procedure: CATARACT EXTRACTION PHACO AND INTRAOCULAR               LENS PLACEMENT (IOC);  Surgeon: Birder Robson, MD;                Location: [facility] ORS;  Service: Ophthalmology;  Laterality:              Left;  [place] 00:38.1 AP% 14.1 CDE 5.38 FLUID PACK LOT #               [id number] 12/26/2016: CATARACT EXTRACTION W/PHACO; Right     Comment:  Procedure: CATARACT EXTRACTION PHACO AND INTRAOCULAR               LENS PLACEMENT (IOC);  Surgeon: Birder Robson, MD;                Location: [facility] ORS;  Service: Ophthalmology;  Laterality:              Right;  [place] 00:53.7 AP% 12.9 CDE 6.92 Fluid Pack Lot #               [id number] 1993: CORONARY ARTERY BYPASS GRAFT No date: EYE SURGERY; Bilateral 2015:  LARYNGOSCOPY     Comment:  tumor removal followed by radiation treatments. No date: TESTICLAR CYST EXCISION 08/21/2019: TOTAL HIP ARTHROPLASTY; Right     Comment:  Procedure: TOTAL HIP ARTHROPLASTY ANTERIOR APPROACH;                Surgeon: Hessie Knows, MD;  Location: [facility] ORS;                Service: Orthopedics;  Laterality: Right;  BMI    Body Mass Index: 33.19 kg/m      Reproductive/Obstetrics negative OB ROS                             Anesthesia Physical Anesthesia Plan  ASA: III  Anesthesia Plan: General   Post-op Pain Management:    Induction: Intravenous  PONV Risk Score and Plan: Propofol infusion and TIVA  Airway Management Planned: Natural Airway and Nasal Cannula  Additional Equipment:   Intra-op Plan:   Post-operative Plan:   Informed Consent: I have reviewed the patients History and Physical, chart, labs and discussed the procedure including the risks, benefits and alternatives for the  proposed anesthesia with the patient or authorized representative who has indicated his/her understanding and acceptance.     Dental Advisory Given  Plan Discussed with: Anesthesiologist, CRNA and Surgeon  Anesthesia Plan Comments: (Patient consented for risks of anesthesia including but not limited to:  - adverse reactions to medications - risk of airway placement if required - damage to eyes, teeth, lips or other oral mucosa - nerve damage due to positioning  - sore throat or hoarseness - Damage to heart, brain, nerves, lungs, other parts of body or loss of life  Patient voiced understanding.)        Anesthesia Quick Evaluation

## 2020-06-22 NOTE — H&P (Signed)
Outpatient short stay form Pre-procedure 06/22/2020 12:39 PM Jesse Miyamoto MD, MPH  Primary Physician: Dr. Caryl Comes  Reason for visit:  Abnormal Imaging  History of present illness:   80 y/o gentleman with history of vascular disease on plavix, AAA repair, and HFrEF here for colonoscopy due to history of SBO with transition point in the distal small bowel. Has been on liquid diet since March 4th. Last colonoscopy was in 2013 with no significant findings. No significant abdominal surgeries. No family history of GI malignancies. Last dose of plavix was 5 days ago.    Current Facility-Administered Medications:  .  0.9 %  sodium chloride infusion, , Intravenous, Continuous, [place]lear, Hilton Cork, MD, Last Rate: 20 mL/hr at 06/22/20 1231, Continued from Pre-op at 06/22/20 1231  Medications Prior to Admission  Medication Sig Dispense Refill Last Dose  . albuterol (VENTOLIN HFA) 108 (90 Base) MCG/ACT inhaler Inhale 1-2 puffs into the lungs every 6 (six) hours as needed for wheezing or shortness of breath.    Past Week at Unknown time  . allopurinol (ZYLOPRIM) 300 MG tablet Take 300 mg by mouth daily.   Past Week at Unknown time  . aspirin EC 81 MG tablet Take 1 tablet (81 mg total) by mouth in the morning and at bedtime. 30 tablet 0 06/21/2020 at 0800  . azelastine (ASTELIN) 0.1 % nasal spray Place 1-2 sprays into both nostrils 2 (two) times daily as needed for allergies.    Past Week at Unknown time  . calcium carbonate (OSCAL) 1500 (600 Ca) MG TABS tablet Take 600 mg of elemental calcium by mouth at bedtime.    06/21/2020 at 0800  . carvedilol (COREG) 25 MG tablet Take 0.5 tablets by mouth 2 (two) times daily.   06/22/2020 at 0600  . clopidogrel (PLAVIX) 75 MG tablet Take 75 mg by mouth daily.   Past Week at Unknown time  . cyanocobalamin 1000 MCG tablet Take 1,000 mcg by mouth daily after lunch.    Past Week at Unknown time  . furosemide (LASIX) 40 MG tablet Take 40 mg by mouth daily.   06/21/2020 at  0800  . Iron, Ferrous Sulfate, 142 (45 Fe) MG TBCR Take 45 mg by mouth daily with lunch.    Past Week at Unknown time  . levothyroxine (SYNTHROID, LEVOTHROID) 50 MCG tablet Take 50 mcg by mouth daily before breakfast.   06/21/2020 at 0800  . losartan (COZAAR) 50 MG tablet Take 50 mg by mouth daily.   06/21/2020 at 0  . lovastatin (MEVACOR) 20 MG tablet Take 20 mg by mouth daily at 6 PM.   06/21/2020 at 0800  . nitroGLYCERIN (NITROSTAT) 0.4 MG SL tablet Place under the tongue.   Past Month at Unknown time  . pantoprazole (PROTONIX) 40 MG tablet Take 40 mg by mouth daily.   06/21/2020 at 0800  . potassium chloride (KLOR-CON) 10 MEQ tablet Take 10 mEq by mouth daily.   06/21/2020 at 0+800  . tamsulosin (FLOMAX) 0.4 MG CAPS capsule Take 0.4 mg by mouth daily. Take after 30 minutes after same meal each day   06/21/2020 at 0800  . colchicine 0.6 MG tablet Take 0.6 mg by mouth daily. (Patient not taking: Reported on 06/22/2020)   Not Taking at Unknown time     Allergies  Allergen Reactions  . Niacin Other (See Comments)    Leg cramps Leg cramps  Leg cramps  . Oxycodone Other (See Comments)    Makes him loopy and out of control.  Does not like the sensation. Makes him loopy and out of control.  Does not like the sensation.  . Prednisone Other (See Comments)    Severe pain D/T stopping prednisone immediately rather than weaning it. Severe pain D/T stopping prednisone immediately rather than weaning it.     Past Medical History:  Diagnosis Date  . Anemia    iron deficiency  . Anxiety   . Arthritis   . Cancer ([place]) 2015   throat. had surgery and radiation  . Complication of anesthesia    woke up during AAA repair. Had an epidural and does NOT want again.  . Coronary artery disease   . Dyspnea    chronic cough since teenage years.  Marland Kitchen GERD (gastroesophageal reflux disease)   . Gout   . Hypercholesterolemia   . Hypertension   . Hypothyroidism   . Myocardial infarction ([place]) 1993  . Obesity  (BMI 30.0-34.9) 08/11/2019  . PONV (postoperative nausea and vomiting)    Several times, vomitting for hours after surgery.  . Sleep apnea   . TIA (transient ischemic attack)     Review of systems:  Otherwise negative.    Physical Exam  Gen: Alert, oriented. Appears stated age.  HEENT: PERRLA. Lungs: No respiratory distress CV: RRR Abd: soft, benign, no masses Ext: No edema    Planned procedures: Proceed with colonoscopy. The patient understands the nature of the planned procedure, indications, risks, alternatives and potential complications including but not limited to bleeding, infection, perforation, damage to internal organs and possible oversedation/side effects from anesthesia. The patient agrees and gives consent to proceed.  Please refer to procedure notes for findings, recommendations and patient disposition/instructions.     Jesse Miyamoto MD, MPH Gastroenterology 06/22/2020  12:39 PM

## 2020-06-23 ENCOUNTER — Encounter: Payer: Self-pay | Admitting: Gastroenterology

## 2020-06-23 LAB — SURGICAL PATHOLOGY

## 2020-06-23 NOTE — Anesthesia Postprocedure Evaluation (Signed)
Anesthesia Post Note  Patient: Jesse Allison  Procedure(s) Performed: COLONOSCOPY WITH PROPOFOL (N/A )  Patient location during evaluation: Endoscopy Anesthesia Type: General Level of consciousness: awake and alert Pain management: pain level controlled Vital Signs Assessment: post-procedure vital signs reviewed and stable Respiratory status: spontaneous breathing, nonlabored ventilation, respiratory function stable and patient connected to nasal cannula oxygen Cardiovascular status: blood pressure returned to baseline and stable Postop Assessment: no apparent nausea or vomiting Anesthetic complications: no   No complications documented.   Last Vitals:  Vitals:   06/22/20 1403 06/22/20 1413  BP: (!) 152/68 (!) 153/89  Pulse:    Resp:    Temp:    SpO2:      Last Pain:  Vitals:   06/23/20 0738  TempSrc:   PainSc: 0-No pain                 [name] [name]

## 2020-06-24 ENCOUNTER — Other Ambulatory Visit: Payer: Self-pay | Admitting: Gastroenterology

## 2020-06-24 DIAGNOSIS — Z8719 Personal history of other diseases of the digestive system: Secondary | ICD-10-CM

## 2020-06-25 ENCOUNTER — Other Ambulatory Visit: Payer: Self-pay

## 2020-06-25 ENCOUNTER — Ambulatory Visit
Admission: RE | Admit: 2020-06-25 | Discharge: 2020-06-25 | Disposition: A | Discharge status: Home / Self Care | Payer: PPO | Source: Ambulatory Visit | Attending: Gastroenterology | Admitting: Gastroenterology

## 2020-06-25 DIAGNOSIS — I700 Atherosclerosis of aorta: Secondary | ICD-10-CM | POA: Diagnosis not present

## 2020-06-25 DIAGNOSIS — K56699 Other intestinal obstruction unspecified as to partial versus complete obstruction: Secondary | ICD-10-CM | POA: Diagnosis not present

## 2020-06-25 DIAGNOSIS — R188 Other ascites: Secondary | ICD-10-CM | POA: Insufficient documentation

## 2020-06-25 DIAGNOSIS — K56609 Unspecified intestinal obstruction, unspecified as to partial versus complete obstruction: Secondary | ICD-10-CM | POA: Diagnosis not present

## 2020-06-25 DIAGNOSIS — K5939 Other megacolon: Secondary | ICD-10-CM | POA: Diagnosis not present

## 2020-06-25 DIAGNOSIS — D1809 Hemangioma of other sites: Secondary | ICD-10-CM | POA: Diagnosis not present

## 2020-06-25 DIAGNOSIS — K6389 Other specified diseases of intestine: Secondary | ICD-10-CM | POA: Diagnosis not present

## 2020-06-25 DIAGNOSIS — Z8719 Personal history of other diseases of the digestive system: Secondary | ICD-10-CM | POA: Insufficient documentation

## 2020-06-25 MED ORDER — IOHEXOL 300 MG/ML  SOLN
125.0000 mL | Freq: Once | INTRAMUSCULAR | Status: AC | PRN
  Administered 2020-06-25: 125 mL via INTRAVENOUS

## 2020-06-30 ENCOUNTER — Ambulatory Visit: Payer: PPO | Admitting: Gastroenterology

## 2020-07-05 ENCOUNTER — Ambulatory Visit: Payer: Self-pay | Admitting: Surgery

## 2020-07-05 DIAGNOSIS — K56609 Unspecified intestinal obstruction, unspecified as to partial versus complete obstruction: Secondary | ICD-10-CM | POA: Diagnosis not present

## 2020-07-05 NOTE — H&P (View-Only) (Signed)
Subjective:   CC: SBO (small bowel obstruction) (CMS-HCC) [K56.609]  HPI:  Jesse Allison is a 80 y.o. male who returns for evaluation of above. Still can only take in liquid diet, any solids cause return of abdominal pain and bloating.  Still able to have some BMs.  Past Medical History:  has a past medical history of Abdominal aortic aneurysm (AAA) without rupture (CMS-HCC) (02/07/2016), Anemia, iron deficiency (03/07/2015), Anxiety, Benign neoplasm of colon, COPD (chronic obstructive pulmonary disease) (CMS-HCC), Coronary atherosclerosis of autologous vein bypass graft, Coronary atherosclerosis of native coronary artery, Diverticulosis, Edema, Essential hypertension, benign, GERD (gastroesophageal reflux disease), Gout, joint, Hemorrhage of rectum and anus, History of blood transfusion (1993), History of cataract (2018), History of myocardial infarction (1993), Laryngeal squamous cell carcinoma (CMS-HCC) (2012), Obesity, Other and unspecified hyperlipidemia, Other symptoms involving digestive system(787.99), Pulmonary hypertension (CMS-HCC), PVD (peripheral vascular disease) (CMS-HCC), Senile purpura (CMS-HCC) (02/08/2016), Sleep apnea, SOB (shortness of breath), Spinal stenosis, Thyroid disease (2017), Type II or unspecified type diabetes mellitus without mention of complication, not stated as uncontrolled (CMS-HCC), and Vitamin B12 deficiency.  Past Surgical History:  has a past surgical history that includes Excision Nasal Polyps; Endovascular Repair Infrarenal Abdominal Aortic Aneurysm (03/26/2007); Excision Spermatocele (09/2009); RESECTION OF LARYNGEAL; colonoscopy (05/22/1997); CABG (09/1991); Back surgery L4 (09/1998); Colonoscopy (01/07/2009, 08/25/2004); Colonoscopy (04/29/2012); Coronary artery bypass graft (1993); Cataract extraction (2018); Arthroplasty Hip Total (Right, 08/21/2019); Colonoscopy (06/22/2020); and Vascular surgery (AAA repair -2008).  Family History: family history  includes Alcohol abuse in his sister; Anxiety in his sister; Bipolar disorder in his sister; Coronary Artery Disease (Blocked arteries around heart) in his father; Deep vein thrombosis (DVT or abnormal blood clot formation) in his daughter; Depression in his sister and sister; High blood pressure (Hypertension) in his father and sister; Stroke in his sister. Family history is unknown by patient.  Social History:  reports that he quit smoking about 10 years ago. His smoking use included cigarettes. He started smoking about 61 years ago. He has a 45.00 pack-year smoking history. He has never used smokeless tobacco. He reports current alcohol use of about 10.0 standard drinks of alcohol per week. He reports that he does not use drugs.  Current Medications: has a current medication list which includes the following prescription(s): albuterol, allopurinol, aspirin, calcium carbonate/vitamin d3, carvedilol, cyanocobalamin, ferrous sulfate, furosemide, levothyroxine, losartan, lovastatin, nitroglycerin, potassium chloride, tamsulosin, and clopidogrel.  Allergies:  Allergies  Allergen Reactions  . Niacin Other (See Comments)    Leg cramps  Leg cramps  . Oxycodone Unknown    Makes him loopy and out of control.  Does not like the sensation.  . Prednisone Unknown and Other (See Comments)    Severe pain D/T stopping prednisone immediately rather than weaning it.    ROS:  A 15 point review of systems was performed and pertinent positives and negatives noted in HPI   Objective:     BP 112/58   Pulse 69   Ht 180.3 cm (5\' 11" )   Wt (!) 110.7 kg (244 lb)   BMI 34.03 kg/m   Constitutional :  alert, appears stated age, cooperative and no distress  Lymphatics/Throat:  no asymmetry, masses, or scars  Respiratory:  clear to auscultation bilaterally  Cardiovascular:  regular rate and rhythm  Gastrointestinal: soft, non-tender; bowel sounds normal; no masses,  no organomegaly.    Musculoskeletal: Steady  gait and movement  Skin: Cool and moist  Psychiatric: Normal affect, non-agitated, not confused       LABS:  n/a RADS: CLINICAL DATA: Colonoscopy on 06/22/2020. History of small-bowel  obstruction and distal small bowel.   EXAM:  CT ABDOMEN AND PELVIS WITH CONTRAST (ENTEROGRAPHY)   TECHNIQUE:  Multidetector CT of the abdomen and pelvis during bolus  administration of intravenous contrast. Negative oral contrast was  given.   CONTRAST: 135mL OMNIPAQUE IOHEXOL 300 MG/ML SOLN   COMPARISON: 06/07/2020.   FINDINGS:  Lower chest: Image quality is degraded by respiratory motion.  Scarring in the lingula and left lowerlobe. Atherosclerotic  calcification of the aorta and aortic valve. Pulmonic trunk and  heart are enlarged. No pericardial or pleural effusion. Distal  esophagus is grossly unremarkable.   Hepatobiliary: Liver and gallbladder are unremarkable. No biliary  ductal dilatation.   Pancreas: Negative.   Spleen: Hyperattenuating lesions in the posterior spleen measure up  to 1.4 cm, as on 06/07/2020 and are indeterminate.   Adrenals/Urinary Tract: Adrenal glands are unremarkable. Scarring in  the kidneys.Low-attenuation lesions in the kidneys measure up to  3.6 cm on the left and are likely cysts. Ureters are decompressed.  Bladder is partially obscured by streak artifact from a right hip  arthroplasty.   Stomach/Bowel: Stomach is unremarkable. There is fluid-filled and  dilated small bowel with a fairly abrupt transition point in the  mid/distal ileum, where there is a slightly thickened segment of  small bowel (2/81-84). Findings appear similar to 06/07/2020.  Remainder of the distal ileum is decompressed. Appendix is not well  visualized. Colon is unremarkable.   Vascular/Lymphatic: Atherosclerotic calcification of the aorta.  Aorto bi-iliac endovascular stent graft. Native aneurysm sac  measures 4.2 cm. No pathologically enlarged lymph nodes.    Reproductive: Prostate is visualized.   Other: Scattered ascites. Mild mesenteric haziness appears similar.  No free air.   Musculoskeletal: Right hip arthroplasty. Degenerative changes in the  spine. Mild changes of avascular necrosis in the left femoral head.   IMPRESSION:  1. Small-bowel obstruction with a transition point in the mid/distal  ileum, where there is a short segment of thickened small bowel.  Findings appear similar to 06/07/2020.  2. Small scattered ascites.  3. Hyperattenuating splenic lesions are indeterminate. In the  absence of known malignancy, hemangiomas could have this appearance.  4. Aortic atherosclerosis (ICD10-I70.0).  5. Enlarged pulmonic trunk, indicative of pulmonary arterial  hypertension.    Electronically Signed  By: Lorin Picket M.D.  On: 06/25/2020 16:11  Procedure Note  Lorin Picket, MD - 06/25/2020  Formatting of this note might be different from the original.  CLINICAL DATA: Colonoscopy on 06/22/2020. History of small-bowel  obstruction and distal small bowel.   EXAM:  CT ABDOMEN AND PELVIS WITH CONTRAST (ENTEROGRAPHY)   TECHNIQUE:  Multidetector CT of the abdomen and pelvis during bolus  administration of intravenous contrast. Negative oral contrast was  given.   CONTRAST: 163mL OMNIPAQUE IOHEXOL 300 MG/ML SOLN   COMPARISON: 06/07/2020.   FINDINGS:  Lower chest: Image quality is degraded by respiratory motion.  Scarring in the lingula and left lower lobe. Atherosclerotic  calcification of the aorta and aortic valve. Pulmonic trunk and  heart are enlarged. No pericardial or pleural effusion. Distal  esophagus is grossly unremarkable.   Hepatobiliary: Liver and gallbladder are unremarkable. No biliary  ductal dilatation.   Pancreas: Negative.   Spleen: Hyperattenuating lesions in the posterior spleen measure up  to 1.4 cm, as on 06/07/2020 and are indeterminate.   Adrenals/Urinary Tract: Adrenal glands  are unremarkable. Scarring in  the kidneys. Low-attenuation lesions in the kidneys  measure up to  3.6 cm on the left and are likely cysts. Ureters are decompressed.  Bladder is partially obscured by streak artifact from a right hip  arthroplasty.   Stomach/Bowel: Stomach is unremarkable. There is fluid-filled and  dilated small bowel with a fairly abrupt transition point in the  mid/distal ileum, where there is a slightly thickened segment of  smallbowel (2/81-84). Findings appear similar to 06/07/2020.  Remainder of the distal ileum is decompressed. Appendix is not well  visualized. Colon is unremarkable.   Vascular/Lymphatic: Atherosclerotic calcification of the aorta.  Aorto bi-iliac endovascular stent graft. Native aneurysm sac  measures 4.2 cm. No pathologically enlarged lymph nodes.   Reproductive: Prostate is visualized.   Other: Scattered ascites. Mild mesenteric haziness appears similar.  No free air.   Musculoskeletal: Right hip arthroplasty. Degenerative changes in the  spine. Mild changes of avascular necrosis in the left femoral head.   IMPRESSION:  1. Small-bowel obstruction with a transition point in the mid/distal  ileum, where there is a short segment of thickened small bowel.  Findings appear similar to 06/07/2020.  2. Small scattered ascites.  3. Hyperattenuating splenic lesions are indeterminate. In the  absence of known malignancy, hemangiomas could have this appearance.  4. Aortic atherosclerosis (ICD10-I70.0).  5. Enlarged pulmonic trunk, indicative of pulmonary arterial  hypertension.    Electronically Signed   By: Lorin Picket M.D.   On: 06/25/2020 16:11   Assessment:      SBO (small bowel obstruction) (CMS-HCC) [K56.609], persistent symptoms despite GI workup and CT proven persistent narrowing of distal ileum.  Recommend surgical resection for symptom resolution at this point. Robotic assisted laparoscopic.  Hx of CAD, on plavix and  aspirin- will ask cards to hold Obesity- increased risk but should proceed at this point due to persistent pain.  Plan:     Discussed pathophisiology of colon CA in depth.  The risk of laparoscopic colon resection surgery includes, but not limited to, recurrence, bleeding, chronic pain, post-op infxn, post-op SBO or ileus, hernias, resection of bowel, re-anastamosis, possible ostomy placement and need for re-operation to address said risks. The risks of general anesthetic, if used, includes MI, CVA, sudden death or even reaction to anesthetic medications also discussed. Alternatives include continued observation.  Benefits include possible symptom relief, preventing further decline in health and possible death.   Typical post-op recovery time of additional days in hospital for observation afterwards also discussed.    Pending medical clearance and workup as noted above.     The patient verbalized understanding and all questions were answered to the patient's satisfaction.

## 2020-07-05 NOTE — H&P (Signed)
Subjective:   CC: SBO (small bowel obstruction) (CMS-HCC) [K56.609]  HPI:  Jesse Allison is a 80 y.o. male who returns for evaluation of above. Still can only take in liquid diet, any solids cause return of abdominal pain and bloating.  Still able to have some BMs.  Past Medical History:  has a past medical history of Abdominal aortic aneurysm (AAA) without rupture (CMS-HCC) (02/07/2016), Anemia, iron deficiency (03/07/2015), Anxiety, Benign neoplasm of colon, COPD (chronic obstructive pulmonary disease) (CMS-HCC), Coronary atherosclerosis of autologous vein bypass graft, Coronary atherosclerosis of native coronary artery, Diverticulosis, Edema, Essential hypertension, benign, GERD (gastroesophageal reflux disease), Gout, joint, Hemorrhage of rectum and anus, History of blood transfusion (1993), History of cataract (2018), History of myocardial infarction (1993), Laryngeal squamous cell carcinoma (CMS-HCC) (2012), Obesity, Other and unspecified hyperlipidemia, Other symptoms involving digestive system(787.99), Pulmonary hypertension (CMS-HCC), PVD (peripheral vascular disease) (CMS-HCC), Senile purpura (CMS-HCC) (02/08/2016), Sleep apnea, SOB (shortness of breath), Spinal stenosis, Thyroid disease (2017), Type II or unspecified type diabetes mellitus without mention of complication, not stated as uncontrolled (CMS-HCC), and Vitamin B12 deficiency.  Past Surgical History:  has a past surgical history that includes Excision Nasal Polyps; Endovascular Repair Infrarenal Abdominal Aortic Aneurysm (03/26/2007); Excision Spermatocele (09/2009); RESECTION OF LARYNGEAL; colonoscopy (05/22/1997); CABG (09/1991); Back surgery L4 (09/1998); Colonoscopy (01/07/2009, 08/25/2004); Colonoscopy (04/29/2012); Coronary artery bypass graft (1993); Cataract extraction (2018); Arthroplasty Hip Total (Right, 08/21/2019); Colonoscopy (06/22/2020); and Vascular surgery (AAA repair -2008).  Family History: family history  includes Alcohol abuse in his sister; Anxiety in his sister; Bipolar disorder in his sister; Coronary Artery Disease (Blocked arteries around heart) in his father; Deep vein thrombosis (DVT or abnormal blood clot formation) in his daughter; Depression in his sister and sister; High blood pressure (Hypertension) in his father and sister; Stroke in his sister. Family history is unknown by patient.  Social History:  reports that he quit smoking about 10 years ago. His smoking use included cigarettes. He started smoking about 61 years ago. He has a 45.00 pack-year smoking history. He has never used smokeless tobacco. He reports current alcohol use of about 10.0 standard drinks of alcohol per week. He reports that he does not use drugs.  Current Medications: has a current medication list which includes the following prescription(s): albuterol, allopurinol, aspirin, calcium carbonate/vitamin d3, carvedilol, cyanocobalamin, ferrous sulfate, furosemide, levothyroxine, losartan, lovastatin, nitroglycerin, potassium chloride, tamsulosin, and clopidogrel.  Allergies:  Allergies  Allergen Reactions  . Niacin Other (See Comments)    Leg cramps  Leg cramps  . Oxycodone Unknown    Makes him loopy and out of control.  Does not like the sensation.  . Prednisone Unknown and Other (See Comments)    Severe pain D/T stopping prednisone immediately rather than weaning it.    ROS:  A 15 point review of systems was performed and pertinent positives and negatives noted in HPI   Objective:     BP 112/58   Pulse 69   Ht 180.3 cm (5\' 11" )   Wt (!) 110.7 kg (244 lb)   BMI 34.03 kg/m   Constitutional :  alert, appears stated age, cooperative and no distress  Lymphatics/Throat:  no asymmetry, masses, or scars  Respiratory:  clear to auscultation bilaterally  Cardiovascular:  regular rate and rhythm  Gastrointestinal: soft, non-tender; bowel sounds normal; no masses,  no organomegaly.    Musculoskeletal: Steady  gait and movement  Skin: Cool and moist  Psychiatric: Normal affect, non-agitated, not confused       LABS:  n/a RADS: CLINICAL DATA: Colonoscopy on 06/22/2020. History of small-bowel  obstruction and distal small bowel.   EXAM:  CT ABDOMEN AND PELVIS WITH CONTRAST (ENTEROGRAPHY)   TECHNIQUE:  Multidetector CT of the abdomen and pelvis during bolus  administration of intravenous contrast. Negative oral contrast was  given.   CONTRAST: 161mL OMNIPAQUE IOHEXOL 300 MG/ML SOLN   COMPARISON: 06/07/2020.   FINDINGS:  Lower chest: Image quality is degraded by respiratory motion.  Scarring in the lingula and left lowerlobe. Atherosclerotic  calcification of the aorta and aortic valve. Pulmonic trunk and  heart are enlarged. No pericardial or pleural effusion. Distal  esophagus is grossly unremarkable.   Hepatobiliary: Liver and gallbladder are unremarkable. No biliary  ductal dilatation.   Pancreas: Negative.   Spleen: Hyperattenuating lesions in the posterior spleen measure up  to 1.4 cm, as on 06/07/2020 and are indeterminate.   Adrenals/Urinary Tract: Adrenal glands are unremarkable. Scarring in  the kidneys.Low-attenuation lesions in the kidneys measure up to  3.6 cm on the left and are likely cysts. Ureters are decompressed.  Bladder is partially obscured by streak artifact from a right hip  arthroplasty.   Stomach/Bowel: Stomach is unremarkable. There is fluid-filled and  dilated small bowel with a fairly abrupt transition point in the  mid/distal ileum, where there is a slightly thickened segment of  small bowel (2/81-84). Findings appear similar to 06/07/2020.  Remainder of the distal ileum is decompressed. Appendix is not well  visualized. Colon is unremarkable.   Vascular/Lymphatic: Atherosclerotic calcification of the aorta.  Aorto bi-iliac endovascular stent graft. Native aneurysm sac  measures 4.2 cm. No pathologically enlarged lymph nodes.    Reproductive: Prostate is visualized.   Other: Scattered ascites. Mild mesenteric haziness appears similar.  No free air.   Musculoskeletal: Right hip arthroplasty. Degenerative changes in the  spine. Mild changes of avascular necrosis in the left femoral head.   IMPRESSION:  1. Small-bowel obstruction with a transition point in the mid/distal  ileum, where there is a short segment of thickened small bowel.  Findings appear similar to 06/07/2020.  2. Small scattered ascites.  3. Hyperattenuating splenic lesions are indeterminate. In the  absence of known malignancy, hemangiomas could have this appearance.  4. Aortic atherosclerosis (ICD10-I70.0).  5. Enlarged pulmonic trunk, indicative of pulmonary arterial  hypertension.    Electronically Signed  By: Lorin Picket M.D.  On: 06/25/2020 16:11  Procedure Note  Lorin Picket, MD - 06/25/2020  Formatting of this note might be different from the original.  CLINICAL DATA: Colonoscopy on 06/22/2020. History of small-bowel  obstruction and distal small bowel.   EXAM:  CT ABDOMEN AND PELVIS WITH CONTRAST (ENTEROGRAPHY)   TECHNIQUE:  Multidetector CT of the abdomen and pelvis during bolus  administration of intravenous contrast. Negative oral contrast was  given.   CONTRAST: 166mL OMNIPAQUE IOHEXOL 300 MG/ML SOLN   COMPARISON: 06/07/2020.   FINDINGS:  Lower chest: Image quality is degraded by respiratory motion.  Scarring in the lingula and left lower lobe. Atherosclerotic  calcification of the aorta and aortic valve. Pulmonic trunk and  heart are enlarged. No pericardial or pleural effusion. Distal  esophagus is grossly unremarkable.   Hepatobiliary: Liver and gallbladder are unremarkable. No biliary  ductal dilatation.   Pancreas: Negative.   Spleen: Hyperattenuating lesions in the posterior spleen measure up  to 1.4 cm, as on 06/07/2020 and are indeterminate.   Adrenals/Urinary Tract: Adrenal glands  are unremarkable. Scarring in  the kidneys. Low-attenuation lesions in the kidneys  measure up to  3.6 cm on the left and are likely cysts. Ureters are decompressed.  Bladder is partially obscured by streak artifact from a right hip  arthroplasty.   Stomach/Bowel: Stomach is unremarkable. There is fluid-filled and  dilated small bowel with a fairly abrupt transition point in the  mid/distal ileum, where there is a slightly thickened segment of  smallbowel (2/81-84). Findings appear similar to 06/07/2020.  Remainder of the distal ileum is decompressed. Appendix is not well  visualized. Colon is unremarkable.   Vascular/Lymphatic: Atherosclerotic calcification of the aorta.  Aorto bi-iliac endovascular stent graft. Native aneurysm sac  measures 4.2 cm. No pathologically enlarged lymph nodes.   Reproductive: Prostate is visualized.   Other: Scattered ascites. Mild mesenteric haziness appears similar.  No free air.   Musculoskeletal: Right hip arthroplasty. Degenerative changes in the  spine. Mild changes of avascular necrosis in the left femoral head.   IMPRESSION:  1. Small-bowel obstruction with a transition point in the mid/distal  ileum, where there is a short segment of thickened small bowel.  Findings appear similar to 06/07/2020.  2. Small scattered ascites.  3. Hyperattenuating splenic lesions are indeterminate. In the  absence of known malignancy, hemangiomas could have this appearance.  4. Aortic atherosclerosis (ICD10-I70.0).  5. Enlarged pulmonic trunk, indicative of pulmonary arterial  hypertension.    Electronically Signed   By: Lorin Picket M.D.   On: 06/25/2020 16:11   Assessment:      SBO (small bowel obstruction) (CMS-HCC) [K56.609], persistent symptoms despite GI workup and CT proven persistent narrowing of distal ileum.  Recommend surgical resection for symptom resolution at this point. Robotic assisted laparoscopic.  Hx of CAD, on plavix and  aspirin- will ask cards to hold Obesity- increased risk but should proceed at this point due to persistent pain.  Plan:     Discussed pathophisiology of colon CA in depth.  The risk of laparoscopic colon resection surgery includes, but not limited to, recurrence, bleeding, chronic pain, post-op infxn, post-op SBO or ileus, hernias, resection of bowel, re-anastamosis, possible ostomy placement and need for re-operation to address said risks. The risks of general anesthetic, if used, includes MI, CVA, sudden death or even reaction to anesthetic medications also discussed. Alternatives include continued observation.  Benefits include possible symptom relief, preventing further decline in health and possible death.   Typical post-op recovery time of additional days in hospital for observation afterwards also discussed.    Pending medical clearance and workup as noted above.     The patient verbalized understanding and all questions were answered to the patient's satisfaction.

## 2020-07-08 ENCOUNTER — Encounter
Admission: RE | Admit: 2020-07-08 | Discharge: 2020-07-08 | Disposition: A | Discharge status: Home / Self Care | Payer: PPO | Source: Ambulatory Visit | Attending: Surgery | Admitting: Surgery

## 2020-07-08 ENCOUNTER — Other Ambulatory Visit: Payer: Self-pay

## 2020-07-08 NOTE — Patient Instructions (Signed)
Your procedure is scheduled on: Thursday 07/15/20.  Report to THE FIRST FLOOR REGISTRATION DESK IN THE MEDICAL MALL ON THE MORNING OF SURGERY FIRST, THEN YOU WILL CHECK IN AT THE SURGERY INFORMATION DESK LOCATED OUTSIDE THE SAME DAY SURGERY DEPARTMENT LOCATED ON 2ND FLOOR MEDICAL MALL ENTRANCE.  To find out your arrival time please call [phone] between 1PM - 3PM on Wednesday 07/14/20.   Remember: Instructions that are not followed completely may result in serious medical risk, up to and including death, or upon the discretion of your surgeon and anesthesiologist your surgery may need to be rescheduled.    __X__ 1. Follow bowel prep instructions per Dr. Lysle Pearl. Do not drink any clear liquids 2 hours prior to your scheduled arrival time.  __X__2.  On the morning of surgery brush your teeth with toothpaste and water, you may rinse your mouth with mouthwash if you wish.  Do not swallow any toothpaste or mouthwash.    __X__ 3.  No Alcohol for 24 hours before or after surgery.  __X__ 4.  Do Not Smoke or use e-cigarettes For 24 Hours Prior to Your Surgery.                 Do not use any chewable tobacco products for at least 6 hours prior to                 surgery.  __X__5.  Notify your doctor if there is any change in your medical condition      (cold, fever, infections).      Do NOT wear jewelry, make-up, hairpins, clips or nail polish. Do NOT wear lotions, powders, or perfumes.  Do NOT shave 48 hours prior to surgery. Men may shave face and neck. Do NOT bring valuables to the hospital.     [facility] is not responsible for any belongings or valuables.   Contacts, dentures/partials or body piercings may not be worn into surgery. Bring a case for your contacts, glasses or hearing aids, a denture cup will be supplied.  Leave your suitcase in the car. After surgery it may be brought to your room.   For patients admitted to the hospital, discharge time is determined by your treatment  team.     __X__ Take these medicines the morning of surgery with A SIP OF WATER:     1. albuterol (VENTOLIN HFA)   2. carvedilol (COREG)   3. levothyroxine (SYNTHROID, LEVOTHROID)   4. pantoprazole (PROTONIX)      __X__ Use CHG Soap as directed.   __X__ Use inhalers on the day of surgery. Also bring the inhaler with you to the hospital on the morning of surgery.  __X__ Stop Blood Thinners: Plavix & Aspirin.  Your last dose will be on Friday 07/09/20.  __X__ Stop Anti-inflammatories 7 days before surgery such as Advil, Ibuprofen, Motrin, BC or Goodies Powder, Naprosyn, Naproxen, Aleve, Aspirin, Meloxicam. May take Tylenol if needed for pain or discomfort.   __X__Do not start taking any new herbal supplements or vitamins prior to your procedure.     Wear comfortable clothing (specific to your surgery type) to the hospital.  Plan for stool softeners for home use; pain medications have a tendency to cause constipation. You can also help prevent constipation by eating foods high in fiber such as fruits and vegetables and drinking plenty of fluids as your diet allows.  After surgery, you can prevent lung complications by doing breathing exercises.Take deep breaths and cough every 1-2 hours.  Your doctor may order a device called an Incentive Spirometer to help you take deep breaths.  Please call the [place] Department at [phone] if you have any questions about these instructions.

## 2020-07-09 ENCOUNTER — Encounter: Payer: Self-pay | Admitting: Surgery

## 2020-07-09 NOTE — Progress Notes (Addendum)
Perioperative Services  Pre-Admission/Anesthesia Testing Clinical Review  Date: 07/09/20  Patient Demographics:  Name: Jesse Allison DOB:   1940/12/21 MRN:   [id number]  Planned Surgical Procedure(s):    Case: [id number] Date/Time: 07/15/20 1228   Procedure: XI ROBOTIC ASSISTED SMALL BOWEL RESECTION (N/A Abdomen) - 210 MINS   Anesthesia type: General   Pre-op diagnosis: K27.062 SBO   Location: [facility] OR ROOM 04 / [place] ORS FOR ANESTHESIA GROUP   Surgeons: Benjamine Sprague, DO    NOTE: Available PAT nursing documentation and vital signs have been reviewed. Clinical nursing staff has updated patient's PMH/PSHx, current medication list, and drug allergies/intolerances to ensure comprehensive history available to assist in medical decision making as it pertains to the aforementioned surgical procedure and anticipated anesthetic course.   Clinical Discussion:  Jesse Allison is a 80 y.o. male who is submitted for pre-surgical anesthesia review and clearance prior to him undergoing the above procedure.Patient is a Former Smoker (quit 04/2008). Pertinent PMH includes: CAD (s/p CABG), MI (1993), AAA (s/p EVAR), aortic atherosclerosis, PVD, TIA, HTN, HLD, hypothyroidism, OSAH (requires nocturnal PAP therapy), COPD, GERD (on daily PPI), anemia, laryngeal carcinoma (s/p XRT), anemia, OA, spinal stenosis.  Patient is followed by cardiology Clayborn Bigness, MD). He was last seen in the cardiology clinic on 07/22/2019; notes reviewed.  At the time of his clinic visit, patient noted to be doing well overall from a cardiovascular perspective. He denied chest pain, shortness of breath, orthopnea, PND, palpitations, peripheral edema, vertiginous symptoms, and presyncope/syncope.  Patient with significant cardiovascular history as follows:   Patient suffered MI.  Diagnostic left heart catheterization performed in 08/1991 revealed significant three-vessel CAD (1% stenosis of the proximal RCA, 75% stenosis of the OM1, and  100% stenosis of the mid LAD).   Patient underwent a three-vessel CABG in 09/1991; LIMA to LAD, SVG2 to dRCA, and SVG to OM2.    Patient with known AAA that measured 2.2 cm on 02/26/2006.  Aneurysmal dilatation increased to 4.35 cm a year later ultrasound imaging revealed partial intraluminal obstruction at the mid and distal aorta with characteristics suggestive of a thrombus.  Patient ultimately underwent EVAR procedure in 02/2007.   Aortic duplex performed on 01/11/2016 revealed persistent aortic dilatation measuring 4.5 cm (previously 4.3 cm on 12/01/2013).  Patient chronically on DAPT therapy (ASA plus clopidogrel); compliant with therapy with no evidence of GI bleeding.  Patient on GDMT for his HTN and HLD diagnoses.  Blood pressure well controlled at 128/78 on currently prescribed beta-blocker, diuretic, and ARB therapies.  Patient is on a statin for his HLD. T2DM well controlled on currently prescribed regimen; Hgb A1c 5.7% % when last checked on 06/01/2019. Functional capacity, as defined by DASI, is documented as being >/= 4 METS.  No changes were made to patient's medication regimen.  Of note, patient was being seen in preoperative consult at that time.  And efforts to further restratify patient, the decision was made to obtain repeat functional studies, both of which were performed in 07/2019.  Myocardial perfusion imaging study performed on 07/25/2019 revealed moderately reduced left ventricular function with an EF of 30-44%.  There was evidence of a medium defect of moderate severity present in the basal anterior and apex location consistent with prior myocardial infarction.  TTE performed on 07/28/2019 revealed a moderately reduced left ventricular function with an EF of 35-40%.  There was severe akinesis noted.  Left ventricular cavity size mildly dilated.  There was mild to moderate valvular insufficiency noted (see full  interpretation of cardiovascular testing below). Patient to follow-up  with outpatient cardiology in 12 months or sooner if needed.  Patient is scheduled to undergo small bowel resection on 07/15/2020 with Dr. Benjamine Sprague.  Given patient's past medical history significant for cardiovascular disease and intervention, presurgical cardiac clearance was sought by the performing surgeon's office and PAT team. Per cardiology, "this patient is optimized for surgery and may proceed with the planned procedural course with a MODERATE risk stratification". This patient is on daily DAPT therapy. He has been instructed on recommendations for holding his ASA and clopidogrel for 5 days prior to his procedure with plans to restart 2 days postoperatively, as soon as postoperative bleeding risk felt to be minimized by his attending surgeon. The patient has been instructed that his last dose of his anticoagulant will be on 07/09/2020.  This patient reports perioperative anesthetic complications in the past. Patient has a history of (+) PONV following several of his past surgical procedures; states that he vomits for hours postoperatively.  Additionally, patient reporting premature emergence during his AAA repair.  Patient had a neuraxial anesthetic course at that time and does not wish to have this again.  Vitals with BMI 07/08/2020 06/22/2020 06/22/2020  Height - - -  Weight 231 lbs - -  BMI 20.94 - -  Systolic - 709 628  Diastolic - 89 68  Pulse - - -    Providers/Specialists:   NOTE: Primary physician provider listed below. Patient may have been seen by APP or partner within same practice.   PROVIDER ROLE / SPECIALTY LAST Shirline Frees, DO General Surgery  07/05/2020  Adin Hector, MD Primary Care Provider  06/04/2020  Katrine Coho, MD Cardiology  07/22/2019   Allergies:  Niacin, Oxycodone, and Prednisone  Current Home Medications:   No current facility-administered medications for this encounter.   Marland Kitchen acetaminophen (TYLENOL) 500 MG tablet  . albuterol (VENTOLIN  HFA) 108 (90 Base) MCG/ACT inhaler  . allopurinol (ZYLOPRIM) 300 MG tablet  . aspirin EC 81 MG tablet  . azelastine (ASTELIN) 0.1 % nasal spray  . bismuth subsalicylate (PEPTO BISMOL) 262 MG/15ML suspension  . calcium carbonate (OSCAL) 1500 (600 Ca) MG TABS tablet  . Carboxymethylcellulose Sodium (LUBRICANT EYE DROPS OP)  . carvedilol (COREG) 25 MG tablet  . clopidogrel (PLAVIX) 75 MG tablet  . cyanocobalamin 1000 MCG tablet  . dicyclomine (BENTYL) 10 MG capsule  . furosemide (LASIX) 40 MG tablet  . Iron, Ferrous Sulfate, 142 (45 Fe) MG TBCR  . levothyroxine (SYNTHROID, LEVOTHROID) 50 MCG tablet  . losartan (COZAAR) 50 MG tablet  . lovastatin (MEVACOR) 20 MG tablet  . nitroGLYCERIN (NITROSTAT) 0.4 MG SL tablet  . pantoprazole (PROTONIX) 40 MG tablet  . potassium chloride (KLOR-CON) 10 MEQ tablet  . tamsulosin (FLOMAX) 0.4 MG CAPS capsule   History:   Past Medical History:  Diagnosis Date  . AAA (abdominal aortic aneurysm) ([place])    s/p EVAR in 2008  . Anemia    iron deficiency  . Anxiety   . Aortic atherosclerosis ([place])   . Arthritis   . Chronic cough    since teeanage years  . Complication of anesthesia    woke up during AAA repair. Had an epidural and does NOT want again.  Marland Kitchen COPD (chronic obstructive pulmonary disease) ([place])   . Coronary artery disease   . Diverticulosis   . Dyspnea   . GERD (gastroesophageal reflux disease)   . Gout   . Hx  of CABG 09/1991  . Hx of laryngeal cancer 2015   s/p surgery and XRT  . Hypercholesterolemia   . Hypertension   . Hypothyroidism   . Myocardial infarction ([place]) 1993  . Obesity (BMI 30.0-34.9) 08/11/2019  . PONV (postoperative nausea and vomiting)    Several times, vomitting for hours after surgery.  Marland Kitchen PVD (peripheral vascular disease) ([place])   . Sleep apnea   . Spinal stenosis   . TIA (transient ischemic attack)    Past Surgical History:  Procedure Laterality Date  . ABDOMINAL AORTIC ANEURYSM REPAIR  2010   no longer  followed on a regular basis  . BACK SURGERY  2000   L4 . piece of vertebrae broke off and lodged into spine. had to remove the loose piece.  Marland Kitchen CARDIAC CATHETERIZATION    . CATARACT EXTRACTION W/PHACO Left 12/12/2016   Procedure: CATARACT EXTRACTION PHACO AND INTRAOCULAR LENS PLACEMENT (IOC);  Surgeon: Birder Robson, MD;  Location: [facility] ORS;  Service: Ophthalmology;  Laterality: Left;  [place] 00:38.1 AP% 14.1 CDE 5.38 FLUID PACK LOT # [id number]  . CATARACT EXTRACTION W/PHACO Right 12/26/2016   Procedure: CATARACT EXTRACTION PHACO AND INTRAOCULAR LENS PLACEMENT (IOC);  Surgeon: Birder Robson, MD;  Location: [facility] ORS;  Service: Ophthalmology;  Laterality: Right;  [place] 00:53.7 AP% 12.9 CDE 6.92 Fluid Pack Lot # [id number]  . COLONOSCOPY WITH PROPOFOL N/A 06/22/2020   Procedure: COLONOSCOPY WITH PROPOFOL;  Surgeon: Lesly Rubenstein, MD;  Location: [facility] ENDOSCOPY;  Service: Endoscopy;  Laterality: N/A;  . CORONARY ARTERY BYPASS GRAFT  1993  . EYE SURGERY Bilateral   . LARYNGOSCOPY  2015   tumor removal followed by radiation treatments.  . TESTICLAR CYST EXCISION    . TOTAL HIP ARTHROPLASTY Right 08/21/2019   Procedure: TOTAL HIP ARTHROPLASTY ANTERIOR APPROACH;  Surgeon: Hessie Knows, MD;  Location: [facility] ORS;  Service: Orthopedics;  Laterality: Right;   Family History  Problem Relation Age of Onset  . Leukemia Mother   . Heart attack Father   . Stroke Sister    Social History   Tobacco Use  . Smoking status: Former Smoker    Types: Cigarettes    Quit date: 2010    Years since quitting: 12.2  . Smokeless tobacco: Never Used  Vaping Use  . Vaping Use: Never used  Substance Use Topics  . Alcohol use: Yes    Comment: couple of glasses of wine per day.  . Drug use: No    Pertinent Clinical Results:  LABS: Labs reviewed: Acceptable for surgery.  Lab Results  Component Value Date   WBC 5.7 06/08/2020   HGB 11.7 (L) 06/08/2020   HCT 36.0 (L) 06/08/2020   MCV 90.5 06/08/2020    PLT 187 06/08/2020   Lab Results  Component Value Date   NA 136 06/08/2020   K 3.6 06/08/2020   CO2 26 06/08/2020   GLUCOSE 82 06/08/2020   BUN 17 06/08/2020   CREATININE 0.85 06/08/2020   CALCIUM 8.4 (L) 06/08/2020   GFRNONAA >60 06/08/2020   GFRAA >60 08/23/2019    ECG: Date: 06/07/2020 Time ECG obtained: 2314 PM Rate: 71 bpm Rhythm: normal sinus Intervals: QRS 100 ms. QTc 436 ms. ST segment and T wave changes: No evidence of acute ST segment elevation or depression; evidence of age undetermined septal and inferior infarct noted.   IMAGING / PROCEDURES: ECHOCARDIOGRAM performed on 07/28/2019 1. Left ventricular ejection fraction, by estimation, is 35 to 40%.  2. The left ventricle has moderately decreased function.  3. The left ventricle demonstrates regional wall motion abnormalities. The left ventricular internal cavity size was mildly dilated.  4. Left ventricular diastolic parameters were normal. 5. Right ventricular systolic function is normal.  6. The right ventricular size is normal.  7. There is normal pulmonary artery systolic pressure.  8. Left atrial size was mildly dilated.  9. Right atrial size was mildly dilated. 10. The mitral valve is normal in structure. Mild to moderate mitral valve regurgitation.  11. The aortic valve is normal in structure. Aortic valve regurgitation is not visualized.   LEXISCAN performed in 07/25/2019 1. Moderately reduced left ventricular systolic function with an EF of 35-40% 2. Medium defect of moderate severity present in the basal anterior and apex location consistent with prior MI 3. Blood pressure demonstrated normal response to exercise 4. There was no evidence of stress-induced arrhythmia 5. Intermediate risk study 6. Recommend conservative medical therapy  CT ABDOMEN PELVIS WITH CONTRAST performed on 06/25/2020 1. Small-bowel obstruction with a transition point in the mid/distal ileum, where there is a short segment of  thickened small bowel. Findings appear similar to 06/07/2020. 2. Small scattered ascites. 3. Hyperattenuating splenic lesions are indeterminate. In the absence of known malignancy, hemangiomas could have this appearance. 4. Aortic atherosclerosis 5. Enlarged pulmonic trunk, indicative of pulmonary arterial hypertension.  DIAGNOSTIC RADIOGRAPHS ABDOMEN 2 VIEWS performed on 06/11/2020 1. An endovascular stent is seen within the abdominal aorta 2. Dilated loops of bowel are noted; primarily small bowel 3. Small bowel is dilated out of proportion.   4. There is some gas in the colon 5. Findings consistent with a persistent small bowel obstruction that is better evaluated on CT scan from June 07, 2020.  PVL AORTA DUPLEX performed on 01/11/2016 1. Aneurysmal dilatation visualized abdominal aorta 2. Largest aortic diameter measurement is 4.5 cm 3. Consider consultation with vascular specialist  CORONARY ARTERY BYPASS GRAFTING performed in 09/1991 1. Three-vessel CABG  LIMA to LAD  SVG to OM2  SVG2 to distal RCA  LEFT HEART CATHETERIZATION AND CORONARY ANGIOGRAPHY performed on 09/02/1991 1. LVEF 32% 2. Significant three-vessel CAD  100% stenosis of the proximal RCA  50% stenosis of the ostium of the LM  25% and 75% stenotic lesions noted in the LCx  100% stenosis noted in the mid LAD  25% stenosis in the D1  Impression and Plan:  Jesse Allison has been referred for pre-anesthesia review and clearance prior to him undergoing the planned anesthetic and procedural courses. Available labs, pertinent testing, and imaging results were personally reviewed by me. This patient has been appropriately cleared by cardiology with an overall MODERATE risk of significant perioperative cardiovascular complications.  Based on clinical review performed today (07/09/20), barring any significant acute changes in the patient's overall condition, it is anticipated that he will be able to proceed  with the planned surgical intervention. Any acute changes in clinical condition may necessitate his procedure being postponed and/or cancelled. Pre-surgical instructions were reviewed with the patient during his PAT appointment and questions were fielded by PAT clinical staff.  Honor Loh, MSN, APRN, FNP-C, CEN [facility]  Peri-operative Services Nurse Practitioner Phone: [phone] 07/09/20 10:29 AM  NOTE: This note has been prepared using Dragon dictation software. Despite my best ability to proofread, there is always the potential that unintentional transcriptional errors may still occur from this process.

## 2020-07-12 ENCOUNTER — Other Ambulatory Visit: Payer: PPO

## 2020-07-13 ENCOUNTER — Other Ambulatory Visit: Payer: Self-pay

## 2020-07-13 ENCOUNTER — Encounter
Admission: RE | Admit: 2020-07-13 | Discharge: 2020-07-13 | Disposition: A | Discharge status: Home / Self Care | Payer: PPO | Source: Ambulatory Visit | Attending: Surgery | Admitting: Surgery

## 2020-07-13 DIAGNOSIS — Z01812 Encounter for preprocedural laboratory examination: Secondary | ICD-10-CM | POA: Insufficient documentation

## 2020-07-13 DIAGNOSIS — Z20822 Contact with and (suspected) exposure to covid-19: Secondary | ICD-10-CM | POA: Insufficient documentation

## 2020-07-13 LAB — TYPE AND SCREEN
ABO/RH(D): O POS
Antibody Screen: NEGATIVE

## 2020-07-13 LAB — SARS CORONAVIRUS 2 (TAT 6-24 HRS): SARS Coronavirus 2: NEGATIVE

## 2020-07-15 ENCOUNTER — Encounter
Admission: RE | Disposition: A | Discharge status: Home / Self Care | Payer: Self-pay | Source: Home / Self Care | Attending: Surgery

## 2020-07-15 ENCOUNTER — Other Ambulatory Visit: Payer: Self-pay

## 2020-07-15 ENCOUNTER — Inpatient Hospital Stay: Payer: PPO | Admitting: Urgent Care

## 2020-07-15 ENCOUNTER — Ambulatory Visit
Admission: RE | Admit: 2020-07-15 | Discharge: 2020-07-15 | Disposition: A | Discharge status: Home / Self Care | Payer: PPO | Attending: Surgery | Admitting: Surgery

## 2020-07-15 ENCOUNTER — Encounter: Payer: Self-pay | Admitting: Surgery

## 2020-07-15 DIAGNOSIS — I2510 Atherosclerotic heart disease of native coronary artery without angina pectoris: Secondary | ICD-10-CM | POA: Diagnosis not present

## 2020-07-15 DIAGNOSIS — Z79899 Other long term (current) drug therapy: Secondary | ICD-10-CM | POA: Insufficient documentation

## 2020-07-15 DIAGNOSIS — Z8249 Family history of ischemic heart disease and other diseases of the circulatory system: Secondary | ICD-10-CM | POA: Insufficient documentation

## 2020-07-15 DIAGNOSIS — K660 Peritoneal adhesions (postprocedural) (postinfection): Secondary | ICD-10-CM | POA: Diagnosis not present

## 2020-07-15 DIAGNOSIS — Z951 Presence of aortocoronary bypass graft: Secondary | ICD-10-CM | POA: Insufficient documentation

## 2020-07-15 DIAGNOSIS — Z823 Family history of stroke: Secondary | ICD-10-CM | POA: Insufficient documentation

## 2020-07-15 DIAGNOSIS — K219 Gastro-esophageal reflux disease without esophagitis: Secondary | ICD-10-CM | POA: Insufficient documentation

## 2020-07-15 DIAGNOSIS — Z8521 Personal history of malignant neoplasm of larynx: Secondary | ICD-10-CM | POA: Insufficient documentation

## 2020-07-15 DIAGNOSIS — Z7951 Long term (current) use of inhaled steroids: Secondary | ICD-10-CM | POA: Diagnosis not present

## 2020-07-15 DIAGNOSIS — I509 Heart failure, unspecified: Secondary | ICD-10-CM | POA: Diagnosis not present

## 2020-07-15 DIAGNOSIS — J449 Chronic obstructive pulmonary disease, unspecified: Secondary | ICD-10-CM | POA: Insufficient documentation

## 2020-07-15 DIAGNOSIS — I739 Peripheral vascular disease, unspecified: Secondary | ICD-10-CM | POA: Insufficient documentation

## 2020-07-15 DIAGNOSIS — Z7902 Long term (current) use of antithrombotics/antiplatelets: Secondary | ICD-10-CM | POA: Insufficient documentation

## 2020-07-15 DIAGNOSIS — I252 Old myocardial infarction: Secondary | ICD-10-CM | POA: Diagnosis not present

## 2020-07-15 DIAGNOSIS — I272 Pulmonary hypertension, unspecified: Secondary | ICD-10-CM | POA: Insufficient documentation

## 2020-07-15 DIAGNOSIS — Z87891 Personal history of nicotine dependence: Secondary | ICD-10-CM | POA: Insufficient documentation

## 2020-07-15 DIAGNOSIS — I110 Hypertensive heart disease with heart failure: Secondary | ICD-10-CM | POA: Insufficient documentation

## 2020-07-15 DIAGNOSIS — Z818 Family history of other mental and behavioral disorders: Secondary | ICD-10-CM | POA: Diagnosis not present

## 2020-07-15 DIAGNOSIS — E119 Type 2 diabetes mellitus without complications: Secondary | ICD-10-CM | POA: Diagnosis not present

## 2020-07-15 DIAGNOSIS — I5022 Chronic systolic (congestive) heart failure: Secondary | ICD-10-CM | POA: Diagnosis not present

## 2020-07-15 DIAGNOSIS — Z888 Allergy status to other drugs, medicaments and biological substances status: Secondary | ICD-10-CM | POA: Insufficient documentation

## 2020-07-15 DIAGNOSIS — K56609 Unspecified intestinal obstruction, unspecified as to partial versus complete obstruction: Secondary | ICD-10-CM | POA: Diagnosis not present

## 2020-07-15 DIAGNOSIS — Z885 Allergy status to narcotic agent status: Secondary | ICD-10-CM | POA: Diagnosis not present

## 2020-07-15 DIAGNOSIS — E785 Hyperlipidemia, unspecified: Secondary | ICD-10-CM | POA: Diagnosis not present

## 2020-07-15 DIAGNOSIS — K56699 Other intestinal obstruction unspecified as to partial versus complete obstruction: Secondary | ICD-10-CM | POA: Diagnosis not present

## 2020-07-15 DIAGNOSIS — Z7982 Long term (current) use of aspirin: Secondary | ICD-10-CM | POA: Diagnosis not present

## 2020-07-15 DIAGNOSIS — E782 Mixed hyperlipidemia: Secondary | ICD-10-CM | POA: Diagnosis not present

## 2020-07-15 HISTORY — DX: Abdominal aortic aneurysm, without rupture: I71.4

## 2020-07-15 HISTORY — DX: Atherosclerosis of aorta: I70.0

## 2020-07-15 HISTORY — DX: Chronic cough: R05.3

## 2020-07-15 HISTORY — DX: Diverticulosis of intestine, part unspecified, without perforation or abscess without bleeding: K57.90

## 2020-07-15 HISTORY — DX: Chronic obstructive pulmonary disease, unspecified: J44.9

## 2020-07-15 HISTORY — PX: ROBOTIC ASSISTED LAPAROSCOPIC LYSIS OF ADHESION: SHX6080

## 2020-07-15 HISTORY — DX: Spinal stenosis, site unspecified: M48.00

## 2020-07-15 HISTORY — DX: Abdominal aortic aneurysm, without rupture, unspecified: I71.40

## 2020-07-15 HISTORY — DX: Peripheral vascular disease, unspecified: I73.9

## 2020-07-15 SURGERY — LYSIS, ADHESIONS, ROBOT-ASSISTED, LAPAROSCOPIC
Anesthesia: General | Site: Abdomen

## 2020-07-15 MED ORDER — ORAL CARE MOUTH RINSE: 15.0000 mL | Freq: Once | OROMUCOSAL | Status: AC

## 2020-07-15 MED ORDER — SODIUM CHLORIDE 0.9 % IV SOLN
INTRAVENOUS | Status: DC | PRN
  Administered 2020-07-15: 40 ug/min via INTRAVENOUS

## 2020-07-15 MED ORDER — TRAMADOL HCL 50 MG PO TABS: 50.0000 mg | ORAL_TABLET | Freq: Four times a day (QID) | ORAL | 0 refills | Status: DC | PRN

## 2020-07-15 MED ORDER — CHLORHEXIDINE GLUCONATE CLOTH 2 % EX PADS: 6.0000 | MEDICATED_PAD | Freq: Once | CUTANEOUS | Status: DC

## 2020-07-15 MED ORDER — LIDOCAINE HCL (CARDIAC) PF 100 MG/5ML IV SOSY
PREFILLED_SYRINGE | INTRAVENOUS | Status: DC | PRN
  Administered 2020-07-15: 100 mg via INTRAVENOUS

## 2020-07-15 MED ORDER — GLYCOPYRROLATE 0.2 MG/ML IJ SOLN
INTRAMUSCULAR | Status: DC | PRN
  Administered 2020-07-15: .2 mg via INTRAVENOUS

## 2020-07-15 MED ORDER — CELECOXIB 200 MG PO CAPS: 200.0000 mg | ORAL_CAPSULE | ORAL | Status: AC

## 2020-07-15 MED ORDER — BUPIVACAINE HCL (PF) 0.5 % IJ SOLN
INTRAMUSCULAR | Status: DC | PRN
  Administered 2020-07-15: 13 mL

## 2020-07-15 MED ORDER — ROCURONIUM BROMIDE 10 MG/ML (PF) SYRINGE
PREFILLED_SYRINGE | INTRAVENOUS | Status: AC
  Filled 2020-07-15: qty 10

## 2020-07-15 MED ORDER — PROPOFOL 10 MG/ML IV BOLUS
INTRAVENOUS | Status: DC | PRN
  Administered 2020-07-15: 120 mg via INTRAVENOUS

## 2020-07-15 MED ORDER — ACETAMINOPHEN 500 MG PO TABS
ORAL_TABLET | ORAL | Status: AC
  Administered 2020-07-15: 1000 mg via ORAL
  Filled 2020-07-15: qty 2

## 2020-07-15 MED ORDER — CEFAZOLIN SODIUM-DEXTROSE 2-4 GM/100ML-% IV SOLN
INTRAVENOUS | Status: AC
  Filled 2020-07-15: qty 100

## 2020-07-15 MED ORDER — CELECOXIB 200 MG PO CAPS
ORAL_CAPSULE | ORAL | Status: AC
  Administered 2020-07-15: 200 mg via ORAL
  Filled 2020-07-15: qty 1

## 2020-07-15 MED ORDER — GLYCOPYRROLATE 0.2 MG/ML IJ SOLN
INTRAMUSCULAR | Status: AC
  Filled 2020-07-15: qty 1

## 2020-07-15 MED ORDER — SUGAMMADEX SODIUM 500 MG/5ML IV SOLN
INTRAVENOUS | Status: DC | PRN
  Administered 2020-07-15: 400 mg via INTRAVENOUS

## 2020-07-15 MED ORDER — FENTANYL CITRATE (PF) 100 MCG/2ML IJ SOLN
25.0000 ug | INTRAMUSCULAR | Status: DC | PRN
  Administered 2020-07-15: 25 ug via INTRAVENOUS

## 2020-07-15 MED ORDER — FENTANYL CITRATE (PF) 100 MCG/2ML IJ SOLN
INTRAMUSCULAR | Status: AC
  Filled 2020-07-15: qty 2

## 2020-07-15 MED ORDER — FENTANYL CITRATE (PF) 100 MCG/2ML IJ SOLN
INTRAMUSCULAR | Status: DC | PRN
  Administered 2020-07-15 (×2): 50 ug via INTRAVENOUS

## 2020-07-15 MED ORDER — CHLORHEXIDINE GLUCONATE 0.12 % MT SOLN: 15.0000 mL | Freq: Once | OROMUCOSAL | Status: AC

## 2020-07-15 MED ORDER — CEFAZOLIN SODIUM-DEXTROSE 2-4 GM/100ML-% IV SOLN
2.0000 g | INTRAVENOUS | Status: AC
  Administered 2020-07-15: 2 g via INTRAVENOUS

## 2020-07-15 MED ORDER — DEXAMETHASONE SODIUM PHOSPHATE 10 MG/ML IJ SOLN
INTRAMUSCULAR | Status: AC
  Filled 2020-07-15: qty 1

## 2020-07-15 MED ORDER — ONDANSETRON HCL 4 MG/2ML IJ SOLN
INTRAMUSCULAR | Status: AC
  Filled 2020-07-15: qty 2

## 2020-07-15 MED ORDER — EPHEDRINE SULFATE 50 MG/ML IJ SOLN
INTRAMUSCULAR | Status: DC | PRN
  Administered 2020-07-15: 10 mg via INTRAVENOUS
  Administered 2020-07-15: 7.5 mg via INTRAVENOUS

## 2020-07-15 MED ORDER — LACTATED RINGERS IV SOLN: INTRAVENOUS | Status: DC

## 2020-07-15 MED ORDER — LIDOCAINE HCL (PF) 2 % IJ SOLN
INTRAMUSCULAR | Status: AC
  Filled 2020-07-15: qty 5

## 2020-07-15 MED ORDER — PHENYLEPHRINE HCL (PRESSORS) 10 MG/ML IV SOLN
INTRAVENOUS | Status: DC | PRN
  Administered 2020-07-15 (×3): 200 ug via INTRAVENOUS
  Administered 2020-07-15 (×2): 100 ug via INTRAVENOUS

## 2020-07-15 MED ORDER — ONDANSETRON HCL 4 MG/2ML IJ SOLN
INTRAMUSCULAR | Status: DC | PRN
  Administered 2020-07-15: 4 mg via INTRAVENOUS

## 2020-07-15 MED ORDER — PROPOFOL 10 MG/ML IV BOLUS
INTRAVENOUS | Status: AC
  Filled 2020-07-15: qty 20

## 2020-07-15 MED ORDER — BUPIVACAINE HCL (PF) 0.5 % IJ SOLN
INTRAMUSCULAR | Status: AC
  Filled 2020-07-15: qty 30

## 2020-07-15 MED ORDER — CHLORHEXIDINE GLUCONATE 0.12 % MT SOLN
OROMUCOSAL | Status: AC
  Administered 2020-07-15: 15 mL via OROMUCOSAL
  Filled 2020-07-15: qty 15

## 2020-07-15 MED ORDER — ROCURONIUM BROMIDE 100 MG/10ML IV SOLN
INTRAVENOUS | Status: DC | PRN
  Administered 2020-07-15 (×2): 50 mg via INTRAVENOUS

## 2020-07-15 MED ORDER — GABAPENTIN 300 MG PO CAPS
ORAL_CAPSULE | ORAL | Status: AC
  Administered 2020-07-15: 300 mg via ORAL
  Filled 2020-07-15: qty 1

## 2020-07-15 MED ORDER — SUGAMMADEX SODIUM 500 MG/5ML IV SOLN
INTRAVENOUS | Status: AC
  Filled 2020-07-15: qty 5

## 2020-07-15 MED ORDER — DOCUSATE SODIUM 100 MG PO CAPS: 100.0000 mg | ORAL_CAPSULE | Freq: Two times a day (BID) | ORAL | 0 refills | Status: AC | PRN

## 2020-07-15 MED ORDER — BUPIVACAINE LIPOSOME 1.3 % IJ SUSP
INTRAMUSCULAR | Status: AC
  Filled 2020-07-15: qty 20

## 2020-07-15 MED ORDER — DEXAMETHASONE SODIUM PHOSPHATE 10 MG/ML IJ SOLN
INTRAMUSCULAR | Status: DC | PRN
  Administered 2020-07-15: 5 mg via INTRAVENOUS

## 2020-07-15 MED ORDER — ACETAMINOPHEN 500 MG PO TABS: 1000.0000 mg | ORAL_TABLET | ORAL | Status: AC

## 2020-07-15 MED ORDER — PHENYLEPHRINE HCL (PRESSORS) 10 MG/ML IV SOLN
INTRAVENOUS | Status: AC
  Filled 2020-07-15: qty 1

## 2020-07-15 MED ORDER — EPHEDRINE 5 MG/ML INJ
INTRAVENOUS | Status: AC
  Filled 2020-07-15: qty 10

## 2020-07-15 MED ORDER — GABAPENTIN 300 MG PO CAPS: 300.0000 mg | ORAL_CAPSULE | ORAL | Status: AC

## 2020-07-15 SURGICAL SUPPLY — 97 items
ADH SKN CLS APL DERMABOND .7 (GAUZE/BANDAGES/DRESSINGS) ×2
APL PRP STRL LF DISP 70% ISPRP (MISCELLANEOUS) ×2
BLADE CLIPPER SURG (BLADE) ×3 IMPLANT
BLADE SURG SZ10 CARB STEEL (BLADE) ×3 IMPLANT
BLADE SURG SZ11 CARB STEEL (BLADE) ×3 IMPLANT
CANISTER SUCT 1200ML W/VALVE (MISCELLANEOUS) ×1 IMPLANT
CANNULA REDUC XI 12-8 STAPL (CANNULA) ×3
CANNULA REDUCER 12-8 DVNC XI (CANNULA) ×2 IMPLANT
CHLORAPREP W/TINT 26 (MISCELLANEOUS) ×3 IMPLANT
COVER TIP SHEARS 8 DVNC (MISCELLANEOUS) ×2 IMPLANT
COVER TIP SHEARS 8MM DA VINCI (MISCELLANEOUS) ×3
COVER WAND RF STERILE (DRAPES) IMPLANT
DEFOGGER SCOPE WARMER CLEARIFY (MISCELLANEOUS) ×3 IMPLANT
DERMABOND ADVANCED (GAUZE/BANDAGES/DRESSINGS) ×1
DERMABOND ADVANCED .7 DNX12 (GAUZE/BANDAGES/DRESSINGS) ×1 IMPLANT
DRAPE ARM DVNC X/XI (DISPOSABLE) ×8 IMPLANT
DRAPE COLUMN DVNC XI (DISPOSABLE) ×2 IMPLANT
DRAPE DA VINCI XI ARM (DISPOSABLE) ×12
DRAPE DA VINCI XI COLUMN (DISPOSABLE) ×3
DRAPE LEGGINS SURG 28X43 STRL (DRAPES) ×1 IMPLANT
DRSG OPSITE POSTOP 4X10 (GAUZE/BANDAGES/DRESSINGS) IMPLANT
DRSG OPSITE POSTOP 4X8 (GAUZE/BANDAGES/DRESSINGS) IMPLANT
ELECT BLADE 6.5 EXT (BLADE) ×2 IMPLANT
ELECT CAUTERY BLADE 6.4 (BLADE) ×2 IMPLANT
ELECT REM PT RETURN 9FT ADLT (ELECTROSURGICAL) ×3
ELECTRODE REM PT RTRN 9FT ADLT (ELECTROSURGICAL) ×2 IMPLANT
GLOVE SURG SYN 6.5 ES PF (GLOVE) ×9 IMPLANT
GLOVE SURG SYN 6.5 PF PI (GLOVE) ×3 IMPLANT
GLOVE SURG UNDER POLY LF SZ7 (GLOVE) ×9 IMPLANT
GOWN STRL REUS W/ TWL LRG LVL3 (GOWN DISPOSABLE) ×11 IMPLANT
GOWN STRL REUS W/TWL LRG LVL3 (GOWN DISPOSABLE) ×15
GRASPER SUT TROCAR 14GX15 (MISCELLANEOUS) IMPLANT
HANDLE YANKAUER SUCT BULB TIP (MISCELLANEOUS) ×3 IMPLANT
IRRIGATION STRYKERFLOW (MISCELLANEOUS) IMPLANT
IRRIGATOR STRYKERFLOW (MISCELLANEOUS)
IRRIGATOR SUCT 8 DISP DVNC XI (IRRIGATION / IRRIGATOR) IMPLANT
IRRIGATOR SUCTION 8MM XI DISP (IRRIGATION / IRRIGATOR)
IV NS 1000ML (IV SOLUTION)
IV NS 1000ML BAXH (IV SOLUTION) IMPLANT
KIT PINK PAD W/HEAD ARE REST (MISCELLANEOUS) ×3
KIT PINK PAD W/HEAD ARM REST (MISCELLANEOUS) ×2 IMPLANT
LABEL OR SOLS (LABEL) IMPLANT
MANIFOLD NEPTUNE II (INSTRUMENTS) ×3 IMPLANT
NDL INSUFFLATION 14GA 120MM (NEEDLE) ×1 IMPLANT
NEEDLE HYPO 22GX1.5 SAFETY (NEEDLE) ×3 IMPLANT
NEEDLE INSUFFLATION 14GA 120MM (NEEDLE) ×3 IMPLANT
OBTURATOR OPTICAL STANDARD 8MM (TROCAR) ×3
OBTURATOR OPTICAL STND 8 DVNC (TROCAR) ×2
OBTURATOR OPTICALSTD 8 DVNC (TROCAR) ×2 IMPLANT
PACK COLON CLEAN CLOSURE (MISCELLANEOUS) ×1 IMPLANT
PACK LAP CHOLECYSTECTOMY (MISCELLANEOUS) ×3 IMPLANT
PENCIL ELECTRO HAND CTR (MISCELLANEOUS) ×3 IMPLANT
PORT ACCESS TROCAR AIRSEAL 5 (TROCAR) ×1 IMPLANT
RELOAD STAPLE 45 3.5 BLU DVNC (STAPLE) IMPLANT
RELOAD STAPLE 60 3.5 BLU DVNC (STAPLE) IMPLANT
RELOAD STAPLER 3.5X45 BLU DVNC (STAPLE) ×2 IMPLANT
RELOAD STAPLER 3.5X60 BLU DVNC (STAPLE) IMPLANT
RETRACTOR RING XSMALL (MISCELLANEOUS) ×1 IMPLANT
RETRACTOR WOUND ALXS 18CM MED (MISCELLANEOUS) ×1 IMPLANT
RTRCTR WOUND ALEXIS 13CM XS SH (MISCELLANEOUS)
RTRCTR WOUND ALEXIS O 18CM MED (MISCELLANEOUS)
SEAL CANN UNIV 5-8 DVNC XI (MISCELLANEOUS) ×6 IMPLANT
SEAL XI 5MM-8MM UNIVERSAL (MISCELLANEOUS) ×9
SEALER VESSEL DA VINCI XI (MISCELLANEOUS) ×3
SEALER VESSEL EXT DVNC XI (MISCELLANEOUS) ×1 IMPLANT
SET BI-LUMEN FLTR TB AIRSEAL (TUBING) ×1 IMPLANT
SET TRI-LUMEN FLTR TB AIRSEAL (TUBING) ×1 IMPLANT
SLEEVE ENDOPATH XCEL 5M (ENDOMECHANICALS) ×3 IMPLANT
SOLUTION ELECTROLUBE (MISCELLANEOUS) ×3 IMPLANT
STAPLER 45 DA VINCI SURE FORM (STAPLE) ×3
STAPLER 45 SUREFORM DVNC (STAPLE) ×1 IMPLANT
STAPLER 60 DA VINCI SURE FORM (STAPLE)
STAPLER 60 SUREFORM DVNC (STAPLE) IMPLANT
STAPLER CANNULA SEAL DVNC XI (STAPLE) ×2 IMPLANT
STAPLER CANNULA SEAL XI (STAPLE) ×3
STAPLER RELOAD 3.5X45 BLU DVNC (STAPLE) ×2
STAPLER RELOAD 3.5X45 BLUE (STAPLE) ×3
STAPLER RELOAD 3.5X60 BLU DVNC (STAPLE)
STAPLER RELOAD 3.5X60 BLUE (STAPLE)
STAPLER SKIN PROX 35W (STAPLE) ×1 IMPLANT
SUT MNCRL 4-0 (SUTURE)
SUT MNCRL 4-0 27XMFL (SUTURE)
SUT MNCRL AB 4-0 PS2 18 (SUTURE) ×5 IMPLANT
SUT PDS AB 1 CT1 36 (SUTURE) ×3 IMPLANT
SUT SILK 3-0 (SUTURE) IMPLANT
SUT VIC AB 3-0 SH 27 (SUTURE)
SUT VIC AB 3-0 SH 27X BRD (SUTURE) ×4 IMPLANT
SUT VICRYL 0 AB UR-6 (SUTURE) ×1 IMPLANT
SUT VLOC 90 6 CV-15 VIOLET (SUTURE) ×1 IMPLANT
SUTURE MNCRL 4-0 27XMF (SUTURE) ×2 IMPLANT
SYR 30ML LL (SYRINGE) ×6 IMPLANT
SYS LAPSCP GELPORT 120MM (MISCELLANEOUS)
SYSTEM LAPSCP GELPORT 120MM (MISCELLANEOUS) ×1 IMPLANT
SYSTEM WECK SHIELD CLOSURE (TROCAR) IMPLANT
TRAY FOLEY MTR SLVR 16FR STAT (SET/KITS/TRAYS/PACK) ×3 IMPLANT
TROCAR XCEL NON-BLD 5MMX100MML (ENDOMECHANICALS) ×3 IMPLANT
TUBING EVAC SMOKE HEATED PNEUM (TUBING) ×3 IMPLANT

## 2020-07-15 NOTE — Transfer of Care (Signed)
Immediate Anesthesia Transfer of Care Note  Patient: Jesse Allison  Procedure(s) Performed: XI ROBOTIC ASSISTED LAPAROSCOPIC LYSIS OF ADHESION (Abdomen)  Patient Location: PACU  Anesthesia Type:General  Level of Consciousness: awake  Airway & Oxygen Therapy: Patient Spontanous Breathing and Patient connected to face mask oxygen  Post-op Assessment: Report given to RN  Post vital signs: stable  Last Vitals:  Vitals Value Taken Time  BP 135/81 07/15/20 1335  Temp    Pulse 80 07/15/20 1337  Resp 22 07/15/20 1337  SpO2 100 % 07/15/20 1337  Vitals shown include unvalidated device data.  Last Pain:  Vitals:   07/15/20 1116  TempSrc: Oral  PainSc: 0-No pain         Complications: No complications documented.

## 2020-07-15 NOTE — Op Note (Signed)
Preoperative diagnosis: Small bowel obstruction Postoperative diagnosis: Same  Procedure: Robotic assisted diagnostic laparoscopy, lysis of adhesion   Anesthesia: GETA   Surgeon: Benjamine Sprague   Wound Classification: clean    Specimen:  Scar tissue   Complications: None   Estimated Blood Loss: 10 mL  Indications: see H&P for further details.     FIndings: Scarring with adhesive band noted at mid to distal ileum with obvious transition point.  Additional adhesions noted on the cecum  No additional pathology noted elsewhere  Description of procedure: The patient was placed on the operating table in the supine position, both arms tucked. General anesthesia was induced. A time-out was completed verifying correct patient, procedure, site, positioning, and implant(s) and/or special equipment prior to beginning this procedure. The abdomen was prepped and draped in the usual sterile fashion.    Palmer's point located and Veress needle was inserted.  After confirming 2 clicks and a positive saline drop test, gas insufflation was initiated until the abdominal pressure was measured at 15 mmHg.  Afterwards, the Veress needle was removed and a 5 mm port was placed through the same site using Optiview technique after extending the incision with an 11 blade.  After local was infused,  additional incision was made 8 cm apart along the left side of the abdominal wall and three 56mm and one 72mm port placed under direct visualization. No injuries from trocar placements were noted. The table was placed in the reverse Trendelenburg position with the right side elevated.  Xi robotic platform was then brought to the operative field and docked at an angle from the left lower quadrant.  Tip up grasper and hook cautery was placed in right arm ports.  Fenestrated bipolar in left arm port.   Examination of the abdominal cavity noted adhesions around the cecum as well as an adhesive band extending along the distal  ileum.  There was an obvious transition point with narrowing of the small bowel within this band.  No other obvious pathology was noted within this area such as abnormal small bowel serosa, fat creeping of the mesentery, abnormal looking lymph nodes or masses within the bowel or mesentery.  The rest of the remaining small bowel was then inspected all the way to the ligament of Treitz and no additional pathology was noted.  Decision was made at this point to remove the adhesive band to free the small bowel to prevent further symptoms.  The band was transected using vessel sealer.  Emanation of the stomach did not reveal any active bleeding, however part of the small bowel serosa may have been extending into this band after a second look.  In order to prevent a possible bowel leak from the vessel sealer transection, a 45 mm blue load stapler was used to take additional tissue off the transected band.  This extra tissue was then pulled out of the abdominal cavity and passed off operative field pending pathology.  Final inspection of the abdominal cavity noted no signs of active bleeding, surrounding bowel, or any other pathology.   Robot was then undocked.  The 12 mm port site fascia was closed using PMI device.   Remaining ports then removed and abdomen allowed to collapse. All skin incisions then closed with subcuticular sutures Monocryl 4-0.  Wounds then dressed with dermabond.   The patient tolerated the procedure well, awakened from anesthesia and was taken to the postanesthesia care unit in satisfactory condition.  Foley removed.  Sponge count and instrument count correct  at the end of the procedure.

## 2020-07-15 NOTE — Progress Notes (Signed)
Has attempted to void several times without success. Abdomen is nondistended and soft. Bladder scanner shows 244 ml. No c/o discomfort or pain. Paged Dr Lysle Pearl. Orders to ambulate and give another half an hour to try again.

## 2020-07-15 NOTE — Anesthesia Preprocedure Evaluation (Signed)
Anesthesia Evaluation  Patient identified by MRN, date of birth, ID band Patient awake    Reviewed: Allergy & Precautions, H&P , NPO status , Patient's Chart, lab work & pertinent test results  History of Anesthesia Complications (+) PONV and history of anesthetic complications  Airway Mallampati: III  TM Distance: <3 FB Neck ROM: limited    Dental  (+) Chipped, Missing, Poor Dentition   Pulmonary shortness of breath and with exertion, sleep apnea , COPD, former smoker,    Pulmonary exam normal        Cardiovascular hypertension, + CAD, + Past MI, + Peripheral Vascular Disease and +CHF  negative cardio ROS Normal cardiovascular exam     Neuro/Psych TIA Neuromuscular disease negative psych ROS   GI/Hepatic Neg liver ROS, GERD  Medicated and Controlled,  Endo/Other  diabetes, Type 2Hypothyroidism   Renal/GU negative Renal ROS  negative genitourinary   Musculoskeletal  (+) Arthritis ,   Abdominal   Peds  Hematology negative hematology ROS (+)   Anesthesia Other Findings Past Medical History: No date: Anemia     Comment:  iron deficiency No date: Anxiety No date: Arthritis 2015: Cancer ([place])     Comment:  throat. had surgery and radiation No date: Complication of anesthesia     Comment:  woke up during AAA repair. Had an epidural and does NOT               want again. No date: Coronary artery disease No date: Dyspnea     Comment:  chronic cough since teenage years. No date: GERD (gastroesophageal reflux disease) No date: Gout No date: Hypercholesterolemia No date: Hypertension No date: Hypothyroidism 1993: Myocardial infarction ([place]) 08/11/2019: Obesity (BMI 30.0-34.9) No date: PONV (postoperative nausea and vomiting)     Comment:  Several times, vomitting for hours after surgery. No date: Sleep apnea No date: TIA (transient ischemic attack)  Past Surgical History: 2010: ABDOMINAL AORTIC ANEURYSM  REPAIR     Comment:  no longer followed on a regular basis 2000: BACK SURGERY     Comment:  L4 . piece of vertebrae broke off and lodged into spine.              had to remove the loose piece. No date: CARDIAC CATHETERIZATION 12/12/2016: CATARACT EXTRACTION W/PHACO; Left     Comment:  Procedure: CATARACT EXTRACTION PHACO AND INTRAOCULAR               LENS PLACEMENT (IOC);  Surgeon: Birder Robson, MD;                Location: [facility] ORS;  Service: Ophthalmology;  Laterality:              Left;  [place] 00:38.1 AP% 14.1 CDE 5.38 FLUID PACK LOT #               [id number] 12/26/2016: CATARACT EXTRACTION W/PHACO; Right     Comment:  Procedure: CATARACT EXTRACTION PHACO AND INTRAOCULAR               LENS PLACEMENT (IOC);  Surgeon: Birder Robson, MD;                Location: [facility] ORS;  Service: Ophthalmology;  Laterality:              Right;  [place] 00:53.7 AP% 12.9 CDE 6.92 Fluid Pack Lot #               [id number] 1993: CORONARY ARTERY BYPASS GRAFT No  date: EYE SURGERY; Bilateral 2015: LARYNGOSCOPY     Comment:  tumor removal followed by radiation treatments. No date: TESTICLAR CYST EXCISION 08/21/2019: TOTAL HIP ARTHROPLASTY; Right     Comment:  Procedure: TOTAL HIP ARTHROPLASTY ANTERIOR APPROACH;                Surgeon: Hessie Knows, MD;  Location: [facility] ORS;                Service: Orthopedics;  Laterality: Right;  BMI    Body Mass Index: 33.19 kg/m      Reproductive/Obstetrics negative OB ROS                             Anesthesia Physical  Anesthesia Plan  ASA: III  Anesthesia Plan: General ETT   Post-op Pain Management:    Induction: Intravenous  PONV Risk Score and Plan: Ondansetron, Dexamethasone, Midazolam and Treatment may vary due to age or medical condition  Airway Management Planned: Oral ETT  Additional Equipment:   Intra-op Plan:   Post-operative Plan: Extubation in OR  Informed Consent: I have reviewed the patients History and  Physical, chart, labs and discussed the procedure including the risks, benefits and alternatives for the proposed anesthesia with the patient or authorized representative who has indicated his/her understanding and acceptance.     Dental Advisory Given  Plan Discussed with: Anesthesiologist, CRNA and Surgeon  Anesthesia Plan Comments: (Patient consented for risks of anesthesia including but not limited to:  - adverse reactions to medications - damage to eyes, teeth, lips or other oral mucosa - nerve damage due to positioning  - sore throat or hoarseness - Damage to heart, brain, nerves, lungs, other parts of body or loss of life  Patient voiced understanding.)        Anesthesia Quick Evaluation

## 2020-07-15 NOTE — Interval H&P Note (Signed)
History and Physical Interval Note:  07/15/2020 11:29 AM  Jesse Allison  has presented today for surgery, with the diagnosis of K56.609 SBO.  The various methods of treatment have been discussed with the patient and family. After consideration of risks, benefits and other options for treatment, the patient has consented to  Procedure(s) with comments: XI [place] (N/A) - 210 MINS as a surgical intervention.  The patient's history has been reviewed, patient examined, no change in status, stable for surgery.  I have reviewed the patient's chart and labs.  Questions were answered to the patient's satisfaction.     [name]

## 2020-07-15 NOTE — Interval H&P Note (Signed)
History and Physical Interval Note:  07/15/2020 11:30 AM  Jesse Allison  has presented today for surgery, with the diagnosis of K56.609 SBO.  The various methods of treatment have been discussed with the patient and family. After consideration of risks, benefits and other options for treatment, the patient has consented to  Procedure(s) with comments: XI [place] (N/A) - 210 MINS as a surgical intervention.  The patient's history has been reviewed, patient examined, no change in status, stable for surgery.  I have reviewed the patient's chart and labs.  Questions were answered to the patient's satisfaction.     Naomi Castrogiovanni Lysle Pearl

## 2020-07-15 NOTE — Anesthesia Postprocedure Evaluation (Signed)
Anesthesia Post Note  Patient: Jesse Allison  Procedure(s) Performed: XI ROBOTIC ASSISTED LAPAROSCOPIC LYSIS OF ADHESION (Abdomen)  Patient location during evaluation: PACU Anesthesia Type: General Level of consciousness: awake and alert Pain management: pain level controlled Vital Signs Assessment: post-procedure vital signs reviewed and stable Respiratory status: spontaneous breathing, nonlabored ventilation, respiratory function stable and patient connected to nasal cannula oxygen Cardiovascular status: blood pressure returned to baseline and stable Postop Assessment: no apparent nausea or vomiting Anesthetic complications: no   No complications documented.   Last Vitals:  Vitals:   07/15/20 1335 07/15/20 1345  BP: 135/81 (!) 158/75  Pulse: 79 75  Resp:  17  Temp: (!) 36.1 C   SpO2: 100% 100%    Last Pain:  Vitals:   07/15/20 1412  TempSrc:   PainSc: 5                  [name] Jacob Cicero

## 2020-07-15 NOTE — Anesthesia Procedure Notes (Signed)
Procedure Name: Intubation Date/Time: 07/15/2020 11:54 AM Performed by: Lerry Liner, CRNA Pre-anesthesia Checklist: Patient identified, Emergency Drugs available, Suction available and Patient being monitored Patient Re-evaluated:Patient Re-evaluated prior to induction Oxygen Delivery Method: Circle system utilized Preoxygenation: Pre-oxygenation with 100% oxygen Induction Type: IV induction Ventilation: Mask ventilation without difficulty Laryngoscope Size: McGraph and 4 Grade View: Grade I Tube type: Oral Tube size: 7.5 mm Number of attempts: 1 Airway Equipment and Method: Stylet and Oral airway Placement Confirmation: ETT inserted through vocal cords under direct vision,  positive ETCO2 and breath sounds checked- equal and bilateral Secured at: 22 cm Tube secured with: Tape Dental Injury: Teeth and Oropharynx as per pre-operative assessment

## 2020-07-15 NOTE — Progress Notes (Signed)
Has been up ambulating. Still drinking fluids with no urge to void. Continues to deny any bladder discomfort or pain. Per Dr Lysle Pearl, discharge to home with strict orders to come back to the hospital if unable to void within 8 hrs or if abdominal pain develops. Also instructed to take his Lasix upon arriving home.

## 2020-07-15 NOTE — Progress Notes (Signed)
Jesse Allison called and stated after getting home, in his own environment and took his Lasix, he was able to void without difficulty.

## 2020-07-15 NOTE — Discharge Instructions (Signed)
Laparoscopic lysis of adhesions, Care After This sheet gives you information about how to care for yourself after your procedure. Your doctor may also give you more specific instructions. If you have problems or questions, contact your doctor. Follow these instructions at home: Care for cuts from surgery (incisions)   Follow instructions from your doctor about how to take care of your cuts from surgery. Make sure you: ? Wash your hands with soap and water before you change your bandage (dressing). If you cannot use soap and water, use hand sanitizer. ? Change your bandage as told by your doctor. ? Leave stitches (sutures), skin glue, or skin tape (adhesive) strips in place. They may need to stay in place for 2 weeks or longer. If tape strips get loose and curl up, you may trim the loose edges. Do not remove tape strips completely unless your doctor says it is okay.  Do not take baths, swim, or use a hot tub until your doctor says it is okay. OK TO SHOWER 24HRS AFTER YOUR SURGERY.   Check your surgical cut area every day for signs of infection. Check for: ? More redness, swelling, or pain. ? More fluid or blood. ? Warmth. ? Pus or a bad smell. Activity  Do not drive or use heavy machinery while taking prescription pain medicine.  Do not play contact sports until your doctor says it is okay.  Do not drive for 24 hours if you were given a medicine to help you relax (sedative).  Rest as needed. Do not return to work or school until your doctor says it is okay. General instructions . RESUME ASPIRIN AND PLAVIX IN 48HRS  . tylenol as needed for discomfort.   .  Use narcotics, if prescribed, only when tylenol  is not enough to control pain. .  325-650mg  every 8hrs to max of 3000mg /24hrs (including the 325mg  in every norco dose) for the tylenol.     To prevent or treat constipation while you are taking prescription pain medicine, your doctor may recommend that you: ? Drink enough fluid to  keep your pee (urine) clear or pale yellow. ? Take over-the-counter or prescription medicines. ? Eat foods that are high in fiber, such as fresh fruits and vegetables, whole grains, and beans. ? Limit foods that are high in fat and processed sugars, such as fried and sweet foods. Contact a doctor if:  You develop a rash.  You have more redness, swelling, or pain around your surgical cuts.  You have more fluid or blood coming from your surgical cuts.  Your surgical cuts feel warm to the touch.  You have pus or a bad smell coming from your surgical cuts.  You have a fever.  One or more of your surgical cuts breaks open. Get help right away if:  You have trouble breathing.  You have chest pain.  You have pain that is getting worse in your shoulders.  You faint or feel dizzy when you stand.  You have very bad pain in your belly (abdomen).  You are sick to your stomach (nauseous) for more than one day.  You have throwing up (vomiting) that lasts for more than one day.  You have leg pain. This information is not intended to replace advice given to you by your health care provider. Make sure you discuss any questions you have with your health care provider. Document Released: 01/04/2008 Document Revised: 10/16/2015 Document Reviewed: 09/13/2015 Elsevier Interactive Patient Education  2019 [place]  SURGERY  DISCHARGE INSTRUCTIONS   1) The drugs that you were given will stay in your system until tomorrow so for the next 24 hours you should not:  A) Drive an automobile B) Make any legal decisions C) Drink any alcoholic beverage   2) You may resume regular meals tomorrow.  Today it is better to start with liquids and gradually work up to solid foods.  You may eat anything you prefer, but it is better to start with liquids, then soup and crackers, and gradually work up to solid foods.   3) Please notify your doctor immediately if you have any unusual  bleeding, trouble breathing, redness and pain at the surgery site, drainage, fever, or pain not relieved by medication.    4) Additional Instructions:        Please contact your physician with any problems or Same Day Surgery at [phone], Monday through Friday 6 am to 4 pm, or [place] at [facility] number at [phone].

## 2020-07-16 ENCOUNTER — Encounter: Payer: Self-pay | Admitting: Surgery

## 2020-07-19 LAB — SURGICAL PATHOLOGY

## 2020-07-28 DIAGNOSIS — I739 Peripheral vascular disease, unspecified: Secondary | ICD-10-CM | POA: Diagnosis not present

## 2020-07-28 DIAGNOSIS — I25710 Atherosclerosis of autologous vein coronary artery bypass graft(s) with unstable angina pectoris: Secondary | ICD-10-CM | POA: Diagnosis not present

## 2020-07-28 DIAGNOSIS — I10 Essential (primary) hypertension: Secondary | ICD-10-CM | POA: Diagnosis not present

## 2020-07-28 DIAGNOSIS — J439 Emphysema, unspecified: Secondary | ICD-10-CM | POA: Diagnosis not present

## 2020-07-28 DIAGNOSIS — G4733 Obstructive sleep apnea (adult) (pediatric): Secondary | ICD-10-CM | POA: Diagnosis not present

## 2020-07-28 DIAGNOSIS — E782 Mixed hyperlipidemia: Secondary | ICD-10-CM | POA: Diagnosis not present

## 2020-07-28 DIAGNOSIS — E1159 Type 2 diabetes mellitus with other circulatory complications: Secondary | ICD-10-CM | POA: Diagnosis not present

## 2020-07-28 DIAGNOSIS — I714 Abdominal aortic aneurysm, without rupture: Secondary | ICD-10-CM | POA: Diagnosis not present

## 2020-07-28 DIAGNOSIS — K219 Gastro-esophageal reflux disease without esophagitis: Secondary | ICD-10-CM | POA: Diagnosis not present

## 2020-07-28 DIAGNOSIS — I208 Other forms of angina pectoris: Secondary | ICD-10-CM | POA: Diagnosis not present

## 2020-07-28 DIAGNOSIS — R0602 Shortness of breath: Secondary | ICD-10-CM | POA: Diagnosis not present

## 2020-08-03 DIAGNOSIS — G4733 Obstructive sleep apnea (adult) (pediatric): Secondary | ICD-10-CM | POA: Diagnosis not present

## 2020-08-05 ENCOUNTER — Ambulatory Visit: Payer: PPO | Admitting: Podiatry

## 2020-08-10 ENCOUNTER — Other Ambulatory Visit (HOSPITAL_COMMUNITY): Payer: Self-pay | Admitting: Gastroenterology

## 2020-08-10 ENCOUNTER — Other Ambulatory Visit: Payer: Self-pay | Admitting: Gastroenterology

## 2020-08-10 DIAGNOSIS — R933 Abnormal findings on diagnostic imaging of other parts of digestive tract: Secondary | ICD-10-CM | POA: Diagnosis not present

## 2020-08-10 DIAGNOSIS — R140 Abdominal distension (gaseous): Secondary | ICD-10-CM

## 2020-08-10 DIAGNOSIS — R112 Nausea with vomiting, unspecified: Secondary | ICD-10-CM | POA: Diagnosis not present

## 2020-08-10 DIAGNOSIS — R1033 Periumbilical pain: Secondary | ICD-10-CM | POA: Diagnosis not present

## 2020-08-11 ENCOUNTER — Other Ambulatory Visit: Payer: Self-pay

## 2020-08-11 ENCOUNTER — Ambulatory Visit
Admission: RE | Admit: 2020-08-11 | Discharge: 2020-08-11 | Disposition: A | Discharge status: Home / Self Care | Payer: PPO | Source: Ambulatory Visit | Attending: Gastroenterology | Admitting: Gastroenterology

## 2020-08-11 DIAGNOSIS — R140 Abdominal distension (gaseous): Secondary | ICD-10-CM | POA: Insufficient documentation

## 2020-08-11 DIAGNOSIS — R933 Abnormal findings on diagnostic imaging of other parts of digestive tract: Secondary | ICD-10-CM | POA: Insufficient documentation

## 2020-08-11 DIAGNOSIS — R109 Unspecified abdominal pain: Secondary | ICD-10-CM | POA: Diagnosis not present

## 2020-08-11 LAB — POCT I-STAT CREATININE: Creatinine, Ser: 1.2 mg/dL (ref 0.61–1.24)

## 2020-08-11 MED ORDER — IOHEXOL 300 MG/ML  SOLN
125.0000 mL | Freq: Once | INTRAMUSCULAR | Status: AC | PRN
  Administered 2020-08-11: 125 mL via INTRAVENOUS

## 2020-08-30 DIAGNOSIS — K56600 Partial intestinal obstruction, unspecified as to cause: Secondary | ICD-10-CM | POA: Diagnosis not present

## 2020-09-02 ENCOUNTER — Other Ambulatory Visit: Payer: Self-pay

## 2020-09-02 ENCOUNTER — Encounter: Payer: Self-pay | Admitting: Podiatry

## 2020-09-02 ENCOUNTER — Ambulatory Visit: Payer: PPO | Admitting: Podiatry

## 2020-09-02 DIAGNOSIS — D689 Coagulation defect, unspecified: Secondary | ICD-10-CM | POA: Diagnosis not present

## 2020-09-02 DIAGNOSIS — M79676 Pain in unspecified toe(s): Secondary | ICD-10-CM | POA: Diagnosis not present

## 2020-09-02 DIAGNOSIS — E119 Type 2 diabetes mellitus without complications: Secondary | ICD-10-CM | POA: Diagnosis not present

## 2020-09-02 DIAGNOSIS — B351 Tinea unguium: Secondary | ICD-10-CM | POA: Diagnosis not present

## 2020-09-02 DIAGNOSIS — K56600 Partial intestinal obstruction, unspecified as to cause: Secondary | ICD-10-CM | POA: Diagnosis not present

## 2020-09-02 NOTE — Progress Notes (Signed)
This patient returns to my office for at risk foot care.  This patient requires this care by a professional since this patient will be at risk due to having diabetes, coagulation disorder and PVD.  This patient is unable to cut nails himself since the patient cannot reach his nails.These nails are painful walking and wearing shoes.  This patient presents for at risk foot care today.  General Appearance  Alert, conversant and in no acute stress.  Vascular  Dorsalis pedis and posterior tibial  pulses are weakly  palpable  bilaterally.  Capillary return is within normal limits  bilaterally. Cold feet  Bilaterally.  Absent digital hair.  Neurologic  Senn-Weinstein monofilament wire test within normal limits  bilaterally. Muscle power within normal limits bilaterally.  Nails Thick disfigured discolored nails with subungual debris  from hallux to fifth toes bilaterally. No evidence of bacterial infection or drainage bilaterally.  Orthopedic  No limitations of motion  feet .  No crepitus or effusions noted.  No bony pathology or digital deformities noted.  Skin  normotropic skin with no porokeratosis noted bilaterally.  No signs of infections or ulcers noted.     Onychomycosis  Pain in right toes  Pain in left toes  Consent was obtained for treatment procedures.   Mechanical debridement of nails 1-5  bilaterally performed with a nail nipper.  Filed with dremel without incident.    Return office visit  3 months                     Told patient to return for periodic foot care and evaluation due to potential at risk complications.   Gardiner Barefoot DPM

## 2020-09-27 DIAGNOSIS — Z8521 Personal history of malignant neoplasm of larynx: Secondary | ICD-10-CM | POA: Diagnosis not present

## 2020-09-27 DIAGNOSIS — K219 Gastro-esophageal reflux disease without esophagitis: Secondary | ICD-10-CM | POA: Diagnosis not present

## 2020-09-27 DIAGNOSIS — Z08 Encounter for follow-up examination after completed treatment for malignant neoplasm: Secondary | ICD-10-CM | POA: Diagnosis not present

## 2020-09-27 DIAGNOSIS — H02132 Senile ectropion of right lower eyelid: Secondary | ICD-10-CM | POA: Diagnosis not present

## 2020-09-27 DIAGNOSIS — H6123 Impacted cerumen, bilateral: Secondary | ICD-10-CM | POA: Diagnosis not present

## 2020-09-28 DIAGNOSIS — G4733 Obstructive sleep apnea (adult) (pediatric): Secondary | ICD-10-CM | POA: Diagnosis not present

## 2020-09-28 DIAGNOSIS — R739 Hyperglycemia, unspecified: Secondary | ICD-10-CM | POA: Diagnosis not present

## 2020-09-28 DIAGNOSIS — I714 Abdominal aortic aneurysm, without rupture: Secondary | ICD-10-CM | POA: Diagnosis not present

## 2020-09-28 DIAGNOSIS — J439 Emphysema, unspecified: Secondary | ICD-10-CM | POA: Diagnosis not present

## 2020-09-28 DIAGNOSIS — E782 Mixed hyperlipidemia: Secondary | ICD-10-CM | POA: Diagnosis not present

## 2020-10-04 DIAGNOSIS — K56699 Other intestinal obstruction unspecified as to partial versus complete obstruction: Secondary | ICD-10-CM | POA: Diagnosis not present

## 2020-10-04 DIAGNOSIS — Z955 Presence of coronary angioplasty implant and graft: Secondary | ICD-10-CM | POA: Diagnosis not present

## 2020-10-04 DIAGNOSIS — I959 Hypotension, unspecified: Secondary | ICD-10-CM | POA: Diagnosis not present

## 2020-10-04 DIAGNOSIS — E782 Mixed hyperlipidemia: Secondary | ICD-10-CM | POA: Diagnosis not present

## 2020-10-04 DIAGNOSIS — K529 Noninfective gastroenteritis and colitis, unspecified: Secondary | ICD-10-CM | POA: Diagnosis not present

## 2020-10-04 DIAGNOSIS — E039 Hypothyroidism, unspecified: Secondary | ICD-10-CM | POA: Diagnosis not present

## 2020-10-04 DIAGNOSIS — E538 Deficiency of other specified B group vitamins: Secondary | ICD-10-CM | POA: Diagnosis not present

## 2020-10-04 DIAGNOSIS — I739 Peripheral vascular disease, unspecified: Secondary | ICD-10-CM | POA: Diagnosis not present

## 2020-10-04 DIAGNOSIS — I714 Abdominal aortic aneurysm, without rupture: Secondary | ICD-10-CM | POA: Diagnosis not present

## 2020-10-04 DIAGNOSIS — F419 Anxiety disorder, unspecified: Secondary | ICD-10-CM | POA: Diagnosis not present

## 2020-10-04 DIAGNOSIS — K56600 Partial intestinal obstruction, unspecified as to cause: Secondary | ICD-10-CM | POA: Diagnosis not present

## 2020-10-04 DIAGNOSIS — E1151 Type 2 diabetes mellitus with diabetic peripheral angiopathy without gangrene: Secondary | ICD-10-CM | POA: Diagnosis not present

## 2020-10-04 DIAGNOSIS — R109 Unspecified abdominal pain: Secondary | ICD-10-CM | POA: Diagnosis not present

## 2020-10-04 DIAGNOSIS — I25810 Atherosclerosis of coronary artery bypass graft(s) without angina pectoris: Secondary | ICD-10-CM | POA: Diagnosis not present

## 2020-10-04 DIAGNOSIS — Z86010 Personal history of colonic polyps: Secondary | ICD-10-CM | POA: Diagnosis not present

## 2020-10-04 DIAGNOSIS — I5022 Chronic systolic (congestive) heart failure: Secondary | ICD-10-CM | POA: Diagnosis not present

## 2020-10-04 DIAGNOSIS — Z01818 Encounter for other preprocedural examination: Secondary | ICD-10-CM | POA: Diagnosis not present

## 2020-10-04 DIAGNOSIS — K429 Umbilical hernia without obstruction or gangrene: Secondary | ICD-10-CM | POA: Diagnosis not present

## 2020-10-04 DIAGNOSIS — I2510 Atherosclerotic heart disease of native coronary artery without angina pectoris: Secondary | ICD-10-CM | POA: Diagnosis not present

## 2020-10-04 DIAGNOSIS — I110 Hypertensive heart disease with heart failure: Secondary | ICD-10-CM | POA: Diagnosis not present

## 2020-10-04 DIAGNOSIS — Z8521 Personal history of malignant neoplasm of larynx: Secondary | ICD-10-CM | POA: Diagnosis not present

## 2020-10-04 DIAGNOSIS — D692 Other nonthrombocytopenic purpura: Secondary | ICD-10-CM | POA: Diagnosis not present

## 2020-10-04 DIAGNOSIS — J439 Emphysema, unspecified: Secondary | ICD-10-CM | POA: Diagnosis not present

## 2020-10-04 DIAGNOSIS — Z8673 Personal history of transient ischemic attack (TIA), and cerebral infarction without residual deficits: Secondary | ICD-10-CM | POA: Diagnosis not present

## 2020-10-04 DIAGNOSIS — I25710 Atherosclerosis of autologous vein coronary artery bypass graft(s) with unstable angina pectoris: Secondary | ICD-10-CM | POA: Diagnosis not present

## 2020-10-04 DIAGNOSIS — N400 Enlarged prostate without lower urinary tract symptoms: Secondary | ICD-10-CM | POA: Diagnosis not present

## 2020-10-04 DIAGNOSIS — E119 Type 2 diabetes mellitus without complications: Secondary | ICD-10-CM | POA: Diagnosis not present

## 2020-10-04 DIAGNOSIS — G8918 Other acute postprocedural pain: Secondary | ICD-10-CM | POA: Diagnosis not present

## 2020-10-04 DIAGNOSIS — G4733 Obstructive sleep apnea (adult) (pediatric): Secondary | ICD-10-CM | POA: Diagnosis not present

## 2020-10-04 DIAGNOSIS — Z20822 Contact with and (suspected) exposure to covid-19: Secondary | ICD-10-CM | POA: Diagnosis not present

## 2020-10-04 DIAGNOSIS — K219 Gastro-esophageal reflux disease without esophagitis: Secondary | ICD-10-CM | POA: Diagnosis not present

## 2020-10-04 DIAGNOSIS — Z0000 Encounter for general adult medical examination without abnormal findings: Secondary | ICD-10-CM | POA: Diagnosis not present

## 2020-10-04 DIAGNOSIS — D508 Other iron deficiency anemias: Secondary | ICD-10-CM | POA: Diagnosis not present

## 2020-10-04 DIAGNOSIS — I252 Old myocardial infarction: Secondary | ICD-10-CM | POA: Diagnosis not present

## 2020-10-04 DIAGNOSIS — Z96641 Presence of right artificial hip joint: Secondary | ICD-10-CM | POA: Diagnosis not present

## 2020-10-04 DIAGNOSIS — R634 Abnormal weight loss: Secondary | ICD-10-CM | POA: Diagnosis not present

## 2020-10-04 DIAGNOSIS — I10 Essential (primary) hypertension: Secondary | ICD-10-CM | POA: Diagnosis not present

## 2020-10-04 DIAGNOSIS — I272 Pulmonary hypertension, unspecified: Secondary | ICD-10-CM | POA: Diagnosis not present

## 2020-10-05 DIAGNOSIS — G4733 Obstructive sleep apnea (adult) (pediatric): Secondary | ICD-10-CM | POA: Diagnosis not present

## 2020-10-05 DIAGNOSIS — I5022 Chronic systolic (congestive) heart failure: Secondary | ICD-10-CM | POA: Diagnosis not present

## 2020-10-05 DIAGNOSIS — Z0000 Encounter for general adult medical examination without abnormal findings: Secondary | ICD-10-CM | POA: Diagnosis not present

## 2020-10-05 DIAGNOSIS — E039 Hypothyroidism, unspecified: Secondary | ICD-10-CM | POA: Diagnosis not present

## 2020-10-05 DIAGNOSIS — J439 Emphysema, unspecified: Secondary | ICD-10-CM | POA: Diagnosis not present

## 2020-10-05 DIAGNOSIS — E119 Type 2 diabetes mellitus without complications: Secondary | ICD-10-CM | POA: Diagnosis not present

## 2020-10-05 DIAGNOSIS — E782 Mixed hyperlipidemia: Secondary | ICD-10-CM | POA: Diagnosis not present

## 2020-10-05 DIAGNOSIS — I10 Essential (primary) hypertension: Secondary | ICD-10-CM | POA: Diagnosis not present

## 2020-10-05 DIAGNOSIS — I25710 Atherosclerosis of autologous vein coronary artery bypass graft(s) with unstable angina pectoris: Secondary | ICD-10-CM | POA: Diagnosis not present

## 2020-10-05 DIAGNOSIS — I739 Peripheral vascular disease, unspecified: Secondary | ICD-10-CM | POA: Diagnosis not present

## 2020-10-05 DIAGNOSIS — I714 Abdominal aortic aneurysm, without rupture: Secondary | ICD-10-CM | POA: Diagnosis not present

## 2020-10-05 DIAGNOSIS — D692 Other nonthrombocytopenic purpura: Secondary | ICD-10-CM | POA: Diagnosis not present

## 2020-10-07 DIAGNOSIS — G8918 Other acute postprocedural pain: Secondary | ICD-10-CM | POA: Diagnosis not present

## 2020-10-07 DIAGNOSIS — R109 Unspecified abdominal pain: Secondary | ICD-10-CM | POA: Diagnosis not present

## 2020-10-07 DIAGNOSIS — K56600 Partial intestinal obstruction, unspecified as to cause: Secondary | ICD-10-CM | POA: Diagnosis not present

## 2020-10-20 DIAGNOSIS — Z9889 Other specified postprocedural states: Secondary | ICD-10-CM | POA: Diagnosis not present

## 2020-11-08 DIAGNOSIS — E782 Mixed hyperlipidemia: Secondary | ICD-10-CM | POA: Diagnosis not present

## 2020-11-08 DIAGNOSIS — E538 Deficiency of other specified B group vitamins: Secondary | ICD-10-CM | POA: Diagnosis not present

## 2020-11-08 DIAGNOSIS — M109 Gout, unspecified: Secondary | ICD-10-CM | POA: Diagnosis not present

## 2020-11-08 DIAGNOSIS — E119 Type 2 diabetes mellitus without complications: Secondary | ICD-10-CM | POA: Diagnosis not present

## 2020-11-08 DIAGNOSIS — D508 Other iron deficiency anemias: Secondary | ICD-10-CM | POA: Diagnosis not present

## 2020-11-08 DIAGNOSIS — E039 Hypothyroidism, unspecified: Secondary | ICD-10-CM | POA: Diagnosis not present

## 2020-11-09 DIAGNOSIS — Z6830 Body mass index (BMI) 30.0-30.9, adult: Secondary | ICD-10-CM | POA: Diagnosis not present

## 2020-11-09 DIAGNOSIS — Z8679 Personal history of other diseases of the circulatory system: Secondary | ICD-10-CM | POA: Diagnosis not present

## 2020-11-09 DIAGNOSIS — I25708 Atherosclerosis of coronary artery bypass graft(s), unspecified, with other forms of angina pectoris: Secondary | ICD-10-CM | POA: Diagnosis not present

## 2020-11-09 DIAGNOSIS — D692 Other nonthrombocytopenic purpura: Secondary | ICD-10-CM | POA: Diagnosis not present

## 2020-11-09 DIAGNOSIS — Z7982 Long term (current) use of aspirin: Secondary | ICD-10-CM | POA: Diagnosis not present

## 2020-11-09 DIAGNOSIS — Z7902 Long term (current) use of antithrombotics/antiplatelets: Secondary | ICD-10-CM | POA: Diagnosis not present

## 2020-11-09 DIAGNOSIS — G4733 Obstructive sleep apnea (adult) (pediatric): Secondary | ICD-10-CM | POA: Diagnosis not present

## 2020-11-09 DIAGNOSIS — I509 Heart failure, unspecified: Secondary | ICD-10-CM | POA: Diagnosis not present

## 2020-11-09 DIAGNOSIS — I110 Hypertensive heart disease with heart failure: Secondary | ICD-10-CM | POA: Diagnosis not present

## 2020-11-09 DIAGNOSIS — Z87891 Personal history of nicotine dependence: Secondary | ICD-10-CM | POA: Diagnosis not present

## 2020-11-09 DIAGNOSIS — J449 Chronic obstructive pulmonary disease, unspecified: Secondary | ICD-10-CM | POA: Diagnosis not present

## 2020-11-09 DIAGNOSIS — I739 Peripheral vascular disease, unspecified: Secondary | ICD-10-CM | POA: Diagnosis not present

## 2020-11-11 ENCOUNTER — Ambulatory Visit: Payer: PPO | Admitting: Podiatry

## 2020-11-11 ENCOUNTER — Other Ambulatory Visit: Payer: Self-pay

## 2020-11-11 DIAGNOSIS — M79676 Pain in unspecified toe(s): Secondary | ICD-10-CM

## 2020-11-11 DIAGNOSIS — E119 Type 2 diabetes mellitus without complications: Secondary | ICD-10-CM

## 2020-11-11 DIAGNOSIS — D689 Coagulation defect, unspecified: Secondary | ICD-10-CM

## 2020-11-11 DIAGNOSIS — B351 Tinea unguium: Secondary | ICD-10-CM | POA: Diagnosis not present

## 2020-11-11 NOTE — Progress Notes (Signed)
This patient returns to my office for at risk foot care.  This patient requires this care by a professional since this patient will be at risk due to having diabetes, coagulation disorder and PVD.  This patient is unable to cut nails himself since the patient cannot reach his nails.These nails are painful walking and wearing shoes.  This patient presents for at risk foot care today.  General Appearance  Alert, conversant and in no acute stress.  Vascular  Dorsalis pedis and posterior tibial  pulses are weakly  palpable  bilaterally.  Capillary return is within normal limits  bilaterally. Cold feet  Bilaterally.  Absent digital hair.  Neurologic  Senn-Weinstein monofilament wire test within normal limits  bilaterally. Muscle power within normal limits bilaterally.  Nails Thick disfigured discolored nails with subungual debris  from hallux to fifth toes bilaterally. No evidence of bacterial infection or drainage bilaterally.  Orthopedic  No limitations of motion  feet .  No crepitus or effusions noted.  No bony pathology or digital deformities noted.  Skin  normotropic skin with no porokeratosis noted bilaterally.  No signs of infections or ulcers noted.     Onychomycosis  Pain in right toes  Pain in left toes  Consent was obtained for treatment procedures.   Mechanical debridement of nails 1-5  bilaterally performed with a nail nipper.  Filed with dremel without incident.    Return office visit  10 weeks                   Told patient to return for periodic foot care and evaluation due to potential at risk complications.   Gardiner Barefoot DPM

## 2020-11-15 DIAGNOSIS — J439 Emphysema, unspecified: Secondary | ICD-10-CM | POA: Diagnosis not present

## 2020-11-15 DIAGNOSIS — K219 Gastro-esophageal reflux disease without esophagitis: Secondary | ICD-10-CM | POA: Diagnosis not present

## 2020-11-15 DIAGNOSIS — I739 Peripheral vascular disease, unspecified: Secondary | ICD-10-CM | POA: Diagnosis not present

## 2020-11-15 DIAGNOSIS — E538 Deficiency of other specified B group vitamins: Secondary | ICD-10-CM | POA: Diagnosis not present

## 2020-11-15 DIAGNOSIS — E782 Mixed hyperlipidemia: Secondary | ICD-10-CM | POA: Diagnosis not present

## 2020-11-15 DIAGNOSIS — D508 Other iron deficiency anemias: Secondary | ICD-10-CM | POA: Diagnosis not present

## 2020-11-15 DIAGNOSIS — E039 Hypothyroidism, unspecified: Secondary | ICD-10-CM | POA: Diagnosis not present

## 2020-11-15 DIAGNOSIS — E119 Type 2 diabetes mellitus without complications: Secondary | ICD-10-CM | POA: Diagnosis not present

## 2020-11-15 DIAGNOSIS — I714 Abdominal aortic aneurysm, without rupture: Secondary | ICD-10-CM | POA: Diagnosis not present

## 2020-11-15 DIAGNOSIS — I5022 Chronic systolic (congestive) heart failure: Secondary | ICD-10-CM | POA: Diagnosis not present

## 2020-11-15 DIAGNOSIS — I10 Essential (primary) hypertension: Secondary | ICD-10-CM | POA: Diagnosis not present

## 2020-11-15 DIAGNOSIS — G4733 Obstructive sleep apnea (adult) (pediatric): Secondary | ICD-10-CM | POA: Diagnosis not present

## 2020-12-29 DIAGNOSIS — M5416 Radiculopathy, lumbar region: Secondary | ICD-10-CM | POA: Diagnosis not present

## 2020-12-29 DIAGNOSIS — M5126 Other intervertebral disc displacement, lumbar region: Secondary | ICD-10-CM | POA: Diagnosis not present

## 2020-12-29 DIAGNOSIS — M48061 Spinal stenosis, lumbar region without neurogenic claudication: Secondary | ICD-10-CM | POA: Diagnosis not present

## 2020-12-29 DIAGNOSIS — M5136 Other intervertebral disc degeneration, lumbar region: Secondary | ICD-10-CM | POA: Diagnosis not present

## 2021-01-05 DIAGNOSIS — M5416 Radiculopathy, lumbar region: Secondary | ICD-10-CM | POA: Diagnosis not present

## 2021-01-05 DIAGNOSIS — M48062 Spinal stenosis, lumbar region with neurogenic claudication: Secondary | ICD-10-CM | POA: Diagnosis not present

## 2021-01-27 ENCOUNTER — Other Ambulatory Visit: Payer: Self-pay

## 2021-01-27 ENCOUNTER — Ambulatory Visit: Payer: PPO | Admitting: Podiatry

## 2021-01-27 ENCOUNTER — Encounter: Payer: Self-pay | Admitting: Podiatry

## 2021-01-27 DIAGNOSIS — D689 Coagulation defect, unspecified: Secondary | ICD-10-CM | POA: Diagnosis not present

## 2021-01-27 DIAGNOSIS — B351 Tinea unguium: Secondary | ICD-10-CM

## 2021-01-27 DIAGNOSIS — M79676 Pain in unspecified toe(s): Secondary | ICD-10-CM

## 2021-01-27 DIAGNOSIS — E119 Type 2 diabetes mellitus without complications: Secondary | ICD-10-CM | POA: Diagnosis not present

## 2021-01-27 NOTE — Progress Notes (Signed)
This patient returns to my office for at risk foot care.  This patient requires this care by a professional since this patient will be at risk due to having diabetes, coagulation disorder and PVD.  This patient is unable to cut nails himself since the patient cannot reach his nails.These nails are painful walking and wearing shoes.  Patient says he injured his long second toenail putting on his compression hose.This patient presents for at risk foot care today.  General Appearance  Alert, conversant and in no acute stress.  Vascular  Dorsalis pedis and posterior tibial  pulses are weakly  palpable  bilaterally.  Capillary return is within normal limits  bilaterally. Cold feet  Bilaterally.  Absent digital hair.  Neurologic  Senn-Weinstein monofilament wire test within normal limits  bilaterally. Muscle power within normal limits bilaterally.  Nails Thick disfigured discolored nails with subungual debris  from hallux to fifth toes bilaterally. No evidence of bacterial infection or drainage bilaterally.  Orthopedic  No limitations of motion  feet .  No crepitus or effusions noted.  IPJ hallux malleus right foot.  Skin  normotropic skin with no porokeratosis noted bilaterally.  No signs of infections or ulcers noted.     Onychomycosis  Pain in right toes  Pain in left toes  Consent was obtained for treatment procedures.   Mechanical debridement of nails 1-5  bilaterally performed with a nail nipper.  Filed with dremel without incident.    Return office visit  10 weeks                   Told patient to return for periodic foot care and evaluation due to potential at risk complications.   Gardiner Barefoot DPM

## 2021-02-16 DIAGNOSIS — I10 Essential (primary) hypertension: Secondary | ICD-10-CM | POA: Diagnosis not present

## 2021-02-16 DIAGNOSIS — E119 Type 2 diabetes mellitus without complications: Secondary | ICD-10-CM | POA: Diagnosis not present

## 2021-02-16 DIAGNOSIS — I739 Peripheral vascular disease, unspecified: Secondary | ICD-10-CM | POA: Diagnosis not present

## 2021-02-23 DIAGNOSIS — E039 Hypothyroidism, unspecified: Secondary | ICD-10-CM | POA: Diagnosis not present

## 2021-02-23 DIAGNOSIS — I10 Essential (primary) hypertension: Secondary | ICD-10-CM | POA: Diagnosis not present

## 2021-02-23 DIAGNOSIS — E119 Type 2 diabetes mellitus without complications: Secondary | ICD-10-CM | POA: Diagnosis not present

## 2021-02-23 DIAGNOSIS — G4733 Obstructive sleep apnea (adult) (pediatric): Secondary | ICD-10-CM | POA: Diagnosis not present

## 2021-02-23 DIAGNOSIS — J439 Emphysema, unspecified: Secondary | ICD-10-CM | POA: Diagnosis not present

## 2021-02-23 DIAGNOSIS — D692 Other nonthrombocytopenic purpura: Secondary | ICD-10-CM | POA: Diagnosis not present

## 2021-02-23 DIAGNOSIS — E782 Mixed hyperlipidemia: Secondary | ICD-10-CM | POA: Diagnosis not present

## 2021-02-23 DIAGNOSIS — I739 Peripheral vascular disease, unspecified: Secondary | ICD-10-CM | POA: Diagnosis not present

## 2021-02-23 DIAGNOSIS — M48062 Spinal stenosis, lumbar region with neurogenic claudication: Secondary | ICD-10-CM | POA: Diagnosis not present

## 2021-02-23 DIAGNOSIS — I25710 Atherosclerosis of autologous vein coronary artery bypass graft(s) with unstable angina pectoris: Secondary | ICD-10-CM | POA: Diagnosis not present

## 2021-02-23 DIAGNOSIS — D508 Other iron deficiency anemias: Secondary | ICD-10-CM | POA: Diagnosis not present

## 2021-02-23 DIAGNOSIS — I5022 Chronic systolic (congestive) heart failure: Secondary | ICD-10-CM | POA: Diagnosis not present

## 2021-03-18 DIAGNOSIS — I25708 Atherosclerosis of coronary artery bypass graft(s), unspecified, with other forms of angina pectoris: Secondary | ICD-10-CM | POA: Diagnosis not present

## 2021-03-18 DIAGNOSIS — Z7902 Long term (current) use of antithrombotics/antiplatelets: Secondary | ICD-10-CM | POA: Diagnosis not present

## 2021-03-18 DIAGNOSIS — I878 Other specified disorders of veins: Secondary | ICD-10-CM | POA: Diagnosis not present

## 2021-03-18 DIAGNOSIS — I739 Peripheral vascular disease, unspecified: Secondary | ICD-10-CM | POA: Diagnosis not present

## 2021-03-18 DIAGNOSIS — I110 Hypertensive heart disease with heart failure: Secondary | ICD-10-CM | POA: Diagnosis not present

## 2021-03-18 DIAGNOSIS — Z7982 Long term (current) use of aspirin: Secondary | ICD-10-CM | POA: Diagnosis not present

## 2021-03-18 DIAGNOSIS — G4733 Obstructive sleep apnea (adult) (pediatric): Secondary | ICD-10-CM | POA: Diagnosis not present

## 2021-03-18 DIAGNOSIS — I10 Essential (primary) hypertension: Secondary | ICD-10-CM | POA: Diagnosis not present

## 2021-03-18 DIAGNOSIS — J449 Chronic obstructive pulmonary disease, unspecified: Secondary | ICD-10-CM | POA: Diagnosis not present

## 2021-03-18 DIAGNOSIS — Z87891 Personal history of nicotine dependence: Secondary | ICD-10-CM | POA: Diagnosis not present

## 2021-03-18 DIAGNOSIS — D692 Other nonthrombocytopenic purpura: Secondary | ICD-10-CM | POA: Diagnosis not present

## 2021-03-18 DIAGNOSIS — I25718 Atherosclerosis of autologous vein coronary artery bypass graft(s) with other forms of angina pectoris: Secondary | ICD-10-CM | POA: Diagnosis not present

## 2021-03-28 DIAGNOSIS — H6123 Impacted cerumen, bilateral: Secondary | ICD-10-CM | POA: Diagnosis not present

## 2021-03-28 DIAGNOSIS — Z8521 Personal history of malignant neoplasm of larynx: Secondary | ICD-10-CM | POA: Diagnosis not present

## 2021-04-14 ENCOUNTER — Other Ambulatory Visit: Payer: Self-pay

## 2021-04-14 ENCOUNTER — Ambulatory Visit: Payer: PPO | Admitting: Podiatry

## 2021-04-14 ENCOUNTER — Encounter: Payer: Self-pay | Admitting: Podiatry

## 2021-04-14 DIAGNOSIS — B351 Tinea unguium: Secondary | ICD-10-CM

## 2021-04-14 DIAGNOSIS — D689 Coagulation defect, unspecified: Secondary | ICD-10-CM

## 2021-04-14 DIAGNOSIS — M79676 Pain in unspecified toe(s): Secondary | ICD-10-CM | POA: Diagnosis not present

## 2021-04-14 DIAGNOSIS — E119 Type 2 diabetes mellitus without complications: Secondary | ICD-10-CM

## 2021-04-14 DIAGNOSIS — L989 Disorder of the skin and subcutaneous tissue, unspecified: Secondary | ICD-10-CM

## 2021-04-14 NOTE — Progress Notes (Addendum)
This patient returns to my office for at risk foot care.  This patient requires this care by a professional since this patient will be at risk due to having diabetes, coagulation disorder and PVD.  This patient is unable to cut nails himself since the patient cannot reach his nails.These nails are painful walking and wearing shoes. Patient has callus on tip of right big toe.This patient presents for at risk foot care today.  General Appearance  Alert, conversant and in no acute stress.  Vascular  Dorsalis pedis and posterior tibial  pulses are weakly  palpable  bilaterally.  Capillary return is within normal limits  bilaterally. Cold feet  Bilaterally.  Absent digital hair.  Neurologic  Senn-Weinstein monofilament wire test within normal limits  bilaterally. Muscle power within normal limits bilaterally.  Nails Thick disfigured discolored nails with subungual debris  from hallux to fifth toes bilaterally. No evidence of bacterial infection or drainage bilaterally.  Orthopedic  No limitations of motion  feet .  No crepitus or effusions noted.  IPJ hallux malleus right foot.  Skin  normotropic skin with no porokeratosis noted bilaterally.  No signs of infections or ulcers noted.  Callus distal aspect right hallux.   Onychomycosis  Pain in right toes  Pain in left toes  Callus right hallux  Consent was obtained for treatment procedures.   Mechanical debridement of nails 1-5  bilaterally performed with a nail nipper.  Filed with dremel without incident. Debride callus with # 15 blade.   Return office visit  10 weeks                   Told patient to return for periodic foot care and evaluation due to potential at risk complications.   Gardiner Barefoot DPM

## 2021-06-02 IMAGING — CT CT ENTEROGRAPHY (ABD-PELV W/ CM)
2 of 6 series · 16 of 46 positions shown, 18 images · IV contrast (APPLIED)
Comparison: CT 06/25/2020

CLINICAL DATA: Abdominal pain with bloating and diarrhea. Lap
surgery 07/15/20 removed scar tissue away from small
intestine.^125mL OMNIPAQUE IOHEXOL 300 MG/ML SOLN

EXAM:
CT ABDOMEN AND PELVIS WITH CONTRAST (ENTEROGRAPHY)
TECHNIQUE: Multidetector CT of the abdomen and pelvis during bolus
administration of intravenous contrast. Negative oral contrast was
given.
CONTRAST:  125mL OMNIPAQUE IOHEXOL 300 MG/ML  SOLN

[Series 3: entero thins (person_name) · axial · 0.95mm/px · z∈[-390,+94]mm · 13 of 276 slices shown, 15 images]
[im 17/276  soft-tissue]
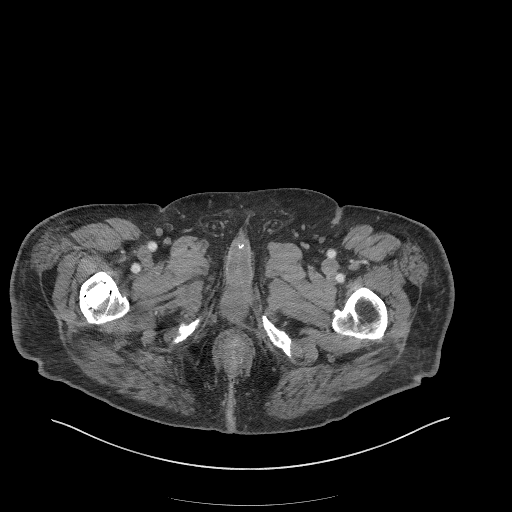
[im 17/276  bone]
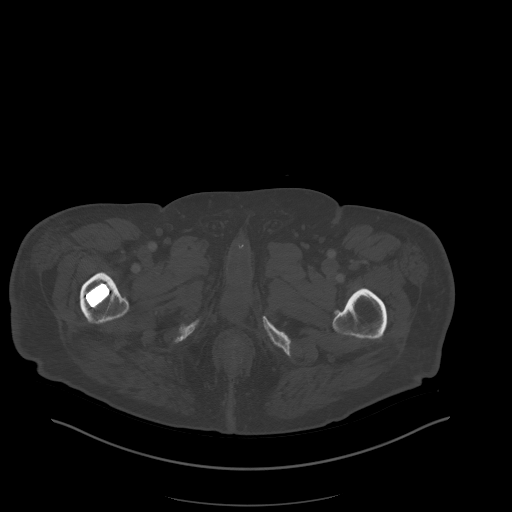
[im 33/276  soft-tissue]
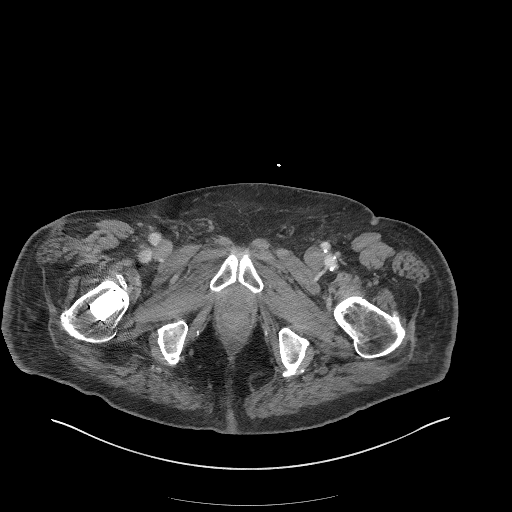
[im 65/276  soft-tissue]
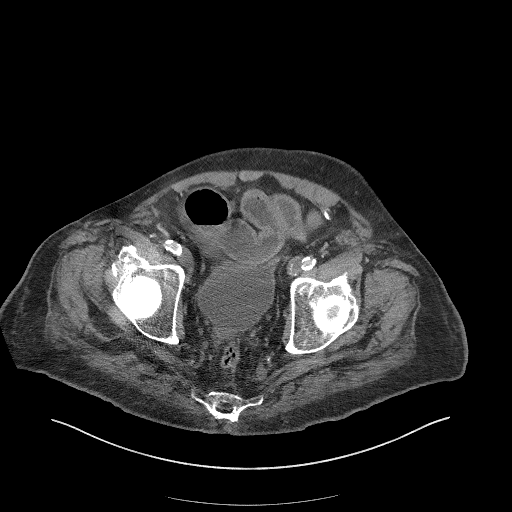
[im 81/276  soft-tissue]
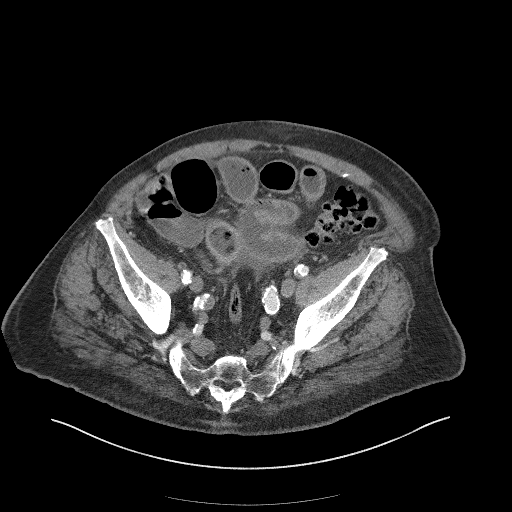
[im 98/276  soft-tissue]
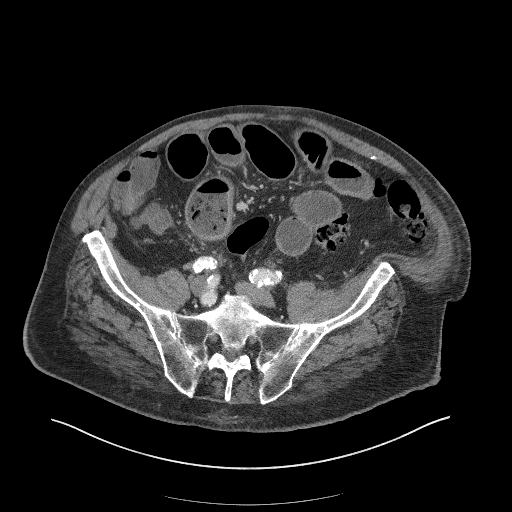
[im 114/276  soft-tissue]
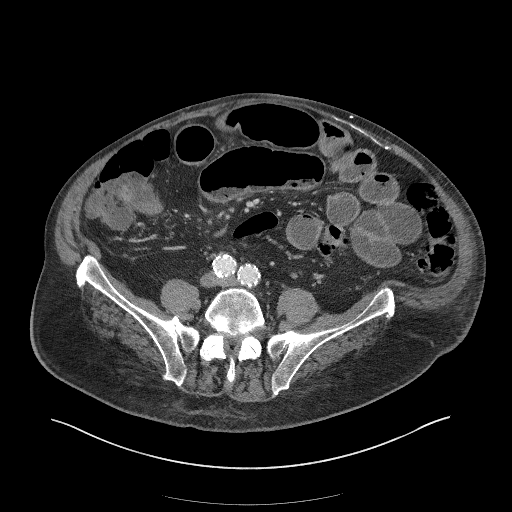
[im 146/276  soft-tissue]
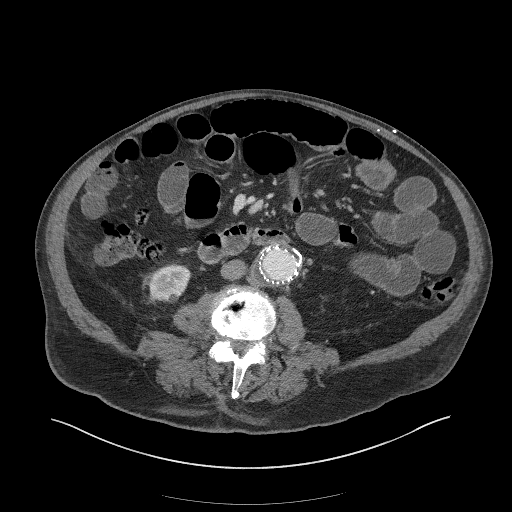
[im 162/276  soft-tissue]
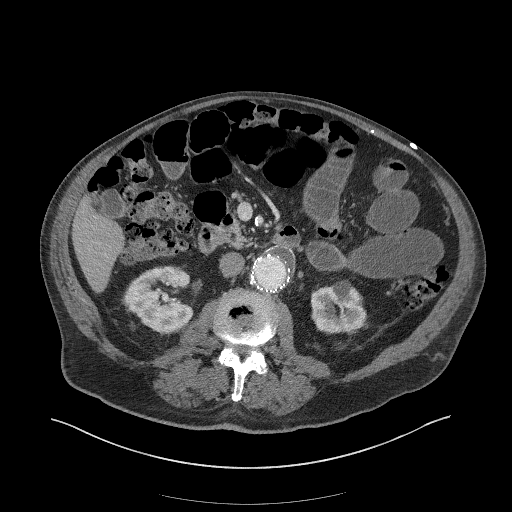
[im 178/276  soft-tissue]
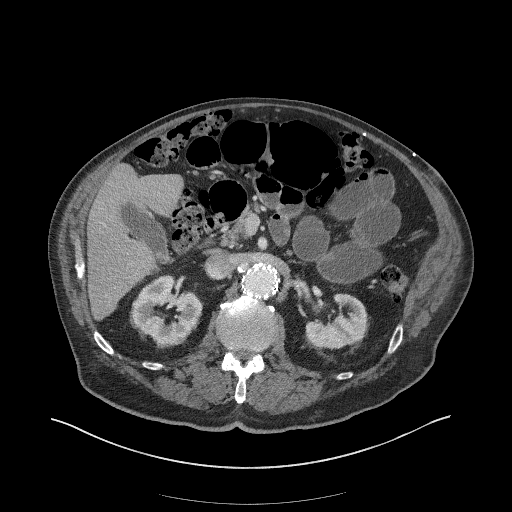
[im 178/276  bone]
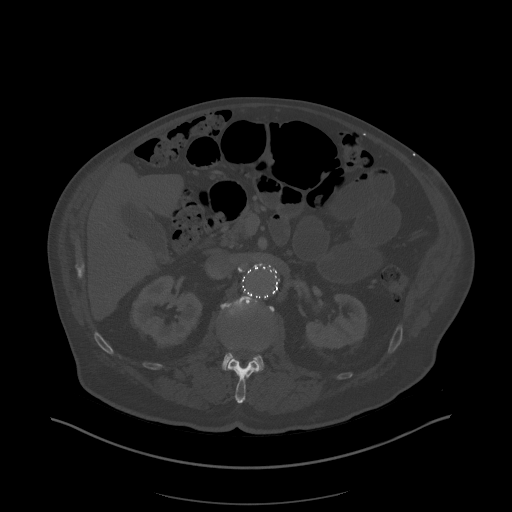
[im 195/276  soft-tissue]
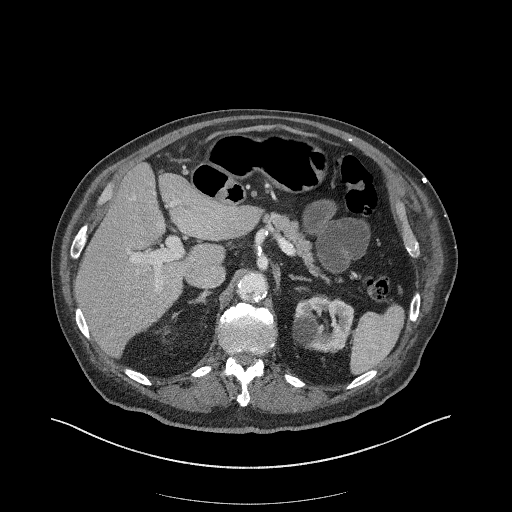
[im 211/276  soft-tissue]
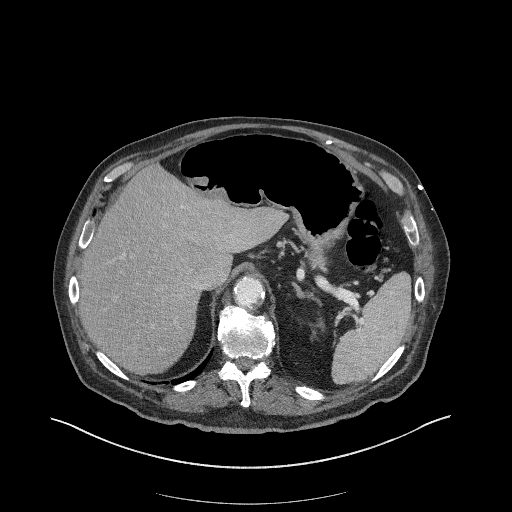
[im 243/276  soft-tissue]
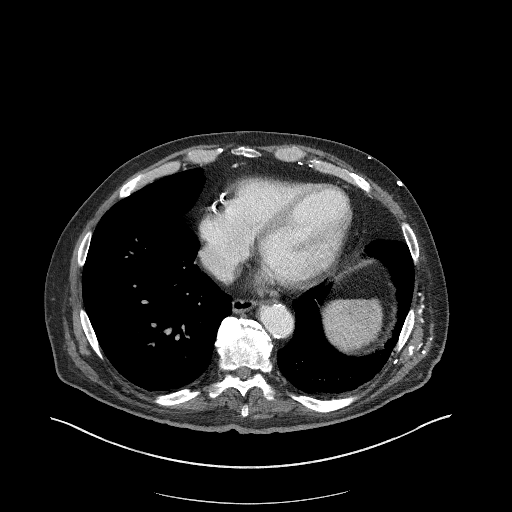
[im 259/276  soft-tissue]
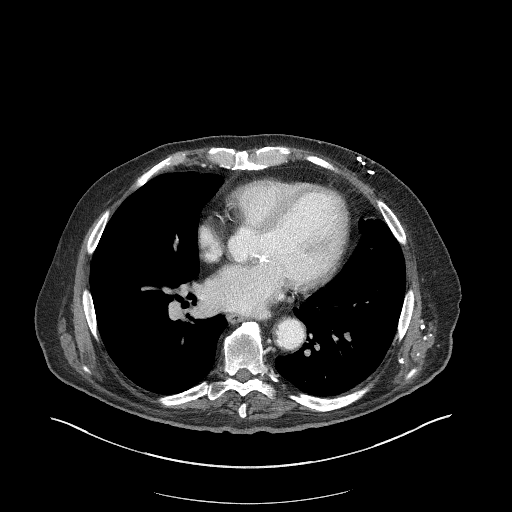

[Series 5: coronal · coronal · 0.91mm/px · 3 of 119 slices shown]
[im 40/119  soft-tissue]
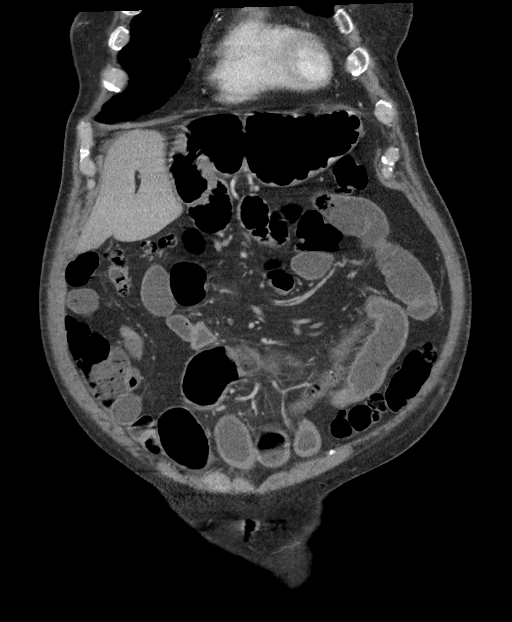
[im 53/119  soft-tissue]
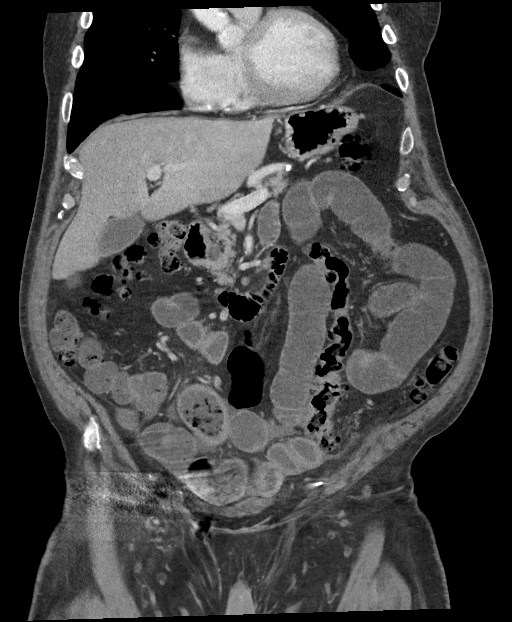
[im 66/119  soft-tissue]
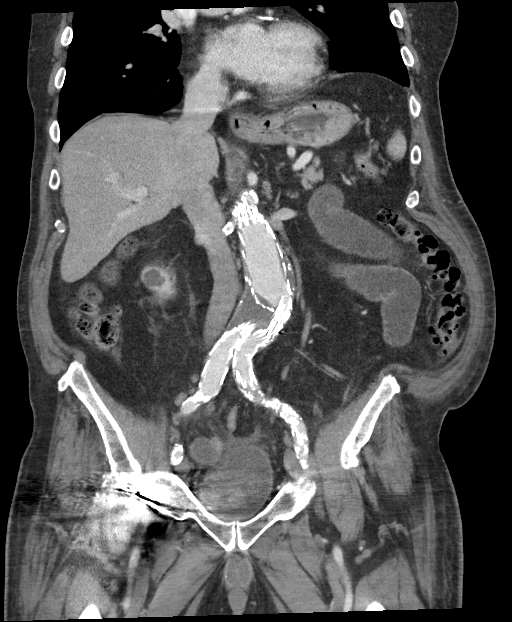

[16 of 46 positions shown; findings below may reference images not displayed]

FINDINGS: Lower chest: Lung bases are clear.

Hepatobiliary: No focal hepatic lesion. No biliary duct dilatation.
Common bile duct is normal.

Pancreas: Pancreas is normal. No ductal dilatation. No pancreatic
inflammation.

Spleen: 2 enhancing lesions in the spleen are unchanged.

Adrenals/urinary tract: Adrenal glands normal. Benign nonenhancing
renal cysts noted. Bilateral renal cortical thinning. Ureters and
bladder normal.

Stomach/Bowel: The GI tract is distended by negative oral contrast.

Normal rugal folds in the stomach.  Duodenum is normal.

No dilated loops of large or small bowel. In the central lower
abdomen/upper pelvis there is a focus of small bowel stricturing
seen on image 190/3. This segment of bowel stricturing is short
measuring 1.1 cm. There is mild fecalization of enteric contents
proximal to this focal stricturing.

Additionally there is small amount of free fluid within the
mesentery adjacent to the stricturing. Mild bowel wall thickening
adjacent loop of small bowel (image 190/3).

Cecum is normal. Terminal ileum is normal. The distal small bowel is
relatively normal caliber.

The ascending, transverse and descending colon are normal. The colon
is relatively devoid of content. Rectum normal.

Vascular/Lymphatic: Abdominal aortic/bi-iliac stent graft noted. No
abdominal adenopathy

Reproductive: Unremarkable

Other: No pneumatosis or free air.

Musculoskeletal: No aggressive osseous lesion.
IMPRESSION: 1. Persistent focus of stricturing in the ileum with mild/partial
mechanical obstruction with fecalization of enteric contents
proximal to the stricturing. No evidence of high-grade obstruction.
No bowel dilatation.
2. Mild inflammation associated the strictured bowel with small
amount free fluid and mucosal thickening adjacent loop of small
bowel.
3. Colon appears normal.  Stomach appears normal.

## 2021-06-10 DIAGNOSIS — Z7982 Long term (current) use of aspirin: Secondary | ICD-10-CM | POA: Diagnosis not present

## 2021-06-10 DIAGNOSIS — E1151 Type 2 diabetes mellitus with diabetic peripheral angiopathy without gangrene: Secondary | ICD-10-CM | POA: Diagnosis not present

## 2021-06-10 DIAGNOSIS — D692 Other nonthrombocytopenic purpura: Secondary | ICD-10-CM | POA: Diagnosis not present

## 2021-06-10 DIAGNOSIS — I110 Hypertensive heart disease with heart failure: Secondary | ICD-10-CM | POA: Diagnosis not present

## 2021-06-10 DIAGNOSIS — I25708 Atherosclerosis of coronary artery bypass graft(s), unspecified, with other forms of angina pectoris: Secondary | ICD-10-CM | POA: Diagnosis not present

## 2021-06-10 DIAGNOSIS — G4733 Obstructive sleep apnea (adult) (pediatric): Secondary | ICD-10-CM | POA: Diagnosis not present

## 2021-06-10 DIAGNOSIS — Z6833 Body mass index (BMI) 33.0-33.9, adult: Secondary | ICD-10-CM | POA: Diagnosis not present

## 2021-06-10 DIAGNOSIS — I878 Other specified disorders of veins: Secondary | ICD-10-CM | POA: Diagnosis not present

## 2021-06-10 DIAGNOSIS — I5022 Chronic systolic (congestive) heart failure: Secondary | ICD-10-CM | POA: Diagnosis not present

## 2021-06-10 DIAGNOSIS — J449 Chronic obstructive pulmonary disease, unspecified: Secondary | ICD-10-CM | POA: Diagnosis not present

## 2021-06-10 DIAGNOSIS — Z8679 Personal history of other diseases of the circulatory system: Secondary | ICD-10-CM | POA: Diagnosis not present

## 2021-06-10 DIAGNOSIS — Z7902 Long term (current) use of antithrombotics/antiplatelets: Secondary | ICD-10-CM | POA: Diagnosis not present

## 2021-06-30 ENCOUNTER — Other Ambulatory Visit: Payer: Self-pay

## 2021-06-30 ENCOUNTER — Encounter: Payer: Self-pay | Admitting: Podiatry

## 2021-06-30 ENCOUNTER — Ambulatory Visit: Payer: PPO | Admitting: Podiatry

## 2021-06-30 DIAGNOSIS — M79676 Pain in unspecified toe(s): Secondary | ICD-10-CM | POA: Diagnosis not present

## 2021-06-30 DIAGNOSIS — E119 Type 2 diabetes mellitus without complications: Secondary | ICD-10-CM | POA: Diagnosis not present

## 2021-06-30 DIAGNOSIS — B351 Tinea unguium: Secondary | ICD-10-CM

## 2021-06-30 DIAGNOSIS — D689 Coagulation defect, unspecified: Secondary | ICD-10-CM

## 2021-06-30 NOTE — Progress Notes (Signed)
This patient returns to my office for at risk foot care.  This patient requires this care by a professional since this patient will be at risk due to having diabetes, coagulation disorder and PVD.  This patient is unable to cut nails himself since the patient cannot reach his nails.These nails are painful walking and wearing shoes. Patient has callus on tip of right big toe.This patient presents for at risk foot care today.  General Appearance  Alert, conversant and in no acute stress.  Vascular  Dorsalis pedis and posterior tibial  pulses are weakly  palpable  bilaterally.  Capillary return is within normal limits  bilaterally. Cold feet  Bilaterally.  Absent digital hair.  Neurologic  Senn-Weinstein monofilament wire test within normal limits  bilaterally. Muscle power within normal limits bilaterally.  Nails Thick disfigured discolored nails with subungual debris  from hallux to fifth toes bilaterally. No evidence of bacterial infection or drainage bilaterally.  Orthopedic  No limitations of motion  feet .  No crepitus or effusions noted.  IPJ hallux malleus right foot.  Skin  normotropic skin with no porokeratosis noted bilaterally.  No signs of infections or ulcers noted.    Onychomycosis  Pain in right toes  Pain in left toes  Hallux  Malleus  B/l.  Consent was obtained for treatment procedures.   Mechanical debridement of nails 1-5  bilaterally performed with a nail nipper.  Filed with dremel without incident.    Return office visit  10 weeks                   Told patient to return for periodic foot care and evaluation due to potential at risk complications.   Leafy Motsinger DPM  

## 2021-07-20 DIAGNOSIS — I739 Peripheral vascular disease, unspecified: Secondary | ICD-10-CM | POA: Diagnosis not present

## 2021-07-20 DIAGNOSIS — J439 Emphysema, unspecified: Secondary | ICD-10-CM | POA: Diagnosis not present

## 2021-07-20 DIAGNOSIS — I5022 Chronic systolic (congestive) heart failure: Secondary | ICD-10-CM | POA: Diagnosis not present

## 2021-07-20 DIAGNOSIS — E782 Mixed hyperlipidemia: Secondary | ICD-10-CM | POA: Diagnosis not present

## 2021-07-20 DIAGNOSIS — I714 Abdominal aortic aneurysm, without rupture, unspecified: Secondary | ICD-10-CM | POA: Diagnosis not present

## 2021-07-20 DIAGNOSIS — E1159 Type 2 diabetes mellitus with other circulatory complications: Secondary | ICD-10-CM | POA: Diagnosis not present

## 2021-07-20 DIAGNOSIS — G4733 Obstructive sleep apnea (adult) (pediatric): Secondary | ICD-10-CM | POA: Diagnosis not present

## 2021-07-20 DIAGNOSIS — K219 Gastro-esophageal reflux disease without esophagitis: Secondary | ICD-10-CM | POA: Diagnosis not present

## 2021-07-20 DIAGNOSIS — I208 Other forms of angina pectoris: Secondary | ICD-10-CM | POA: Diagnosis not present

## 2021-07-20 DIAGNOSIS — I10 Essential (primary) hypertension: Secondary | ICD-10-CM | POA: Diagnosis not present

## 2021-07-20 DIAGNOSIS — R0602 Shortness of breath: Secondary | ICD-10-CM | POA: Diagnosis not present

## 2021-07-20 DIAGNOSIS — I25710 Atherosclerosis of autologous vein coronary artery bypass graft(s) with unstable angina pectoris: Secondary | ICD-10-CM | POA: Diagnosis not present

## 2021-09-08 ENCOUNTER — Ambulatory Visit: Payer: PPO | Admitting: Podiatry

## 2021-09-08 ENCOUNTER — Encounter: Payer: Self-pay | Admitting: Podiatry

## 2021-09-08 DIAGNOSIS — D689 Coagulation defect, unspecified: Secondary | ICD-10-CM

## 2021-09-08 DIAGNOSIS — E119 Type 2 diabetes mellitus without complications: Secondary | ICD-10-CM

## 2021-09-08 DIAGNOSIS — B351 Tinea unguium: Secondary | ICD-10-CM | POA: Diagnosis not present

## 2021-09-08 DIAGNOSIS — M79676 Pain in unspecified toe(s): Secondary | ICD-10-CM | POA: Diagnosis not present

## 2021-09-08 NOTE — Progress Notes (Signed)
This patient returns to my office for at risk foot care.  This patient requires this care by a professional since this patient will be at risk due to having diabetes, coagulation disorder and PVD.  This patient is unable to cut nails himself since the patient cannot reach his nails.These nails are painful walking and wearing shoes. Patient has callus on tip of right big toe.This patient presents for at risk foot care today.  General Appearance  Alert, conversant and in no acute stress.  Vascular  Dorsalis pedis and posterior tibial  pulses are weakly  palpable  bilaterally.  Capillary return is within normal limits  bilaterally. Cold feet  Bilaterally.  Absent digital hair.  Neurologic  Senn-Weinstein monofilament wire test within normal limits  bilaterally. Muscle power within normal limits bilaterally.  Nails Thick disfigured discolored nails with subungual debris  from hallux to fifth toes bilaterally. No evidence of bacterial infection or drainage bilaterally.  Orthopedic  No limitations of motion  feet .  No crepitus or effusions noted.  IPJ hallux malleus right foot.  Skin  normotropic skin with no porokeratosis noted bilaterally.  No signs of infections or ulcers noted.    Onychomycosis  Pain in right toes  Pain in left toes  Hallux  Malleus  B/l.  Consent was obtained for treatment procedures.   Mechanical debridement of nails 1-5  bilaterally performed with a nail nipper.  Filed with dremel without incident.    Return office visit  10 weeks                   Told patient to return for periodic foot care and evaluation due to potential at risk complications.   Markasia Carrol DPM  

## 2021-09-26 DIAGNOSIS — H6123 Impacted cerumen, bilateral: Secondary | ICD-10-CM | POA: Diagnosis not present

## 2021-09-26 DIAGNOSIS — Z8521 Personal history of malignant neoplasm of larynx: Secondary | ICD-10-CM | POA: Diagnosis not present

## 2021-09-29 DIAGNOSIS — E119 Type 2 diabetes mellitus without complications: Secondary | ICD-10-CM | POA: Diagnosis not present

## 2021-09-29 DIAGNOSIS — E782 Mixed hyperlipidemia: Secondary | ICD-10-CM | POA: Diagnosis not present

## 2021-09-29 DIAGNOSIS — E039 Hypothyroidism, unspecified: Secondary | ICD-10-CM | POA: Diagnosis not present

## 2021-09-29 DIAGNOSIS — D508 Other iron deficiency anemias: Secondary | ICD-10-CM | POA: Diagnosis not present

## 2021-10-06 DIAGNOSIS — E782 Mixed hyperlipidemia: Secondary | ICD-10-CM | POA: Diagnosis not present

## 2021-10-06 DIAGNOSIS — E538 Deficiency of other specified B group vitamins: Secondary | ICD-10-CM | POA: Diagnosis not present

## 2021-10-06 DIAGNOSIS — I739 Peripheral vascular disease, unspecified: Secondary | ICD-10-CM | POA: Diagnosis not present

## 2021-10-06 DIAGNOSIS — E119 Type 2 diabetes mellitus without complications: Secondary | ICD-10-CM | POA: Diagnosis not present

## 2021-10-06 DIAGNOSIS — D692 Other nonthrombocytopenic purpura: Secondary | ICD-10-CM | POA: Diagnosis not present

## 2021-10-06 DIAGNOSIS — Z8521 Personal history of malignant neoplasm of larynx: Secondary | ICD-10-CM | POA: Diagnosis not present

## 2021-10-06 DIAGNOSIS — J439 Emphysema, unspecified: Secondary | ICD-10-CM | POA: Diagnosis not present

## 2021-10-06 DIAGNOSIS — D508 Other iron deficiency anemias: Secondary | ICD-10-CM | POA: Diagnosis not present

## 2021-10-06 DIAGNOSIS — I5022 Chronic systolic (congestive) heart failure: Secondary | ICD-10-CM | POA: Diagnosis not present

## 2021-10-06 DIAGNOSIS — I714 Abdominal aortic aneurysm, without rupture, unspecified: Secondary | ICD-10-CM | POA: Diagnosis not present

## 2021-10-06 DIAGNOSIS — I10 Essential (primary) hypertension: Secondary | ICD-10-CM | POA: Diagnosis not present

## 2021-10-06 DIAGNOSIS — Z0000 Encounter for general adult medical examination without abnormal findings: Secondary | ICD-10-CM | POA: Diagnosis not present

## 2021-10-20 DIAGNOSIS — M3501 Sicca syndrome with keratoconjunctivitis: Secondary | ICD-10-CM | POA: Diagnosis not present

## 2021-11-07 DIAGNOSIS — E039 Hypothyroidism, unspecified: Secondary | ICD-10-CM | POA: Diagnosis not present

## 2021-11-24 ENCOUNTER — Encounter: Payer: Self-pay | Admitting: Podiatry

## 2021-11-24 ENCOUNTER — Ambulatory Visit: Payer: PPO | Admitting: Podiatry

## 2021-11-24 DIAGNOSIS — E119 Type 2 diabetes mellitus without complications: Secondary | ICD-10-CM

## 2021-11-24 DIAGNOSIS — B351 Tinea unguium: Secondary | ICD-10-CM | POA: Diagnosis not present

## 2021-11-24 DIAGNOSIS — I110 Hypertensive heart disease with heart failure: Secondary | ICD-10-CM | POA: Diagnosis not present

## 2021-11-24 DIAGNOSIS — G4733 Obstructive sleep apnea (adult) (pediatric): Secondary | ICD-10-CM | POA: Diagnosis not present

## 2021-11-24 DIAGNOSIS — I5022 Chronic systolic (congestive) heart failure: Secondary | ICD-10-CM | POA: Diagnosis not present

## 2021-11-24 DIAGNOSIS — D689 Coagulation defect, unspecified: Secondary | ICD-10-CM | POA: Diagnosis not present

## 2021-11-24 DIAGNOSIS — M79676 Pain in unspecified toe(s): Secondary | ICD-10-CM

## 2021-11-24 DIAGNOSIS — E1151 Type 2 diabetes mellitus with diabetic peripheral angiopathy without gangrene: Secondary | ICD-10-CM | POA: Diagnosis not present

## 2021-11-24 DIAGNOSIS — Z7982 Long term (current) use of aspirin: Secondary | ICD-10-CM | POA: Diagnosis not present

## 2021-11-24 DIAGNOSIS — D692 Other nonthrombocytopenic purpura: Secondary | ICD-10-CM | POA: Diagnosis not present

## 2021-11-24 DIAGNOSIS — Z6833 Body mass index (BMI) 33.0-33.9, adult: Secondary | ICD-10-CM | POA: Diagnosis not present

## 2021-11-24 NOTE — Progress Notes (Signed)
This patient returns to my office for at risk foot care.  This patient requires this care by a professional since this patient will be at risk due to having diabetes, coagulation disorder and PVD.  This patient is unable to cut nails himself since the patient cannot reach his nails.These nails are painful walking and wearing shoes. Patient has callus on tip of right big toe.This patient presents for at risk foot care today.  General Appearance  Alert, conversant and in no acute stress.  Vascular  Dorsalis pedis and posterior tibial  pulses are weakly  palpable  bilaterally.  Capillary return is within normal limits  bilaterally. Cold feet  Bilaterally.  Absent digital hair.  Neurologic  Senn-Weinstein monofilament wire test within normal limits  bilaterally. Muscle power within normal limits bilaterally.  Nails Thick disfigured discolored nails with subungual debris  from hallux to fifth toes bilaterally. No evidence of bacterial infection or drainage bilaterally.  Orthopedic  No limitations of motion  feet .  No crepitus or effusions noted.  IPJ hallux malleus right foot.  Skin  normotropic skin with no porokeratosis noted bilaterally.  No signs of infections or ulcers noted.    Onychomycosis  Pain in right toes  Pain in left toes  Hallux  Malleus  B/l.  Consent was obtained for treatment procedures.   Mechanical debridement of nails 1-5  bilaterally performed with a nail nipper.  Filed with dremel without incident.    Return office visit  10 weeks                   Told patient to return for periodic foot care and evaluation due to potential at risk complications.   Gardiner Barefoot DPM

## 2022-01-24 DIAGNOSIS — I5022 Chronic systolic (congestive) heart failure: Secondary | ICD-10-CM | POA: Diagnosis not present

## 2022-01-24 DIAGNOSIS — R9431 Abnormal electrocardiogram [ECG] [EKG]: Secondary | ICD-10-CM | POA: Diagnosis not present

## 2022-01-24 DIAGNOSIS — D692 Other nonthrombocytopenic purpura: Secondary | ICD-10-CM | POA: Diagnosis not present

## 2022-01-24 DIAGNOSIS — R55 Syncope and collapse: Secondary | ICD-10-CM | POA: Diagnosis not present

## 2022-01-24 DIAGNOSIS — J439 Emphysema, unspecified: Secondary | ICD-10-CM | POA: Diagnosis not present

## 2022-01-24 DIAGNOSIS — R600 Localized edema: Secondary | ICD-10-CM | POA: Diagnosis not present

## 2022-01-24 DIAGNOSIS — I714 Abdominal aortic aneurysm, without rupture, unspecified: Secondary | ICD-10-CM | POA: Diagnosis not present

## 2022-01-24 DIAGNOSIS — I739 Peripheral vascular disease, unspecified: Secondary | ICD-10-CM | POA: Diagnosis not present

## 2022-01-24 DIAGNOSIS — D508 Other iron deficiency anemias: Secondary | ICD-10-CM | POA: Diagnosis not present

## 2022-01-24 DIAGNOSIS — M48062 Spinal stenosis, lumbar region with neurogenic claudication: Secondary | ICD-10-CM | POA: Diagnosis not present

## 2022-01-24 DIAGNOSIS — I10 Essential (primary) hypertension: Secondary | ICD-10-CM | POA: Diagnosis not present

## 2022-01-24 DIAGNOSIS — M542 Cervicalgia: Secondary | ICD-10-CM | POA: Diagnosis not present

## 2022-01-24 DIAGNOSIS — E119 Type 2 diabetes mellitus without complications: Secondary | ICD-10-CM | POA: Diagnosis not present

## 2022-01-24 DIAGNOSIS — E538 Deficiency of other specified B group vitamins: Secondary | ICD-10-CM | POA: Diagnosis not present

## 2022-02-06 DIAGNOSIS — M5136 Other intervertebral disc degeneration, lumbar region: Secondary | ICD-10-CM | POA: Diagnosis not present

## 2022-02-06 DIAGNOSIS — M5126 Other intervertebral disc displacement, lumbar region: Secondary | ICD-10-CM | POA: Diagnosis not present

## 2022-02-06 DIAGNOSIS — M48062 Spinal stenosis, lumbar region with neurogenic claudication: Secondary | ICD-10-CM | POA: Diagnosis not present

## 2022-02-06 DIAGNOSIS — M5416 Radiculopathy, lumbar region: Secondary | ICD-10-CM | POA: Diagnosis not present

## 2022-02-09 ENCOUNTER — Ambulatory Visit: Payer: PPO | Admitting: Podiatry

## 2022-02-14 DIAGNOSIS — M48062 Spinal stenosis, lumbar region with neurogenic claudication: Secondary | ICD-10-CM | POA: Diagnosis not present

## 2022-02-14 DIAGNOSIS — M5416 Radiculopathy, lumbar region: Secondary | ICD-10-CM | POA: Diagnosis not present

## 2022-02-14 DIAGNOSIS — M5126 Other intervertebral disc displacement, lumbar region: Secondary | ICD-10-CM | POA: Diagnosis not present

## 2022-02-16 DIAGNOSIS — R55 Syncope and collapse: Secondary | ICD-10-CM | POA: Diagnosis not present

## 2022-02-21 DIAGNOSIS — G4733 Obstructive sleep apnea (adult) (pediatric): Secondary | ICD-10-CM | POA: Diagnosis not present

## 2022-02-21 DIAGNOSIS — R0602 Shortness of breath: Secondary | ICD-10-CM | POA: Diagnosis not present

## 2022-02-21 DIAGNOSIS — I714 Abdominal aortic aneurysm, without rupture, unspecified: Secondary | ICD-10-CM | POA: Diagnosis not present

## 2022-02-21 DIAGNOSIS — E782 Mixed hyperlipidemia: Secondary | ICD-10-CM | POA: Diagnosis not present

## 2022-02-21 DIAGNOSIS — I25710 Atherosclerosis of autologous vein coronary artery bypass graft(s) with unstable angina pectoris: Secondary | ICD-10-CM | POA: Diagnosis not present

## 2022-02-21 DIAGNOSIS — I739 Peripheral vascular disease, unspecified: Secondary | ICD-10-CM | POA: Diagnosis not present

## 2022-02-21 DIAGNOSIS — I5022 Chronic systolic (congestive) heart failure: Secondary | ICD-10-CM | POA: Diagnosis not present

## 2022-02-21 DIAGNOSIS — K219 Gastro-esophageal reflux disease without esophagitis: Secondary | ICD-10-CM | POA: Diagnosis not present

## 2022-02-21 DIAGNOSIS — I10 Essential (primary) hypertension: Secondary | ICD-10-CM | POA: Diagnosis not present

## 2022-02-21 DIAGNOSIS — J439 Emphysema, unspecified: Secondary | ICD-10-CM | POA: Diagnosis not present

## 2022-02-21 DIAGNOSIS — E1159 Type 2 diabetes mellitus with other circulatory complications: Secondary | ICD-10-CM | POA: Diagnosis not present

## 2022-02-22 DIAGNOSIS — M48062 Spinal stenosis, lumbar region with neurogenic claudication: Secondary | ICD-10-CM | POA: Diagnosis not present

## 2022-02-22 DIAGNOSIS — M5136 Other intervertebral disc degeneration, lumbar region: Secondary | ICD-10-CM | POA: Diagnosis not present

## 2022-02-22 DIAGNOSIS — M5126 Other intervertebral disc displacement, lumbar region: Secondary | ICD-10-CM | POA: Diagnosis not present

## 2022-02-22 DIAGNOSIS — M5416 Radiculopathy, lumbar region: Secondary | ICD-10-CM | POA: Diagnosis not present

## 2022-02-22 DIAGNOSIS — M542 Cervicalgia: Secondary | ICD-10-CM | POA: Diagnosis not present

## 2022-02-22 DIAGNOSIS — M5030 Other cervical disc degeneration, unspecified cervical region: Secondary | ICD-10-CM | POA: Diagnosis not present

## 2022-02-22 DIAGNOSIS — M5412 Radiculopathy, cervical region: Secondary | ICD-10-CM | POA: Diagnosis not present

## 2022-02-22 DIAGNOSIS — M48061 Spinal stenosis, lumbar region without neurogenic claudication: Secondary | ICD-10-CM | POA: Diagnosis not present

## 2022-02-23 ENCOUNTER — Ambulatory Visit: Payer: PPO | Admitting: Podiatry

## 2022-02-23 DIAGNOSIS — E119 Type 2 diabetes mellitus without complications: Secondary | ICD-10-CM

## 2022-02-23 DIAGNOSIS — Q666 Other congenital valgus deformities of feet: Secondary | ICD-10-CM | POA: Diagnosis not present

## 2022-02-23 DIAGNOSIS — B351 Tinea unguium: Secondary | ICD-10-CM

## 2022-02-23 NOTE — Progress Notes (Signed)
This patient returns to my office for at risk foot care.  This patient requires this care by a professional since this patient will be at risk due to having diabetes, coagulation disorder and PVD.  This patient is unable to cut nails himself since the patient cannot reach his nails.These nails are painful walking and wearing shoes. Patient has callus on tip of right big toe.patient also has secondary complaint of arch and heel pain.  He would like to discuss orthotics and offloading options.  General Appearance  Alert, conversant and in no acute stress.  Vascular  Dorsalis pedis and posterior tibial  pulses are weakly  palpable  bilaterally.  Capillary return is within normal limits  bilaterally. Cold feet  Bilaterally.  Absent digital hair.  Neurologic  Senn-Weinstein monofilament wire test within normal limits  bilaterally. Muscle power within normal limits bilaterally.  Nails Thick disfigured discolored nails with subungual debris  from hallux to fifth toes bilaterally. No evidence of bacterial infection or drainage bilaterally.  Orthopedic  No limitations of motion  feet .  No crepitus or effusions noted.  IPJ hallux malleus right foot.  Pain planovalgus foot structure noted calcaneovalgus to many toe signs unable to recruit the arch with dorsiflexion of the hallux.  Skin  normotropic skin with no porokeratosis noted bilaterally.  No signs of infections or ulcers noted.    Onychomycosis  Pain in right toes  Pain in left toes  Hallux  Malleus  B/l.  Consent was obtained for treatment procedures.   Mechanical debridement of nails 1-5  bilaterally performed with a nail nipper.  Filed with dremel without incident.    Return office visit  10 weeks                   Told patient to return for periodic foot care and evaluation due to potential at risk complications.  Pes planovalgus -I explained to patient the etiology of pes planovalgus and relationship with Planter fasciitis and various  treatment options were discussed.  Given patient foot structure in the setting of Planter fasciitis I believe patient will benefit from custom-made orthotics to help control the hindfoot motion support the arch of the foot and take the stress away from plantar fascial.  Patient agrees with the plan like to proceed with orthotics -Patient was casted for orthotics   Boneta Lucks D.P.M.

## 2022-02-28 DIAGNOSIS — Z87898 Personal history of other specified conditions: Secondary | ICD-10-CM | POA: Diagnosis not present

## 2022-03-06 DIAGNOSIS — J439 Emphysema, unspecified: Secondary | ICD-10-CM | POA: Diagnosis not present

## 2022-03-06 DIAGNOSIS — D508 Other iron deficiency anemias: Secondary | ICD-10-CM | POA: Diagnosis not present

## 2022-03-06 DIAGNOSIS — I739 Peripheral vascular disease, unspecified: Secondary | ICD-10-CM | POA: Diagnosis not present

## 2022-03-06 DIAGNOSIS — M109 Gout, unspecified: Secondary | ICD-10-CM | POA: Diagnosis not present

## 2022-03-06 DIAGNOSIS — I10 Essential (primary) hypertension: Secondary | ICD-10-CM | POA: Diagnosis not present

## 2022-03-06 DIAGNOSIS — K219 Gastro-esophageal reflux disease without esophagitis: Secondary | ICD-10-CM | POA: Diagnosis not present

## 2022-03-06 DIAGNOSIS — I714 Abdominal aortic aneurysm, without rupture, unspecified: Secondary | ICD-10-CM | POA: Diagnosis not present

## 2022-03-06 DIAGNOSIS — I5022 Chronic systolic (congestive) heart failure: Secondary | ICD-10-CM | POA: Diagnosis not present

## 2022-03-06 DIAGNOSIS — E119 Type 2 diabetes mellitus without complications: Secondary | ICD-10-CM | POA: Diagnosis not present

## 2022-03-06 DIAGNOSIS — E538 Deficiency of other specified B group vitamins: Secondary | ICD-10-CM | POA: Diagnosis not present

## 2022-03-06 DIAGNOSIS — D692 Other nonthrombocytopenic purpura: Secondary | ICD-10-CM | POA: Diagnosis not present

## 2022-03-06 DIAGNOSIS — I25710 Atherosclerosis of autologous vein coronary artery bypass graft(s) with unstable angina pectoris: Secondary | ICD-10-CM | POA: Diagnosis not present

## 2022-03-08 DIAGNOSIS — M5030 Other cervical disc degeneration, unspecified cervical region: Secondary | ICD-10-CM | POA: Diagnosis not present

## 2022-03-08 DIAGNOSIS — M6281 Muscle weakness (generalized): Secondary | ICD-10-CM | POA: Diagnosis not present

## 2022-03-08 DIAGNOSIS — M5412 Radiculopathy, cervical region: Secondary | ICD-10-CM | POA: Diagnosis not present

## 2022-03-08 DIAGNOSIS — M799 Soft tissue disorder, unspecified: Secondary | ICD-10-CM | POA: Diagnosis not present

## 2022-03-10 DIAGNOSIS — M5412 Radiculopathy, cervical region: Secondary | ICD-10-CM | POA: Diagnosis not present

## 2022-03-10 DIAGNOSIS — M5030 Other cervical disc degeneration, unspecified cervical region: Secondary | ICD-10-CM | POA: Diagnosis not present

## 2022-03-10 DIAGNOSIS — M6281 Muscle weakness (generalized): Secondary | ICD-10-CM | POA: Diagnosis not present

## 2022-03-10 DIAGNOSIS — M799 Soft tissue disorder, unspecified: Secondary | ICD-10-CM | POA: Diagnosis not present

## 2022-03-15 DIAGNOSIS — M5412 Radiculopathy, cervical region: Secondary | ICD-10-CM | POA: Diagnosis not present

## 2022-03-15 DIAGNOSIS — M5030 Other cervical disc degeneration, unspecified cervical region: Secondary | ICD-10-CM | POA: Diagnosis not present

## 2022-03-15 DIAGNOSIS — M799 Soft tissue disorder, unspecified: Secondary | ICD-10-CM | POA: Diagnosis not present

## 2022-03-15 DIAGNOSIS — M6281 Muscle weakness (generalized): Secondary | ICD-10-CM | POA: Diagnosis not present

## 2022-03-17 DIAGNOSIS — M5412 Radiculopathy, cervical region: Secondary | ICD-10-CM | POA: Diagnosis not present

## 2022-03-17 DIAGNOSIS — M5030 Other cervical disc degeneration, unspecified cervical region: Secondary | ICD-10-CM | POA: Diagnosis not present

## 2022-03-17 DIAGNOSIS — M6281 Muscle weakness (generalized): Secondary | ICD-10-CM | POA: Diagnosis not present

## 2022-03-17 DIAGNOSIS — M799 Soft tissue disorder, unspecified: Secondary | ICD-10-CM | POA: Diagnosis not present

## 2022-03-21 DIAGNOSIS — M5412 Radiculopathy, cervical region: Secondary | ICD-10-CM | POA: Diagnosis not present

## 2022-03-21 DIAGNOSIS — M799 Soft tissue disorder, unspecified: Secondary | ICD-10-CM | POA: Diagnosis not present

## 2022-03-21 DIAGNOSIS — M5030 Other cervical disc degeneration, unspecified cervical region: Secondary | ICD-10-CM | POA: Diagnosis not present

## 2022-03-21 DIAGNOSIS — M6281 Muscle weakness (generalized): Secondary | ICD-10-CM | POA: Diagnosis not present

## 2022-03-24 DIAGNOSIS — M6281 Muscle weakness (generalized): Secondary | ICD-10-CM | POA: Diagnosis not present

## 2022-03-24 DIAGNOSIS — M799 Soft tissue disorder, unspecified: Secondary | ICD-10-CM | POA: Diagnosis not present

## 2022-03-24 DIAGNOSIS — M5412 Radiculopathy, cervical region: Secondary | ICD-10-CM | POA: Diagnosis not present

## 2022-03-24 DIAGNOSIS — M5030 Other cervical disc degeneration, unspecified cervical region: Secondary | ICD-10-CM | POA: Diagnosis not present

## 2022-03-27 DIAGNOSIS — M5416 Radiculopathy, lumbar region: Secondary | ICD-10-CM | POA: Diagnosis not present

## 2022-03-27 DIAGNOSIS — M5136 Other intervertebral disc degeneration, lumbar region: Secondary | ICD-10-CM | POA: Diagnosis not present

## 2022-03-27 DIAGNOSIS — M48062 Spinal stenosis, lumbar region with neurogenic claudication: Secondary | ICD-10-CM | POA: Diagnosis not present

## 2022-03-27 DIAGNOSIS — M5126 Other intervertebral disc displacement, lumbar region: Secondary | ICD-10-CM | POA: Diagnosis not present

## 2022-03-28 DIAGNOSIS — M6281 Muscle weakness (generalized): Secondary | ICD-10-CM | POA: Diagnosis not present

## 2022-03-28 DIAGNOSIS — M5412 Radiculopathy, cervical region: Secondary | ICD-10-CM | POA: Diagnosis not present

## 2022-03-28 DIAGNOSIS — M5030 Other cervical disc degeneration, unspecified cervical region: Secondary | ICD-10-CM | POA: Diagnosis not present

## 2022-03-28 DIAGNOSIS — M799 Soft tissue disorder, unspecified: Secondary | ICD-10-CM | POA: Diagnosis not present

## 2022-04-12 DIAGNOSIS — M5412 Radiculopathy, cervical region: Secondary | ICD-10-CM | POA: Diagnosis not present

## 2022-04-12 DIAGNOSIS — M5030 Other cervical disc degeneration, unspecified cervical region: Secondary | ICD-10-CM | POA: Diagnosis not present

## 2022-04-12 DIAGNOSIS — M6281 Muscle weakness (generalized): Secondary | ICD-10-CM | POA: Diagnosis not present

## 2022-04-12 DIAGNOSIS — M799 Soft tissue disorder, unspecified: Secondary | ICD-10-CM | POA: Diagnosis not present

## 2022-04-14 DIAGNOSIS — M799 Soft tissue disorder, unspecified: Secondary | ICD-10-CM | POA: Diagnosis not present

## 2022-04-14 DIAGNOSIS — M5030 Other cervical disc degeneration, unspecified cervical region: Secondary | ICD-10-CM | POA: Diagnosis not present

## 2022-04-14 DIAGNOSIS — M5412 Radiculopathy, cervical region: Secondary | ICD-10-CM | POA: Diagnosis not present

## 2022-04-14 DIAGNOSIS — M6281 Muscle weakness (generalized): Secondary | ICD-10-CM | POA: Diagnosis not present

## 2022-04-19 ENCOUNTER — Other Ambulatory Visit: Payer: PPO

## 2022-04-20 DIAGNOSIS — H6123 Impacted cerumen, bilateral: Secondary | ICD-10-CM | POA: Diagnosis not present

## 2022-04-20 DIAGNOSIS — Z8521 Personal history of malignant neoplasm of larynx: Secondary | ICD-10-CM | POA: Diagnosis not present

## 2022-04-20 DIAGNOSIS — R053 Chronic cough: Secondary | ICD-10-CM | POA: Diagnosis not present

## 2022-04-21 ENCOUNTER — Ambulatory Visit (INDEPENDENT_AMBULATORY_CARE_PROVIDER_SITE_OTHER): Payer: PPO | Admitting: Podiatry

## 2022-04-21 DIAGNOSIS — Q666 Other congenital valgus deformities of feet: Secondary | ICD-10-CM | POA: Diagnosis not present

## 2022-04-21 NOTE — Progress Notes (Signed)
This patient returns to my office for at risk foot care.  This patient requires this care by a professional since this patient will be at risk due to having diabetes, coagulation disorder and PVD.  This patient is unable to cut nails himself since the patient cannot reach his nails.These nails are painful walking and wearing shoes. Patient has callus on tip of right big toe.patient also has secondary complaint of arch and heel pain.  He would like to discuss orthotics and offloading options.  General Appearance  Alert, conversant and in no acute stress.  Vascular  Dorsalis pedis and posterior tibial  pulses are weakly  palpable  bilaterally.  Capillary return is within normal limits  bilaterally. Cold feet  Bilaterally.  Absent digital hair.  Neurologic  Senn-Weinstein monofilament wire test within normal limits  bilaterally. Muscle power within normal limits bilaterally.  Nails Thick disfigured discolored nails with subungual debris  from hallux to fifth toes bilaterally. No evidence of bacterial infection or drainage bilaterally.  Orthopedic  No limitations of motion  feet .  No crepitus or effusions noted.  IPJ hallux malleus right foot.  Pain planovalgus foot structure noted calcaneovalgus to many toe signs unable to recruit the arch with dorsiflexion of the hallux.  Skin  normotropic skin with no porokeratosis noted bilaterally.  No signs of infections or ulcers noted.    Onychomycosis  Pain in right toes  Pain in left toes  Hallux  Malleus  B/l.  Consent was obtained for treatment procedures.   Mechanical debridement of nails 1-5  bilaterally performed with a nail nipper.  Filed with dremel without incident.    Return office visit  10 weeks                   Told patient to return for periodic foot care and evaluation due to potential at risk complications.  Pes planovalgus -I explained to patient the etiology of pes planovalgus and relationship with Planter fasciitis and various  treatment options were discussed.  Given patient foot structure in the setting of Planter fasciitis I believe patient will benefit from custom-made orthotics to help control the hindfoot motion support the arch of the foot and take the stress away from plantar fascial.  Patient agrees with the plan like to proceed with orthotics -Orthotics were dispensed to the functioning well   Boneta Lucks D.P.M.

## 2022-05-01 DIAGNOSIS — M5416 Radiculopathy, lumbar region: Secondary | ICD-10-CM | POA: Diagnosis not present

## 2022-05-01 DIAGNOSIS — M48062 Spinal stenosis, lumbar region with neurogenic claudication: Secondary | ICD-10-CM | POA: Diagnosis not present

## 2022-05-01 DIAGNOSIS — M5126 Other intervertebral disc displacement, lumbar region: Secondary | ICD-10-CM | POA: Diagnosis not present

## 2022-05-18 DIAGNOSIS — M5412 Radiculopathy, cervical region: Secondary | ICD-10-CM | POA: Diagnosis not present

## 2022-05-18 DIAGNOSIS — M5126 Other intervertebral disc displacement, lumbar region: Secondary | ICD-10-CM | POA: Diagnosis not present

## 2022-05-18 DIAGNOSIS — M48062 Spinal stenosis, lumbar region with neurogenic claudication: Secondary | ICD-10-CM | POA: Diagnosis not present

## 2022-05-18 DIAGNOSIS — M5416 Radiculopathy, lumbar region: Secondary | ICD-10-CM | POA: Diagnosis not present

## 2022-05-18 DIAGNOSIS — M5136 Other intervertebral disc degeneration, lumbar region: Secondary | ICD-10-CM | POA: Diagnosis not present

## 2022-05-18 DIAGNOSIS — M5030 Other cervical disc degeneration, unspecified cervical region: Secondary | ICD-10-CM | POA: Diagnosis not present

## 2022-06-01 ENCOUNTER — Ambulatory Visit: Payer: PPO | Admitting: Podiatry

## 2022-06-01 ENCOUNTER — Encounter: Payer: Self-pay | Admitting: Podiatry

## 2022-06-01 VITALS — BP 186/92 | HR 67

## 2022-06-01 DIAGNOSIS — B351 Tinea unguium: Secondary | ICD-10-CM

## 2022-06-01 DIAGNOSIS — M79676 Pain in unspecified toe(s): Secondary | ICD-10-CM

## 2022-06-01 DIAGNOSIS — E119 Type 2 diabetes mellitus without complications: Secondary | ICD-10-CM | POA: Diagnosis not present

## 2022-06-01 NOTE — Progress Notes (Signed)
This patient returns to my office for at risk foot care.  This patient requires this care by a professional since this patient will be at risk due to having diabetes, coagulation disorder and PVD.  This patient is unable to cut nails himself since the patient cannot reach his nails.These nails are painful walking and wearing shoes. .This patient presents for at risk foot care today.  General Appearance  Alert, conversant and in no acute stress.  Vascular  Dorsalis pedis and posterior tibial  pulses are weakly  palpable  bilaterally.  Capillary return is within normal limits  bilaterally. Cold feet  Bilaterally.  Absent digital hair.  Neurologic  Senn-Weinstein monofilament wire test within normal limits  bilaterally. Muscle power within normal limits bilaterally.  Nails Thick disfigured discolored nails with subungual debris  from hallux to fifth toes bilaterally. No evidence of bacterial infection or drainage bilaterally.  Orthopedic  No limitations of motion  feet .  No crepitus or effusions noted.  IPJ hallux malleus right foot.  Skin  normotropic skin with no porokeratosis noted bilaterally.  No signs of infections or ulcers noted.    Onychomycosis  Pain in right toes  Pain in left toes  Hallux  Malleus  B/l.  Consent was obtained for treatment procedures.   Mechanical debridement of nails 1-5  bilaterally performed with a nail nipper.  Filed with dremel without incident.    Return office visit  10 weeks                   Told patient to return for periodic foot care and evaluation due to potential at risk complications.   Gardiner Barefoot DPM

## 2022-08-25 ENCOUNTER — Ambulatory Visit: Payer: PPO | Admitting: Podiatry

## 2022-08-31 ENCOUNTER — Ambulatory Visit: Payer: PPO | Admitting: Podiatry

## 2022-08-31 DIAGNOSIS — E119 Type 2 diabetes mellitus without complications: Secondary | ICD-10-CM | POA: Diagnosis not present

## 2022-08-31 DIAGNOSIS — B351 Tinea unguium: Secondary | ICD-10-CM

## 2022-08-31 DIAGNOSIS — M79676 Pain in unspecified toe(s): Secondary | ICD-10-CM

## 2022-08-31 NOTE — Progress Notes (Signed)
This patient returns to my office for at risk foot care.  This patient requires this care by a professional since this patient will be at risk due to having diabetes, coagulation disorder and PVD.  This patient is unable to cut nails himself since the patient cannot reach his nails.These nails are painful walking and wearing shoes. .This patient presents for at risk foot care today.  General Appearance  Alert, conversant and in no acute stress.  Vascular  Dorsalis pedis and posterior tibial  pulses are weakly  palpable  bilaterally.  Capillary return is within normal limits  bilaterally. Cold feet  Bilaterally.  Absent digital hair.  Neurologic  Senn-Weinstein monofilament wire test within normal limits  bilaterally. Muscle power within normal limits bilaterally.  Nails Thick disfigured discolored nails with subungual debris  from hallux to fifth toes bilaterally. No evidence of bacterial infection or drainage bilaterally.  Orthopedic  No limitations of motion  feet .  No crepitus or effusions noted.  IPJ hallux malleus right foot.  Skin  normotropic skin with no porokeratosis noted bilaterally.  No signs of infections or ulcers noted.    Onychomycosis  Pain in right toes  Pain in left toes  Hallux  Malleus  B/l.  Consent was obtained for treatment procedures.   Mechanical debridement of nails 1-5  bilaterally performed with a nail nipper.  Filed with dremel without incident.    Return office visit  10 weeks                   Told patient to return for periodic foot care and evaluation due to potential at risk complications.   Nicholes Rough D.P.M.

## 2022-10-02 DIAGNOSIS — M109 Gout, unspecified: Secondary | ICD-10-CM | POA: Diagnosis not present

## 2022-10-02 DIAGNOSIS — N1831 Chronic kidney disease, stage 3a: Secondary | ICD-10-CM | POA: Diagnosis not present

## 2022-10-02 DIAGNOSIS — D508 Other iron deficiency anemias: Secondary | ICD-10-CM | POA: Diagnosis not present

## 2022-10-02 DIAGNOSIS — E538 Deficiency of other specified B group vitamins: Secondary | ICD-10-CM | POA: Diagnosis not present

## 2022-10-02 DIAGNOSIS — E782 Mixed hyperlipidemia: Secondary | ICD-10-CM | POA: Diagnosis not present

## 2022-10-02 DIAGNOSIS — E039 Hypothyroidism, unspecified: Secondary | ICD-10-CM | POA: Diagnosis not present

## 2022-10-02 DIAGNOSIS — E119 Type 2 diabetes mellitus without complications: Secondary | ICD-10-CM | POA: Diagnosis not present

## 2022-10-09 DIAGNOSIS — Z0000 Encounter for general adult medical examination without abnormal findings: Secondary | ICD-10-CM | POA: Diagnosis not present

## 2022-10-09 DIAGNOSIS — E538 Deficiency of other specified B group vitamins: Secondary | ICD-10-CM | POA: Diagnosis not present

## 2022-10-09 DIAGNOSIS — I714 Abdominal aortic aneurysm, without rupture, unspecified: Secondary | ICD-10-CM | POA: Diagnosis not present

## 2022-10-09 DIAGNOSIS — I739 Peripheral vascular disease, unspecified: Secondary | ICD-10-CM | POA: Diagnosis not present

## 2022-10-09 DIAGNOSIS — E119 Type 2 diabetes mellitus without complications: Secondary | ICD-10-CM | POA: Diagnosis not present

## 2022-10-09 DIAGNOSIS — K219 Gastro-esophageal reflux disease without esophagitis: Secondary | ICD-10-CM | POA: Diagnosis not present

## 2022-10-09 DIAGNOSIS — E782 Mixed hyperlipidemia: Secondary | ICD-10-CM | POA: Diagnosis not present

## 2022-10-09 DIAGNOSIS — E039 Hypothyroidism, unspecified: Secondary | ICD-10-CM | POA: Diagnosis not present

## 2022-10-09 DIAGNOSIS — J439 Emphysema, unspecified: Secondary | ICD-10-CM | POA: Diagnosis not present

## 2022-10-09 DIAGNOSIS — I10 Essential (primary) hypertension: Secondary | ICD-10-CM | POA: Diagnosis not present

## 2022-10-09 DIAGNOSIS — I5022 Chronic systolic (congestive) heart failure: Secondary | ICD-10-CM | POA: Diagnosis not present

## 2022-10-09 DIAGNOSIS — D692 Other nonthrombocytopenic purpura: Secondary | ICD-10-CM | POA: Diagnosis not present

## 2022-10-19 DIAGNOSIS — Z8521 Personal history of malignant neoplasm of larynx: Secondary | ICD-10-CM | POA: Diagnosis not present

## 2022-10-19 DIAGNOSIS — R053 Chronic cough: Secondary | ICD-10-CM | POA: Diagnosis not present

## 2022-10-19 DIAGNOSIS — K219 Gastro-esophageal reflux disease without esophagitis: Secondary | ICD-10-CM | POA: Diagnosis not present

## 2022-10-26 DIAGNOSIS — H04223 Epiphora due to insufficient drainage, bilateral lacrimal glands: Secondary | ICD-10-CM | POA: Diagnosis not present

## 2022-10-26 DIAGNOSIS — H47323 Drusen of optic disc, bilateral: Secondary | ICD-10-CM | POA: Diagnosis not present

## 2022-10-26 DIAGNOSIS — H02132 Senile ectropion of right lower eyelid: Secondary | ICD-10-CM | POA: Diagnosis not present

## 2022-10-26 DIAGNOSIS — M3501 Sicca syndrome with keratoconjunctivitis: Secondary | ICD-10-CM | POA: Diagnosis not present

## 2022-11-02 DIAGNOSIS — E785 Hyperlipidemia, unspecified: Secondary | ICD-10-CM | POA: Diagnosis not present

## 2022-11-02 DIAGNOSIS — E039 Hypothyroidism, unspecified: Secondary | ICD-10-CM | POA: Diagnosis not present

## 2022-11-02 DIAGNOSIS — Z6834 Body mass index (BMI) 34.0-34.9, adult: Secondary | ICD-10-CM | POA: Diagnosis not present

## 2022-11-02 DIAGNOSIS — J449 Chronic obstructive pulmonary disease, unspecified: Secondary | ICD-10-CM | POA: Diagnosis not present

## 2022-11-02 DIAGNOSIS — Z79890 Hormone replacement therapy (postmenopausal): Secondary | ICD-10-CM | POA: Diagnosis not present

## 2022-11-02 DIAGNOSIS — I10 Essential (primary) hypertension: Secondary | ICD-10-CM | POA: Diagnosis not present

## 2022-11-02 DIAGNOSIS — D692 Other nonthrombocytopenic purpura: Secondary | ICD-10-CM | POA: Diagnosis not present

## 2022-11-13 ENCOUNTER — Other Ambulatory Visit: Payer: Self-pay

## 2022-11-13 ENCOUNTER — Emergency Department (HOSPITAL_COMMUNITY)
Admission: EM | Admit: 2022-11-13 | Discharge: 2022-11-13 | Disposition: A | Discharge status: Home / Self Care | Payer: PPO | Attending: Emergency Medicine | Admitting: Emergency Medicine

## 2022-11-13 ENCOUNTER — Emergency Department (HOSPITAL_COMMUNITY): Payer: PPO

## 2022-11-13 DIAGNOSIS — R9389 Abnormal findings on diagnostic imaging of other specified body structures: Secondary | ICD-10-CM | POA: Diagnosis not present

## 2022-11-13 DIAGNOSIS — J449 Chronic obstructive pulmonary disease, unspecified: Secondary | ICD-10-CM | POA: Diagnosis not present

## 2022-11-13 DIAGNOSIS — Z8673 Personal history of transient ischemic attack (TIA), and cerebral infarction without residual deficits: Secondary | ICD-10-CM | POA: Insufficient documentation

## 2022-11-13 DIAGNOSIS — I110 Hypertensive heart disease with heart failure: Secondary | ICD-10-CM | POA: Insufficient documentation

## 2022-11-13 DIAGNOSIS — Z8521 Personal history of malignant neoplasm of larynx: Secondary | ICD-10-CM | POA: Insufficient documentation

## 2022-11-13 DIAGNOSIS — E119 Type 2 diabetes mellitus without complications: Secondary | ICD-10-CM | POA: Insufficient documentation

## 2022-11-13 DIAGNOSIS — I5022 Chronic systolic (congestive) heart failure: Secondary | ICD-10-CM | POA: Diagnosis not present

## 2022-11-13 DIAGNOSIS — I471 Supraventricular tachycardia, unspecified: Secondary | ICD-10-CM

## 2022-11-13 DIAGNOSIS — I491 Atrial premature depolarization: Secondary | ICD-10-CM | POA: Diagnosis not present

## 2022-11-13 DIAGNOSIS — R42 Dizziness and giddiness: Secondary | ICD-10-CM | POA: Diagnosis not present

## 2022-11-13 DIAGNOSIS — R000 Tachycardia, unspecified: Secondary | ICD-10-CM | POA: Diagnosis not present

## 2022-11-13 DIAGNOSIS — Z7901 Long term (current) use of anticoagulants: Secondary | ICD-10-CM | POA: Insufficient documentation

## 2022-11-13 DIAGNOSIS — I499 Cardiac arrhythmia, unspecified: Secondary | ICD-10-CM | POA: Diagnosis not present

## 2022-11-13 DIAGNOSIS — Z7982 Long term (current) use of aspirin: Secondary | ICD-10-CM | POA: Diagnosis not present

## 2022-11-13 DIAGNOSIS — I2510 Atherosclerotic heart disease of native coronary artery without angina pectoris: Secondary | ICD-10-CM | POA: Diagnosis not present

## 2022-11-13 DIAGNOSIS — E039 Hypothyroidism, unspecified: Secondary | ICD-10-CM | POA: Diagnosis not present

## 2022-11-13 DIAGNOSIS — J9811 Atelectasis: Secondary | ICD-10-CM | POA: Diagnosis not present

## 2022-11-13 DIAGNOSIS — Z951 Presence of aortocoronary bypass graft: Secondary | ICD-10-CM | POA: Diagnosis not present

## 2022-11-13 DIAGNOSIS — I4891 Unspecified atrial fibrillation: Secondary | ICD-10-CM | POA: Diagnosis not present

## 2022-11-13 LAB — BASIC METABOLIC PANEL
Anion gap: 15 (ref 5–15)
BUN: 25 mg/dL — ABNORMAL HIGH (ref 8–23)
CO2: 26 mmol/L (ref 22–32)
Calcium: 8.8 mg/dL — ABNORMAL LOW (ref 8.9–10.3)
Chloride: 98 mmol/L (ref 98–111)
Creatinine, Ser: 1.16 mg/dL (ref 0.61–1.24)
GFR, Estimated: >60 mL/min (ref >60)
Glucose, Bld: 130 mg/dL — ABNORMAL HIGH (ref 70–99)
Potassium: 3.8 mmol/L (ref 3.5–5.1)
Sodium: 139 mmol/L (ref 135–145)

## 2022-11-13 LAB — CBC
HCT: 38.1 % — ABNORMAL LOW (ref 39.0–52.0)
Hemoglobin: 12.1 g/dL — ABNORMAL LOW (ref 13.0–17.0)
MCH: 29.7 pg (ref 26.0–34.0)
MCHC: 31.8 g/dL (ref 30.0–36.0)
MCV: 93.4 fL (ref 80.0–100.0)
Platelets: 192 10*3/uL (ref 150–400)
RBC: 4.08 MIL/uL — ABNORMAL LOW (ref 4.22–5.81)
RDW: 14 % (ref 11.5–15.5)
WBC: 7.6 10*3/uL (ref 4.0–10.5)
nRBC: 0 % (ref 0.0–0.2)

## 2022-11-13 LAB — TROPONIN I (HIGH SENSITIVITY)
Troponin I (High Sensitivity): 10 ng/L (ref <18)
Troponin I (High Sensitivity): 13 ng/L (ref <18)

## 2022-11-13 LAB — MAGNESIUM: Magnesium: 1.7 mg/dL (ref 1.7–2.4)

## 2022-11-13 NOTE — ED Triage Notes (Signed)
Per EMS, pt has history of a chronic cough, however today it was harder than normal, pt started to feel weak, hot, lightheaded and diaphoretic. He had taken 2 nitros (20 minutes apart) and 325mg  of aspirin and it felt better. EKG with EMS showed Afib to SVT, performed vagal maneuver and patient maintained in 90s when arriving to ED.  Pt denies of any pain.   Hx: Throat Cancer, Triple A with abd stent, and cardiac bypass   BP: 153/74 RR: 20 95% RA CBG: 147

## 2022-11-13 NOTE — ED Notes (Signed)
ED Provider at bedside. 

## 2022-11-13 NOTE — ED Notes (Signed)
Patient transported to X-ray 

## 2022-11-13 NOTE — ED Provider Notes (Signed)
[place] EMERGENCY DEPARTMENT AT [facility] Provider Note  CSN: [id number] Arrival date & time: 11/13/22 1743  Chief Complaint(s) SVT  HPI Jesse Allison is a 82 y.o. male with past medical history as below, significant for AAA s/p EVAR 2008, chronic cough, COPD, CABG, laryngeal cancer, HTN, TIA who presents to the ED with complaint of rapid heart rate. Pt reports around 1600 he felt like his heart was racing, he felt light headed, near syncopal, nauseated, no CP. This began after coughing fit/spasm. He took NTG x2 at home with minimal improvement and also ASA 324 x1. EMS assessed the pt and helped him to perform vagal maneuver which improved his symptoms, he is currently asymptomatic. No cp or dib, no palpitations or syncopal /near syncopal sensation. No hx SVT previously, no recent diet or medication changes, no excessive stimulant use, no illicit drug use  Past Medical History Past Medical History:  Diagnosis Date   AAA (abdominal aortic aneurysm) (HCC)    s/p EVAR in 2008   Anemia    iron deficiency   Anxiety    Aortic atherosclerosis (HCC)    Arthritis    Chronic cough    since teeanage years   Complication of anesthesia    woke up during AAA repair. Had an epidural and does NOT want again.   COPD (chronic obstructive pulmonary disease) (HCC)    Coronary artery disease    Diverticulosis    Dyspnea    GERD (gastroesophageal reflux disease)    Gout    Hx of CABG 09/1991   Hx of laryngeal cancer 2015   s/p surgery and XRT   Hypercholesterolemia    Hypertension    Hypothyroidism    Myocardial infarction (HCC) 1993   Obesity (BMI 30.0-34.9) 08/11/2019   PONV (postoperative nausea and vomiting)    Several times, vomitting for hours after surgery.   PVD (peripheral vascular disease) (HCC)    Sleep apnea    Spinal stenosis    TIA (transient ischemic attack)    Patient Active Problem List   Diagnosis Date Noted   SBO (small bowel obstruction) (HCC) 06/07/2020    Chronic systolic CHF (congestive heart failure) (HCC) 06/07/2020   HTN (hypertension) 06/07/2020   TIA (transient ischemic attack) 06/07/2020   Hypothyroidism 06/07/2020   Cellulitis and abscess of toe 01/05/2020   Injury of toenail, left, initial encounter 10/02/2019   Diet-controlled type 2 diabetes mellitus (HCC) 09/09/2019   Status post total hip replacement, right 08/21/2019   Coagulation disorder (HCC) 11/14/2018   Anxiety 08/15/2018   Benign essential hypertension 08/15/2018   CAD (coronary artery disease), autologous vein bypass graft 08/15/2018   COPD (chronic obstructive pulmonary disease) (HCC) 08/15/2018   Diverticulosis 08/15/2018   Edema 08/15/2018   GERD (gastroesophageal reflux disease) 08/15/2018   Gout, joint 08/15/2018   Mixed hyperlipidemia 08/15/2018   PVD (peripheral vascular disease) (HCC) 08/15/2018   Sleep apnea 08/15/2018   Type 2 diabetes mellitus without complication (HCC) 08/15/2018   Vitamin B12 deficiency 08/15/2018   Onychomycosis of multiple toenails with type 2 diabetes mellitus (HCC) 09/28/2017   Morbid obesity (HCC) 08/08/2016   Senile purpura (HCC) 02/08/2016   Abdominal aortic aneurysm (AAA) without rupture (HCC) 02/07/2016   Aortic aneurysm (HCC) 11/04/2015   H/O laryngeal cancer 08/05/2015   Anemia, iron deficiency 03/07/2015   Lumbar radiculitis 08/25/2013   Lumbar spondylosis 08/25/2013   Lumbar stenosis with neurogenic claudication 08/25/2013   Home Medication(s) Prior to Admission medications   Medication  Sig Start Date End Date Taking? Authorizing Provider  allopurinol (ZYLOPRIM) 300 MG tablet Take 1 tablet by mouth daily. 12/27/20  Yes [provider]  aspirin EC 81 MG tablet Take 1 tablet (81 mg total) by mouth in the morning and at bedtime. Patient taking differently: Take 81 mg by mouth daily. 08/22/19  Yes Evon Slack, PA-C  azelastine (ASTELIN) 0.1 % nasal spray Place 1-2 sprays into both nostrils 2 (two) times  daily as needed for allergies.  04/13/18  Yes [provider]  bismuth subsalicylate (PEPTO BISMOL) 262 MG/15ML suspension Take 30 mLs by mouth every 6 (six) hours as needed for indigestion.   Yes [provider]  calcium carbonate (OSCAL) 1500 (600 Ca) MG TABS tablet Take 600 mg of elemental calcium by mouth at bedtime.    Yes [provider]  Carboxymethylcellulose Sodium (LUBRICANT EYE DROPS OP) Place 1 drop into both eyes daily as needed (dry eye).   Yes [provider]  carvedilol (COREG) 3.125 MG tablet Take 3.125 mg by mouth 2 (two) times daily. 10/15/19  Yes [provider]  clopidogrel (PLAVIX) 75 MG tablet Take 75 mg by mouth daily. 01/23/19  Yes [provider]  cyanocobalamin 1000 MCG tablet Take 1,000 mcg by mouth daily after lunch.    Yes [provider]  doxazosin (CARDURA) 2 MG tablet Take 2 mg by mouth at bedtime. 06/19/22  Yes [provider]  furosemide (LASIX) 40 MG tablet Take 40 mg by mouth daily. 07/17/19  Yes [provider]  Iron, Ferrous Sulfate, 142 (45 Fe) MG TBCR Take 45 mg by mouth daily with lunch.    Yes [provider]  levothyroxine (SYNTHROID) 50 MCG tablet Take 75 mcg by mouth daily. 04/08/21  Yes [provider]  losartan (COZAAR) 100 MG tablet Take 100 mg by mouth at bedtime. 04/08/21  Yes [provider]  lovastatin (MEVACOR) 20 MG tablet Take 20 mg by mouth at bedtime. 04/08/21  Yes [provider]  Misc Natural Products (OSTEO BI-FLEX JOINT SHIELD PO) Take 2 tablets by mouth daily.   Yes [provider]  nitroGLYCERIN (NITROSTAT) 0.4 MG SL tablet Place 0.4 mg under the tongue every 5 (five) minutes as needed for chest pain. 06/24/19  Yes [provider]  pantoprazole (PROTONIX) 40 MG tablet Take 40 mg by mouth daily before supper. 07/17/19  Yes [provider]  potassium chloride (KLOR-CON) 10 MEQ tablet Take 10 mEq by mouth  daily. 01/23/19  Yes [provider]                                                                                                                                    Past Surgical History Past Surgical History:  Procedure Laterality Date   ABDOMINAL AORTIC ANEURYSM REPAIR  2010   no longer followed on a regular basis   BACK SURGERY  2000  L4 . piece of vertebrae broke off and lodged into spine. had to remove the loose piece.   CARDIAC CATHETERIZATION     CATARACT EXTRACTION W/PHACO Left 12/12/2016   Procedure: CATARACT EXTRACTION PHACO AND INTRAOCULAR LENS PLACEMENT (IOC);  Surgeon: Galen Manila, MD;  Location: [facility] ORS;  Service: Ophthalmology;  Laterality: Left;  [place] 00:38.1 AP% 14.1 CDE 5.38 FLUID PACK LOT # [id number] H   CATARACT EXTRACTION W/PHACO Right 12/26/2016   Procedure: CATARACT EXTRACTION PHACO AND INTRAOCULAR LENS PLACEMENT (IOC);  Surgeon: Galen Manila, MD;  Location: [facility] ORS;  Service: Ophthalmology;  Laterality: Right;  [place] 00:53.7 AP% 12.9 CDE 6.92 Fluid Pack Lot # [id number] H   COLONOSCOPY WITH PROPOFOL N/A 06/22/2020   Procedure: COLONOSCOPY WITH PROPOFOL;  Surgeon: Regis Bill, MD;  Location: [facility] ENDOSCOPY;  Service: Endoscopy;  Laterality: N/A;   CORONARY ARTERY BYPASS GRAFT  1993   EYE SURGERY Bilateral    LARYNGOSCOPY  2015   tumor removal followed by radiation treatments.   ROBOTIC ASSISTED LAPAROSCOPIC LYSIS OF ADHESION  07/15/2020   Procedure: XI ROBOTIC ASSISTED LAPAROSCOPIC LYSIS OF ADHESION;  Surgeon: Sung Amabile, DO;  Location: [facility] ORS;  Service: General;;   TESTICLAR CYST EXCISION     TOTAL HIP ARTHROPLASTY Right 08/21/2019   Procedure: TOTAL HIP ARTHROPLASTY ANTERIOR APPROACH;  Surgeon: Kennedy Bucker, MD;  Location: [facility] ORS;  Service: Orthopedics;  Laterality: Right;   Family History Family History  Problem Relation Age of Onset   Leukemia Mother    Heart attack Father    Stroke Sister     Social History Social History    Tobacco Use   Smoking status: Former    Current packs/day: 0.00    Types: Cigarettes    Quit date: 2010    Years since quitting: 14.6   Smokeless tobacco: Never  Vaping Use   Vaping status: Never Used  Substance Use Topics   Alcohol use: Yes    Comment: couple of glasses of wine per day.   Drug use: No   Allergies Niacin, Oxycodone, and Prednisone  Review of Systems Review of Systems  Constitutional:  Positive for diaphoresis. Negative for chills and fever.  Respiratory:  Negative for chest tightness and shortness of breath.   Cardiovascular:  Positive for palpitations. Negative for chest pain and leg swelling.  Gastrointestinal:  Positive for nausea. Negative for abdominal pain and vomiting.  Neurological:  Positive for light-headedness.  All other systems reviewed and are negative.   Physical Exam Vital Signs  I have reviewed the triage vital signs BP (!) 155/81   Pulse 76   Temp 98 F (36.7 C)   Resp 15   Ht 5\' 11"  (1.803 m)   Wt 111.1 kg   SpO2 96%   BMI 34.17 kg/m  Physical Exam Vitals and nursing note reviewed.  Constitutional:      General: He is not in acute distress.    Appearance: Normal appearance. He is well-developed. He is obese.  HENT:     Head: Normocephalic and atraumatic.     Right Ear: External ear normal.     Left Ear: External ear normal.     Mouth/Throat:     Mouth: Mucous membranes are moist.  Eyes:     General: No scleral icterus. Cardiovascular:     Rate and Rhythm: Normal rate and regular rhythm.     Pulses: Normal pulses.     Heart sounds: Normal heart sounds.  Pulmonary:     Effort: Pulmonary effort is  normal. No respiratory distress.     Breath sounds: Normal breath sounds.  Abdominal:     General: Abdomen is flat.     Palpations: Abdomen is soft.     Tenderness: There is no abdominal tenderness. There is no guarding.  Musculoskeletal:     Cervical back: No rigidity.     Right lower leg: No edema.     Left lower leg:  No edema.  Skin:    General: Skin is warm and dry.     Capillary Refill: Capillary refill takes less than 2 seconds.  Neurological:     Mental Status: He is alert.  Psychiatric:        Mood and Affect: Mood normal.        Behavior: Behavior normal.     ED Results and Treatments Labs (all labs ordered are listed, but only abnormal results are displayed) Labs Reviewed  BASIC METABOLIC PANEL - Abnormal; Notable for the following components:      Result Value   Glucose, Bld 130 (*)    BUN 25 (*)    Calcium 8.8 (*)    All other components within normal limits  CBC - Abnormal; Notable for the following components:   RBC 4.08 (*)    Hemoglobin 12.1 (*)    HCT 38.1 (*)    All other components within normal limits  MAGNESIUM  TROPONIN I (HIGH SENSITIVITY)  TROPONIN I (HIGH SENSITIVITY)                                                                                                                          Radiology DG Chest 2 View  Result Date: 11/13/2022 CLINICAL DATA:  svt EXAM: CHEST - 2 VIEW COMPARISON:  Chest x-ray 10/28/2010 FINDINGS: The heart and mediastinal contours are unchanged. Elevated left hemidiaphragm with no definite hiatal hernia. Left base atelectasis. No focal consolidation. No pulmonary edema. No pleural effusion. No pneumothorax. No acute osseous abnormality.  Sternotomy wires are intact. IMPRESSION: No active cardiopulmonary disease. Electronically Signed   By: Tish Frederickson M.D.   On: 11/13/2022 18:56    Pertinent labs & imaging results that were available during my care of the patient were reviewed by me and considered in my medical decision making (see MDM for details).  Medications Ordered in ED Medications - No data to display  Procedures Procedures  (including critical care time)  Medical Decision Making / ED  Course    Medical Decision Making:    KEARNEY WOODRIDGE is a 82 y.o. male  with past medical history as below, significant for AAA s/p EVAR 2008, chronic cough, COPD, CABG, laryngeal cancer, HTN, TIA who presents to the ED with complaint of rapid heart rate. . The complaint involves an extensive differential diagnosis and also carries with it a high risk of complications and morbidity.  Serious etiology was considered. Ddx includes but is not limited to: SVT, afib rvr, arrhythmia, acs, metabolic derangement, etc  Complete initial physical exam performed, notably the patient  was NAD, asymptomatic.    Reviewed and confirmed nursing documentation for past medical history, family history, social history.  Vital signs reviewed.    Clinical Course as of 11/13/22 2239  Mon Nov 13, 2022  2234 Remains asymptomatic, will have pt f/u with his cardiologist  [SG]    Clinical Course User Index [SG] Sloan Leiter, DO    Narrative: 82 yo male here with near syncope, palpitations, light headed, feeling unwell Found to be in SVT with EMS and converted w/ vagal maneuvers No hx this prior Rhythm strip from EMS reviewed by myself shows SVT Feeling better in the ED Labs stable NSR on telemetry Symptoms resolved prior to arrival, have not returned while in the ER Will have him f/u with his cardiologist  The patient improved significantly and was discharged in stable condition. Detailed discussions were had with the patient regarding current findings, and need for close f/u with PCP or on call doctor. The patient has been instructed to return immediately if the symptoms worsen in any way for re-evaluation. Patient verbalized understanding and is in agreement with current care plan. All questions answered prior to discharge.          Additional history obtained: -Additional history obtained from spouse -External records from outside source obtained and reviewed including: Chart review including  previous notes, labs, imaging, consultation notes including  Home meds (coreg) Primary care documentation Prior labs/imaging  Follows w/ cardiology at [facility] (Dr Salome Arnt)   Lab Tests: -I ordered, reviewed, and interpreted labs.   The pertinent results include:   Labs Reviewed  BASIC METABOLIC PANEL - Abnormal; Notable for the following components:      Result Value   Glucose, Bld 130 (*)    BUN 25 (*)    Calcium 8.8 (*)    All other components within normal limits  CBC - Abnormal; Notable for the following components:   RBC 4.08 (*)    Hemoglobin 12.1 (*)    HCT 38.1 (*)    All other components within normal limits  MAGNESIUM  TROPONIN I (HIGH SENSITIVITY)  TROPONIN I (HIGH SENSITIVITY)    Notable for labs stable  EKG   EKG Interpretation Date/Time:  Monday November 13 2022 17:49:57 EDT Ventricular Rate:  98 PR Interval:  252 QRS Duration:  109 QT Interval:  358 QTC Calculation: 458 R Axis:   15  Text Interpretation: Sinus arrhythmia Multiform ventricular premature complexes Prolonged PR interval Anteroseptal infarct, age indeterminate no stemi Confirmed by Tanda Rockers ([id number]) on 11/13/2022 6:02:24 PM         Imaging Studies ordered: I ordered imaging studies including CXR I independently visualized the following imaging with scope of interpretation limited to determining acute life threatening conditions related to emergency care; findings noted above, significant for no ptx or pna I independently  visualized and interpreted imaging. I agree with the radiologist interpretation   Medicines ordered and prescription drug management: No orders of the defined types were placed in this encounter.   -I have reviewed the patients home medicines and have made adjustments as needed   Consultations Obtained: na   Cardiac Monitoring: The patient was maintained on a cardiac monitor.  I personally viewed and interpreted the cardiac monitored which showed an underlying  rhythm of: NSR  Social Determinants of Health:  Diagnosis or treatment significantly limited by social determinants of health: former smoker   Reevaluation: After the interventions noted above, I reevaluated the patient and found that they have stayed the same  Co morbidities that complicate the patient evaluation  Past Medical History:  Diagnosis Date   AAA (abdominal aortic aneurysm) (HCC)    s/p EVAR in 2008   Anemia    iron deficiency   Anxiety    Aortic atherosclerosis (HCC)    Arthritis    Chronic cough    since teeanage years   Complication of anesthesia    woke up during AAA repair. Had an epidural and does NOT want again.   COPD (chronic obstructive pulmonary disease) (HCC)    Coronary artery disease    Diverticulosis    Dyspnea    GERD (gastroesophageal reflux disease)    Gout    Hx of CABG 09/1991   Hx of laryngeal cancer 2015   s/p surgery and XRT   Hypercholesterolemia    Hypertension    Hypothyroidism    Myocardial infarction (HCC) 1993   Obesity (BMI 30.0-34.9) 08/11/2019   PONV (postoperative nausea and vomiting)    Several times, vomitting for hours after surgery.   PVD (peripheral vascular disease) (HCC)    Sleep apnea    Spinal stenosis    TIA (transient ischemic attack)       Dispostion: Disposition decision including need for hospitalization was considered, and patient discharged from emergency department.    Final Clinical Impression(s) / ED Diagnoses Final diagnoses:  None        Sloan Leiter, DO 11/13/22 2239

## 2022-11-13 NOTE — Discharge Instructions (Addendum)
Please call your cardiologist / PCP to arrange for follow up appointment   Please avoid excessive use of stimulants such as caffeine, follow a heart healthy diet, return for any worsening or worrisome symptoms.   It was a pleasure caring for you today in the emergency department.  Please return to the emergency department for any worsening or worrisome symptoms.

## 2022-11-19 ENCOUNTER — Inpatient Hospital Stay: Admission: EM | Admit: 2022-11-19 | Discharge: 2022-12-10 | DRG: 242 | Disposition: E | Payer: PPO

## 2022-11-19 ENCOUNTER — Other Ambulatory Visit: Payer: Self-pay

## 2022-11-19 ENCOUNTER — Emergency Department: Payer: PPO

## 2022-11-19 DIAGNOSIS — R079 Chest pain, unspecified: Secondary | ICD-10-CM | POA: Diagnosis not present

## 2022-11-19 DIAGNOSIS — Z79890 Hormone replacement therapy (postmenopausal): Secondary | ICD-10-CM

## 2022-11-19 DIAGNOSIS — I959 Hypotension, unspecified: Secondary | ICD-10-CM | POA: Diagnosis not present

## 2022-11-19 DIAGNOSIS — Z951 Presence of aortocoronary bypass graft: Secondary | ICD-10-CM

## 2022-11-19 DIAGNOSIS — I5021 Acute systolic (congestive) heart failure: Secondary | ICD-10-CM

## 2022-11-19 DIAGNOSIS — I495 Sick sinus syndrome: Secondary | ICD-10-CM | POA: Diagnosis not present

## 2022-11-19 DIAGNOSIS — I469 Cardiac arrest, cause unspecified: Secondary | ICD-10-CM | POA: Insufficient documentation

## 2022-11-19 DIAGNOSIS — R110 Nausea: Secondary | ICD-10-CM | POA: Diagnosis present

## 2022-11-19 DIAGNOSIS — Z8679 Personal history of other diseases of the circulatory system: Secondary | ICD-10-CM

## 2022-11-19 DIAGNOSIS — I10 Essential (primary) hypertension: Secondary | ICD-10-CM | POA: Diagnosis present

## 2022-11-19 DIAGNOSIS — M542 Cervicalgia: Secondary | ICD-10-CM | POA: Diagnosis present

## 2022-11-19 DIAGNOSIS — I498 Other specified cardiac arrhythmias: Secondary | ICD-10-CM | POA: Diagnosis not present

## 2022-11-19 DIAGNOSIS — I480 Paroxysmal atrial fibrillation: Secondary | ICD-10-CM | POA: Diagnosis present

## 2022-11-19 DIAGNOSIS — Z7982 Long term (current) use of aspirin: Secondary | ICD-10-CM

## 2022-11-19 DIAGNOSIS — Z923 Personal history of irradiation: Secondary | ICD-10-CM

## 2022-11-19 DIAGNOSIS — I499 Cardiac arrhythmia, unspecified: Secondary | ICD-10-CM | POA: Diagnosis not present

## 2022-11-19 DIAGNOSIS — E669 Obesity, unspecified: Secondary | ICD-10-CM | POA: Diagnosis present

## 2022-11-19 DIAGNOSIS — E039 Hypothyroidism, unspecified: Secondary | ICD-10-CM | POA: Diagnosis present

## 2022-11-19 DIAGNOSIS — I2510 Atherosclerotic heart disease of native coronary artery without angina pectoris: Secondary | ICD-10-CM | POA: Diagnosis present

## 2022-11-19 DIAGNOSIS — I252 Old myocardial infarction: Secondary | ICD-10-CM

## 2022-11-19 DIAGNOSIS — Z885 Allergy status to narcotic agent status: Secondary | ICD-10-CM

## 2022-11-19 DIAGNOSIS — Z8673 Personal history of transient ischemic attack (TIA), and cerebral infarction without residual deficits: Secondary | ICD-10-CM

## 2022-11-19 DIAGNOSIS — Z79899 Other long term (current) drug therapy: Secondary | ICD-10-CM

## 2022-11-19 DIAGNOSIS — Z8521 Personal history of malignant neoplasm of larynx: Secondary | ICD-10-CM

## 2022-11-19 DIAGNOSIS — F419 Anxiety disorder, unspecified: Secondary | ICD-10-CM | POA: Diagnosis present

## 2022-11-19 DIAGNOSIS — M545 Low back pain, unspecified: Secondary | ICD-10-CM | POA: Diagnosis present

## 2022-11-19 DIAGNOSIS — Z87891 Personal history of nicotine dependence: Secondary | ICD-10-CM

## 2022-11-19 DIAGNOSIS — E785 Hyperlipidemia, unspecified: Secondary | ICD-10-CM | POA: Diagnosis not present

## 2022-11-19 DIAGNOSIS — Z96641 Presence of right artificial hip joint: Secondary | ICD-10-CM | POA: Diagnosis present

## 2022-11-19 DIAGNOSIS — I420 Dilated cardiomyopathy: Secondary | ICD-10-CM | POA: Diagnosis present

## 2022-11-19 DIAGNOSIS — Z7901 Long term (current) use of anticoagulants: Secondary | ICD-10-CM

## 2022-11-19 DIAGNOSIS — R4020 Unspecified coma: Secondary | ICD-10-CM | POA: Diagnosis not present

## 2022-11-19 DIAGNOSIS — I2489 Other forms of acute ischemic heart disease: Secondary | ICD-10-CM | POA: Diagnosis present

## 2022-11-19 DIAGNOSIS — J449 Chronic obstructive pulmonary disease, unspecified: Secondary | ICD-10-CM | POA: Diagnosis present

## 2022-11-19 DIAGNOSIS — I739 Peripheral vascular disease, unspecified: Secondary | ICD-10-CM | POA: Diagnosis present

## 2022-11-19 DIAGNOSIS — Z7902 Long term (current) use of antithrombotics/antiplatelets: Secondary | ICD-10-CM

## 2022-11-19 DIAGNOSIS — K219 Gastro-esophageal reflux disease without esophagitis: Secondary | ICD-10-CM | POA: Diagnosis present

## 2022-11-19 DIAGNOSIS — I110 Hypertensive heart disease with heart failure: Secondary | ICD-10-CM | POA: Diagnosis present

## 2022-11-19 DIAGNOSIS — R0789 Other chest pain: Secondary | ICD-10-CM | POA: Diagnosis not present

## 2022-11-19 DIAGNOSIS — E7800 Pure hypercholesterolemia, unspecified: Secondary | ICD-10-CM | POA: Diagnosis present

## 2022-11-19 DIAGNOSIS — I4510 Unspecified right bundle-branch block: Secondary | ICD-10-CM | POA: Diagnosis present

## 2022-11-19 DIAGNOSIS — M79651 Pain in right thigh: Secondary | ICD-10-CM | POA: Diagnosis not present

## 2022-11-19 DIAGNOSIS — G4730 Sleep apnea, unspecified: Secondary | ICD-10-CM | POA: Diagnosis present

## 2022-11-19 DIAGNOSIS — R5381 Other malaise: Secondary | ICD-10-CM | POA: Diagnosis present

## 2022-11-19 DIAGNOSIS — I471 Supraventricular tachycardia, unspecified: Secondary | ICD-10-CM | POA: Diagnosis present

## 2022-11-19 DIAGNOSIS — Z823 Family history of stroke: Secondary | ICD-10-CM

## 2022-11-19 DIAGNOSIS — R0989 Other specified symptoms and signs involving the circulatory and respiratory systems: Secondary | ICD-10-CM | POA: Diagnosis not present

## 2022-11-19 DIAGNOSIS — M25551 Pain in right hip: Secondary | ICD-10-CM | POA: Diagnosis not present

## 2022-11-19 DIAGNOSIS — M109 Gout, unspecified: Secondary | ICD-10-CM | POA: Diagnosis present

## 2022-11-19 DIAGNOSIS — I700 Atherosclerosis of aorta: Secondary | ICD-10-CM | POA: Diagnosis present

## 2022-11-19 DIAGNOSIS — G8929 Other chronic pain: Secondary | ICD-10-CM | POA: Diagnosis present

## 2022-11-19 DIAGNOSIS — Z888 Allergy status to other drugs, medicaments and biological substances status: Secondary | ICD-10-CM

## 2022-11-19 DIAGNOSIS — N281 Cyst of kidney, acquired: Secondary | ICD-10-CM | POA: Diagnosis not present

## 2022-11-19 DIAGNOSIS — Z961 Presence of intraocular lens: Secondary | ICD-10-CM | POA: Diagnosis present

## 2022-11-19 DIAGNOSIS — Z8249 Family history of ischemic heart disease and other diseases of the circulatory system: Secondary | ICD-10-CM

## 2022-11-19 DIAGNOSIS — Z66 Do not resuscitate: Secondary | ICD-10-CM | POA: Diagnosis present

## 2022-11-19 DIAGNOSIS — Z6833 Body mass index (BMI) 33.0-33.9, adult: Secondary | ICD-10-CM

## 2022-11-19 LAB — D-DIMER, QUANTITATIVE: D-Dimer, Quant: 1.55 ug/mL-FEU — ABNORMAL HIGH (ref 0.00–0.50)

## 2022-11-19 LAB — COMPREHENSIVE METABOLIC PANEL
ALT: 13 U/L (ref 0–44)
AST: 14 U/L — ABNORMAL LOW (ref 15–41)
Albumin: 3.9 g/dL (ref 3.5–5.0)
Alkaline Phosphatase: 70 U/L (ref 38–126)
Anion gap: 10 (ref 5–15)
BUN: 28 mg/dL — ABNORMAL HIGH (ref 8–23)
CO2: 28 mmol/L (ref 22–32)
Calcium: 9 mg/dL (ref 8.9–10.3)
Chloride: 102 mmol/L (ref 98–111)
Creatinine, Ser: 1.11 mg/dL (ref 0.61–1.24)
GFR, Estimated: >60 mL/min (ref >60)
Glucose, Bld: 134 mg/dL — ABNORMAL HIGH (ref 70–99)
Potassium: 4 mmol/L (ref 3.5–5.1)
Sodium: 140 mmol/L (ref 135–145)
Total Bilirubin: 0.8 mg/dL (ref 0.3–1.2)
Total Protein: 7.1 g/dL (ref 6.5–8.1)

## 2022-11-19 LAB — CBC WITH DIFFERENTIAL/PLATELET
Abs Immature Granulocytes: 0.03 10*3/uL (ref 0.00–0.07)
Basophils Absolute: 0.1 10*3/uL (ref 0.0–0.1)
Basophils Relative: 1 %
Eosinophils Absolute: 0.3 10*3/uL (ref 0.0–0.5)
Eosinophils Relative: 3 %
HCT: 42 % (ref 39.0–52.0)
Hemoglobin: 13.7 g/dL (ref 13.0–17.0)
Immature Granulocytes: 0 %
Lymphocytes Relative: 20 %
Lymphs Abs: 1.9 10*3/uL (ref 0.7–4.0)
MCH: 30.4 pg (ref 26.0–34.0)
MCHC: 32.6 g/dL (ref 30.0–36.0)
MCV: 93.1 fL (ref 80.0–100.0)
Monocytes Absolute: 0.6 10*3/uL (ref 0.1–1.0)
Monocytes Relative: 7 %
Neutro Abs: 6.3 10*3/uL (ref 1.7–7.7)
Neutrophils Relative %: 69 %
Platelets: 201 10*3/uL (ref 150–400)
RBC: 4.51 MIL/uL (ref 4.22–5.81)
RDW: 13.8 % (ref 11.5–15.5)
WBC: 9.2 10*3/uL (ref 4.0–10.5)
nRBC: 0 % (ref 0.0–0.2)

## 2022-11-19 LAB — TSH: TSH: 2.819 u[IU]/mL (ref 0.350–4.500)

## 2022-11-19 LAB — TROPONIN I (HIGH SENSITIVITY)
Troponin I (High Sensitivity): 11 ng/L (ref <18)
Troponin I (High Sensitivity): 70 ng/L — ABNORMAL HIGH (ref <18)

## 2022-11-19 LAB — BRAIN NATRIURETIC PEPTIDE: B Natriuretic Peptide: 120.5 pg/mL — ABNORMAL HIGH (ref 0.0–100.0)

## 2022-11-19 LAB — MAGNESIUM: Magnesium: 2 mg/dL (ref 1.7–2.4)

## 2022-11-19 MED ORDER — ONDANSETRON HCL 4 MG PO TABS: 4.0000 mg | ORAL_TABLET | Freq: Four times a day (QID) | ORAL | Status: DC | PRN

## 2022-11-19 MED ORDER — ONDANSETRON HCL 4 MG/2ML IJ SOLN
4.0000 mg | Freq: Four times a day (QID) | INTRAMUSCULAR | Status: DC | PRN
  Filled 2022-11-19 (×3): qty 2

## 2022-11-19 MED ORDER — DILTIAZEM HCL 25 MG/5ML IV SOLN
INTRAVENOUS | Status: AC
  Administered 2022-11-19: 15 mg via INTRAVENOUS
  Filled 2022-11-19: qty 5

## 2022-11-19 MED ORDER — SODIUM CHLORIDE 0.9 % IV SOLN: INTRAVENOUS | Status: DC

## 2022-11-19 MED ORDER — ACETAMINOPHEN 325 MG PO TABS
650.0000 mg | ORAL_TABLET | Freq: Four times a day (QID) | ORAL | Status: DC | PRN
  Administered 2022-11-21 – 2022-11-23 (×3): 650 mg via ORAL
  Filled 2022-11-19 (×4): qty 2

## 2022-11-19 MED ORDER — DILTIAZEM HCL 25 MG/5ML IV SOLN: 15.0000 mg | Freq: Once | INTRAVENOUS | Status: AC

## 2022-11-19 MED ORDER — ENOXAPARIN SODIUM 60 MG/0.6ML IJ SOSY: 55.0000 mg | PREFILLED_SYRINGE | INTRAMUSCULAR | Status: DC

## 2022-11-19 MED ORDER — TRAZODONE HCL 50 MG PO TABS
25.0000 mg | ORAL_TABLET | Freq: Every evening | ORAL | Status: DC | PRN
  Administered 2022-11-21 – 2022-11-22 (×2): 25 mg via ORAL
  Filled 2022-11-19 (×3): qty 1

## 2022-11-19 MED ORDER — MAGNESIUM HYDROXIDE 400 MG/5ML PO SUSP: 30.0000 mL | Freq: Every day | ORAL | Status: DC | PRN

## 2022-11-19 MED ORDER — ACETAMINOPHEN 650 MG RE SUPP: 650.0000 mg | Freq: Four times a day (QID) | RECTAL | Status: DC | PRN

## 2022-11-19 MED ORDER — IOHEXOL 350 MG/ML SOLN
75.0000 mL | Freq: Once | INTRAVENOUS | Status: AC | PRN
  Administered 2022-11-19: 75 mL via INTRAVENOUS

## 2022-11-19 NOTE — ED Triage Notes (Addendum)
Pt c/o chest pain and pain between his shoulder blades since 5pm tonight, pt states he was at [facility] on Monday with SVT. Hx bypass in 1993. Pt states he also feels weak and lightheaded. Pt took 2 SL nitroglycerin PTA.

## 2022-11-19 NOTE — ED Provider Notes (Signed)
[facility] Provider Note    Event Date/Time   First MD Initiated Contact with Patient 11/19/22 1948     (approximate)   History   Chest Pain (Patent In by PV with c/o of chest pain that started aound 5pm this afternoon. Patient took rounds of nitroglycerin. Patient currently in SVT with HR in the 150's  )   HPI  Jesse Allison is a 82 y.o. male  with history of AAA, COPD, CABG, HTN presenting to the emergency department with chest pain.  Around 5 PM tonight, patient had onset of chest pain and pain between his shoulder blades.  He does report associated weakness and lightheadedness.  He does report that the palpitations and weakness feel similar to earlier this week when he was seen at [facility], but denies chest pain at that time.  Did review patient's ER visit at [facility] on 8/5.  At that time, he presented with symptoms of heart racing.  He received a vagal maneuver with EMS that broke his symptoms, no previously diagnosed history of SVT.  He had reassuring labs and was discharged with plans for cardiology follow-up.        Physical Exam   Triage Vital Signs: ED Triage Vitals  Encounter Vitals Group     BP 11/19/22 1944 (!) 152/102     Systolic BP Percentile --      Diastolic BP Percentile --      Pulse Rate 11/19/22 1942 (!) 151     Resp 11/19/22 1942 20     Temp 11/19/22 1942 98.6 F (37 C)     Temp Source 11/19/22 1942 Oral     SpO2 11/19/22 1942 97 %     Weight 11/19/22 1932 245 lb (111.1 kg)     Height 11/19/22 1932 5\' 11"  (1.803 m)     Head Circumference --      Peak Flow --      Pain Score 11/19/22 1932 5     Pain Loc --      Pain Education --      Exclude from Growth Chart --     Most recent vital signs: Vitals:   11/20/22 0000 11/20/22 0002  BP: (!) 161/65   Pulse: (!) 58 (!) 59  Resp: 20 17  Temp:    SpO2: 97% 97%     General: Awake, interactive  CV:  Tachycardic with variable heart rates from 120s to 160s, normal  peripheral perfusion  Resp:  Lungs clear, unlabored respirations.  Abd:  Soft, nondistended.  Neuro:  Symmetric facial movement, moving extremities spontaneously  ED Results / Procedures / Treatments   Labs (all labs ordered are listed, but only abnormal results are displayed) Labs Reviewed  COMPREHENSIVE METABOLIC PANEL - Abnormal; Notable for the following components:      Result Value   Glucose, Bld 134 (*)    BUN 28 (*)    AST 14 (*)    All other components within normal limits  D-DIMER, QUANTITATIVE - Abnormal; Notable for the following components:   D-Dimer, Quant 1.55 (*)    All other components within normal limits  BRAIN NATRIURETIC PEPTIDE - Abnormal; Notable for the following components:   B Natriuretic Peptide 120.5 (*)    All other components within normal limits  TROPONIN I (HIGH SENSITIVITY) - Abnormal; Notable for the following components:   Troponin I (High Sensitivity) 70 (*)    All other components within normal limits  CBC  WITH DIFFERENTIAL/PLATELET  TSH  MAGNESIUM  CBC  CREATININE, SERUM  BASIC METABOLIC PANEL  CBC  TROPONIN I (HIGH SENSITIVITY)     EKG EKG independently reviewed interpreted by myself (ER attending) demonstrates:  EKG from 1934 demonstrates a narrow complex tachycardia with intermittent beats of what appears to be sinus rhythm both followed by recurrent narrow complex tachycardia at a rate of 140, QRS 116, QTc 427 Repeat EKG from 1915  demonstrates what appears to be atrial fibrillation at a rate of 75, PR 272, QRS 111, QTc 406, subtle ST changes noted in multiple leads Another repeat EKG performed at 2327 now demonstrates sinus rhythm at a rate of 63, PR 272, QRS 109, QTc 422, persistent subtle ST changes in multiple leads  RADIOLOGY Imaging independently reviewed and interpreted by myself demonstrates:  CT of the chest without evidence of PE  PROCEDURES:  Critical Care performed: Yes, see critical care procedure  note(s)  CRITICAL CARE Performed by: Trinna Post   Total critical care time: 35 minutes  Critical care time was exclusive of separately billable procedures and treating other patients.  Critical care was necessary to treat or prevent imminent or life-threatening deterioration.  Critical care was time spent personally by me on the following activities: development of treatment plan with patient and/or surrogate as well as nursing, discussions with consultants, evaluation of patient's response to treatment, examination of patient, obtaining history from patient or surrogate, ordering and performing treatments and interventions, ordering and review of laboratory studies, ordering and review of radiographic studies, pulse oximetry and re-evaluation of patient's condition.   Procedures   MEDICATIONS ORDERED IN ED: Medications  enoxaparin (LOVENOX) injection 55 mg (has no administration in time range)  0.9 %  sodium chloride infusion (has no administration in time range)  acetaminophen (TYLENOL) tablet 650 mg (has no administration in time range)    Or  acetaminophen (TYLENOL) suppository 650 mg (has no administration in time range)  traZODone (DESYREL) tablet 25 mg (has no administration in time range)  ondansetron (ZOFRAN) tablet 4 mg (has no administration in time range)    Or  ondansetron (ZOFRAN) injection 4 mg (has no administration in time range)  magnesium hydroxide (MILK OF MAGNESIA) suspension 30 mL (has no administration in time range)  diltiazem (CARDIZEM) injection 15 mg (15 mg Intravenous Given 11/19/22 1945)  iohexol (OMNIPAQUE) 350 MG/ML injection 75 mL (75 mLs Intravenous Contrast Given 11/19/22 2146)     IMPRESSION / MDM / ASSESSMENT AND PLAN / ED COURSE  I reviewed the triage vital signs and the nursing notes.  Differential diagnosis includes, but is not limited to, arrhythmia, ACS, PE, pneumonia, pneumothorax, CHF exacerbation  Patient's presentation is most  consistent with acute presentation with potential threat to life or bodily function.  82 year old male presenting to the emergency department for evaluation of chest pain.  On presentation, found to be in a narrow templates tachycardia with variable heart rhythms.  Recently seen in the ER with suspected SVT, but I am also concerned about possible atrial arrhythmia.  Patient was given 50 mg of diltiazem with significant improvement in his heart rate to the 70s.  His repeat EKG demonstrates heart rate of 75, does appear to have some irregularity concerning for atrial fibrillation.  His initial troponin was normal, but repeat elevated at 70 and elevated D-dimer at 1.55.  CT of the chest was obtained without evidence of PE.  Normal potassium and magnesium.  Patient reevaluated.  He now appears to be in  sinus rhythm.  Chest pain has resolved.  However, given his recurrent episodes of narrow complex tachycardia as well as his increasing troponin, do think patient is appropriate for admission for further evaluation.  Will discuss with hospitalist team.  Case discussed with hospitalist team.  They will evaluate the patient for anticipated admission.     FINAL CLINICAL IMPRESSION(S) / ED DIAGNOSES   Final diagnoses:  Acute chest pain  Atrial arrhythmia     Rx / DC Orders   ED Discharge Orders     None        Note:  This document was prepared using Dragon voice recognition software and may include unintentional dictation errors.   Trinna Post, MD 11/20/22 0010

## 2022-11-19 NOTE — Progress Notes (Signed)
PHARMACIST - PHYSICIAN COMMUNICATION  CONCERNING:  Enoxaparin (Lovenox) for DVT Prophylaxis    RECOMMENDATION: Patient was prescribed enoxaprin 40mg  q24 hours for VTE prophylaxis.   Filed Weights   11/19/22 1932  Weight: 111.1 kg (245 lb)    Body mass index is 34.17 kg/m.  Estimated Creatinine Clearance: 63.9 mL/min (by C-G formula based on SCr of 1.11 mg/dL).   Based on [facility] policy patient is candidate for enoxaparin 0.5mg /kg TBW SQ every 24 hours based on BMI being >30.  DESCRIPTION: Pharmacy has adjusted enoxaparin dose per [facility] policy.  Patient is now receiving enoxaparin 0.5 mg/kg every 24 hours   Otelia Sergeant, PharmD, [facility] 11/19/2022 11:51 PM

## 2022-11-20 ENCOUNTER — Observation Stay
Admit: 2022-11-20 | Discharge: 2022-11-20 | Disposition: A | Discharge status: Home / Self Care | Payer: PPO | Attending: Family Medicine | Admitting: Family Medicine

## 2022-11-20 DIAGNOSIS — I10 Essential (primary) hypertension: Secondary | ICD-10-CM | POA: Diagnosis not present

## 2022-11-20 DIAGNOSIS — K219 Gastro-esophageal reflux disease without esophagitis: Secondary | ICD-10-CM | POA: Diagnosis present

## 2022-11-20 DIAGNOSIS — I498 Other specified cardiac arrhythmias: Secondary | ICD-10-CM | POA: Diagnosis present

## 2022-11-20 DIAGNOSIS — R0989 Other specified symptoms and signs involving the circulatory and respiratory systems: Secondary | ICD-10-CM | POA: Diagnosis not present

## 2022-11-20 DIAGNOSIS — I420 Dilated cardiomyopathy: Secondary | ICD-10-CM | POA: Diagnosis not present

## 2022-11-20 DIAGNOSIS — F419 Anxiety disorder, unspecified: Secondary | ICD-10-CM | POA: Diagnosis not present

## 2022-11-20 DIAGNOSIS — J449 Chronic obstructive pulmonary disease, unspecified: Secondary | ICD-10-CM | POA: Diagnosis not present

## 2022-11-20 DIAGNOSIS — I5033 Acute on chronic diastolic (congestive) heart failure: Secondary | ICD-10-CM | POA: Diagnosis not present

## 2022-11-20 DIAGNOSIS — I110 Hypertensive heart disease with heart failure: Secondary | ICD-10-CM | POA: Diagnosis not present

## 2022-11-20 DIAGNOSIS — M7989 Other specified soft tissue disorders: Secondary | ICD-10-CM | POA: Diagnosis not present

## 2022-11-20 DIAGNOSIS — I471 Supraventricular tachycardia, unspecified: Secondary | ICD-10-CM | POA: Diagnosis not present

## 2022-11-20 DIAGNOSIS — I480 Paroxysmal atrial fibrillation: Secondary | ICD-10-CM | POA: Diagnosis not present

## 2022-11-20 DIAGNOSIS — E7800 Pure hypercholesterolemia, unspecified: Secondary | ICD-10-CM | POA: Diagnosis not present

## 2022-11-20 DIAGNOSIS — M79605 Pain in left leg: Secondary | ICD-10-CM | POA: Diagnosis not present

## 2022-11-20 DIAGNOSIS — M545 Low back pain, unspecified: Secondary | ICD-10-CM | POA: Diagnosis not present

## 2022-11-20 DIAGNOSIS — E669 Obesity, unspecified: Secondary | ICD-10-CM | POA: Diagnosis not present

## 2022-11-20 DIAGNOSIS — I700 Atherosclerosis of aorta: Secondary | ICD-10-CM | POA: Diagnosis not present

## 2022-11-20 DIAGNOSIS — R9389 Abnormal findings on diagnostic imaging of other specified body structures: Secondary | ICD-10-CM | POA: Diagnosis not present

## 2022-11-20 DIAGNOSIS — I5021 Acute systolic (congestive) heart failure: Secondary | ICD-10-CM | POA: Diagnosis not present

## 2022-11-20 DIAGNOSIS — I2489 Other forms of acute ischemic heart disease: Secondary | ICD-10-CM | POA: Diagnosis not present

## 2022-11-20 DIAGNOSIS — I252 Old myocardial infarction: Secondary | ICD-10-CM | POA: Diagnosis not present

## 2022-11-20 DIAGNOSIS — E039 Hypothyroidism, unspecified: Secondary | ICD-10-CM | POA: Diagnosis not present

## 2022-11-20 DIAGNOSIS — M109 Gout, unspecified: Secondary | ICD-10-CM | POA: Diagnosis present

## 2022-11-20 DIAGNOSIS — I2510 Atherosclerotic heart disease of native coronary artery without angina pectoris: Secondary | ICD-10-CM | POA: Diagnosis not present

## 2022-11-20 DIAGNOSIS — R079 Chest pain, unspecified: Secondary | ICD-10-CM | POA: Diagnosis not present

## 2022-11-20 DIAGNOSIS — I25810 Atherosclerosis of coronary artery bypass graft(s) without angina pectoris: Secondary | ICD-10-CM | POA: Diagnosis not present

## 2022-11-20 DIAGNOSIS — M25551 Pain in right hip: Secondary | ICD-10-CM | POA: Diagnosis not present

## 2022-11-20 DIAGNOSIS — I739 Peripheral vascular disease, unspecified: Secondary | ICD-10-CM | POA: Diagnosis not present

## 2022-11-20 DIAGNOSIS — Z96641 Presence of right artificial hip joint: Secondary | ICD-10-CM | POA: Diagnosis not present

## 2022-11-20 DIAGNOSIS — M79651 Pain in right thigh: Secondary | ICD-10-CM | POA: Diagnosis not present

## 2022-11-20 DIAGNOSIS — I959 Hypotension, unspecified: Secondary | ICD-10-CM | POA: Diagnosis not present

## 2022-11-20 DIAGNOSIS — I4510 Unspecified right bundle-branch block: Secondary | ICD-10-CM | POA: Diagnosis not present

## 2022-11-20 DIAGNOSIS — Z66 Do not resuscitate: Secondary | ICD-10-CM | POA: Diagnosis not present

## 2022-11-20 DIAGNOSIS — G8929 Other chronic pain: Secondary | ICD-10-CM | POA: Diagnosis not present

## 2022-11-20 DIAGNOSIS — E785 Hyperlipidemia, unspecified: Secondary | ICD-10-CM | POA: Diagnosis present

## 2022-11-20 DIAGNOSIS — G4730 Sleep apnea, unspecified: Secondary | ICD-10-CM | POA: Diagnosis not present

## 2022-11-20 DIAGNOSIS — Z950 Presence of cardiac pacemaker: Secondary | ICD-10-CM | POA: Diagnosis not present

## 2022-11-20 DIAGNOSIS — I495 Sick sinus syndrome: Secondary | ICD-10-CM | POA: Diagnosis not present

## 2022-11-20 DIAGNOSIS — J9811 Atelectasis: Secondary | ICD-10-CM | POA: Diagnosis not present

## 2022-11-20 DIAGNOSIS — I4891 Unspecified atrial fibrillation: Secondary | ICD-10-CM | POA: Diagnosis not present

## 2022-11-20 LAB — ECHOCARDIOGRAM COMPLETE
AR max vel: 1.99 cm2
AV Area VTI: 2.32 cm2
AV Area mean vel: 2.01 cm2
AV Mean grad: 2 mmHg
AV Peak grad: 3.4 mmHg
Ao pk vel: 0.92 m/s
Area-P 1/2: 3.06 cm2
Height: 71 in
S' Lateral: 4.5 cm
Single Plane A4C EF: 50.7 %
Weight: 3869.51 oz

## 2022-11-20 LAB — TROPONIN I (HIGH SENSITIVITY)
Troponin I (High Sensitivity): 475 ng/L (ref <18)
Troponin I (High Sensitivity): 656 ng/L (ref <18)
Troponin I (High Sensitivity): 458 ng/L (ref <18)

## 2022-11-20 LAB — HEPARIN LEVEL (UNFRACTIONATED)
Heparin Unfractionated: 0.54 IU/mL (ref 0.30–0.70)
Heparin Unfractionated: 0.37 IU/mL (ref 0.30–0.70)

## 2022-11-20 MED ORDER — DILTIAZEM HCL 25 MG/5ML IV SOLN: 15.0000 mg | Freq: Once | INTRAVENOUS | Status: AC

## 2022-11-20 MED ORDER — LEVOTHYROXINE SODIUM 50 MCG PO TABS
75.0000 ug | ORAL_TABLET | Freq: Every day | ORAL | Status: DC
  Administered 2022-11-21 – 2022-11-23 (×3): 75 ug via ORAL
  Filled 2022-11-20 (×2): qty 2
  Filled 2022-11-20: qty 1

## 2022-11-20 MED ORDER — ALLOPURINOL 100 MG PO TABS
300.0000 mg | ORAL_TABLET | Freq: Every day | ORAL | Status: DC
  Administered 2022-11-20 – 2022-11-23 (×4): 300 mg via ORAL
  Filled 2022-11-20: qty 3
  Filled 2022-11-20: qty 1
  Filled 2022-11-20: qty 3
  Filled 2022-11-20: qty 1

## 2022-11-20 MED ORDER — AMIODARONE HCL IN DEXTROSE 360-4.14 MG/200ML-% IV SOLN: 30.0000 mg/h | INTRAVENOUS | Status: DC

## 2022-11-20 MED ORDER — ATORVASTATIN CALCIUM 20 MG PO TABS
80.0000 mg | ORAL_TABLET | Freq: Every day | ORAL | Status: DC
  Administered 2022-11-20 – 2022-11-21 (×2): 80 mg via ORAL
  Filled 2022-11-20: qty 4
  Filled 2022-11-20: qty 1

## 2022-11-20 MED ORDER — FERROUS SULFATE 325 (65 FE) MG PO TABS
325.0000 mg | ORAL_TABLET | Freq: Every day | ORAL | Status: DC
  Administered 2022-11-20 – 2022-11-23 (×4): 325 mg via ORAL
  Filled 2022-11-20 (×4): qty 1

## 2022-11-20 MED ORDER — HEPARIN BOLUS VIA INFUSION
4000.0000 [IU] | Freq: Once | INTRAVENOUS | Status: AC
  Administered 2022-11-20: 4000 [IU] via INTRAVENOUS
  Filled 2022-11-20: qty 4000

## 2022-11-20 MED ORDER — AMIODARONE LOAD VIA INFUSION
150.0000 mg | Freq: Once | INTRAVENOUS | Status: AC
  Administered 2022-11-20: 150 mg via INTRAVENOUS
  Filled 2022-11-20: qty 83.34

## 2022-11-20 MED ORDER — MAGNESIUM SULFATE 2 GM/50ML IV SOLN
2.0000 g | Freq: Once | INTRAVENOUS | Status: AC
  Administered 2022-11-20: 2 g via INTRAVENOUS
  Filled 2022-11-20: qty 50

## 2022-11-20 MED ORDER — HEPARIN (PORCINE) 25000 UT/250ML-% IV SOLN
1300.0000 [IU]/h | INTRAVENOUS | Status: DC
  Administered 2022-11-20 – 2022-11-21 (×2): 1300 [IU]/h via INTRAVENOUS
  Filled 2022-11-20 (×3): qty 250

## 2022-11-20 MED ORDER — METOPROLOL TARTRATE 25 MG PO TABS
25.0000 mg | ORAL_TABLET | Freq: Two times a day (BID) | ORAL | Status: DC
  Administered 2022-11-20: 25 mg via ORAL
  Filled 2022-11-20: qty 1

## 2022-11-20 MED ORDER — METOPROLOL TARTRATE 5 MG/5ML IV SOLN
2.5000 mg | Freq: Once | INTRAVENOUS | Status: AC
  Administered 2022-11-20: 2.5 mg via INTRAVENOUS
  Filled 2022-11-20: qty 5

## 2022-11-20 MED ORDER — POLYVINYL ALCOHOL 1.4 % OP SOLN: 1.0000 [drp] | Freq: Every day | OPHTHALMIC | Status: DC | PRN

## 2022-11-20 MED ORDER — DILTIAZEM HCL-DEXTROSE 125-5 MG/125ML-% IV SOLN (PREMIX)
INTRAVENOUS | Status: AC
  Administered 2022-11-20: 5 mg/h via INTRAVENOUS
  Filled 2022-11-20: qty 125

## 2022-11-20 MED ORDER — LOSARTAN POTASSIUM 50 MG PO TABS
100.0000 mg | ORAL_TABLET | Freq: Every day | ORAL | Status: DC
  Administered 2022-11-20 – 2022-11-23 (×4): 100 mg via ORAL
  Filled 2022-11-20 (×4): qty 2

## 2022-11-20 MED ORDER — METOPROLOL TARTRATE 25 MG PO TABS: 25.0000 mg | ORAL_TABLET | Freq: Two times a day (BID) | ORAL | Status: DC

## 2022-11-20 MED ORDER — DILTIAZEM HCL 25 MG/5ML IV SOLN
INTRAVENOUS | Status: AC
  Administered 2022-11-20: 15 mg via INTRAVENOUS
  Filled 2022-11-20: qty 5

## 2022-11-20 MED ORDER — AMIODARONE HCL IN DEXTROSE 360-4.14 MG/200ML-% IV SOLN
30.0000 mg/h | INTRAVENOUS | Status: DC
  Administered 2022-11-20 – 2022-11-21 (×3): 30 mg/h via INTRAVENOUS
  Filled 2022-11-20 (×3): qty 200

## 2022-11-20 MED ORDER — METOPROLOL TARTRATE 25 MG PO TABS
25.0000 mg | ORAL_TABLET | Freq: Three times a day (TID) | ORAL | Status: DC
  Administered 2022-11-20 (×2): 25 mg via ORAL
  Filled 2022-11-20 (×2): qty 1

## 2022-11-20 MED ORDER — ASPIRIN 81 MG PO TBEC
81.0000 mg | DELAYED_RELEASE_TABLET | Freq: Every day | ORAL | Status: DC
  Administered 2022-11-20 – 2022-11-22 (×3): 81 mg via ORAL
  Filled 2022-11-20 (×3): qty 1

## 2022-11-20 MED ORDER — VITAMIN B-12 1000 MCG PO TABS
1000.0000 ug | ORAL_TABLET | Freq: Every day | ORAL | Status: DC
  Administered 2022-11-20 – 2022-11-23 (×4): 1000 ug via ORAL
  Filled 2022-11-20 (×4): qty 1

## 2022-11-20 MED ORDER — PANTOPRAZOLE SODIUM 40 MG PO TBEC
40.0000 mg | DELAYED_RELEASE_TABLET | Freq: Every day | ORAL | Status: DC
  Administered 2022-11-20 – 2022-11-23 (×4): 40 mg via ORAL
  Filled 2022-11-20 (×4): qty 1

## 2022-11-20 MED ORDER — DILTIAZEM HCL-DEXTROSE 125-5 MG/125ML-% IV SOLN (PREMIX): 5.0000 mg/h | INTRAVENOUS | Status: DC

## 2022-11-20 MED ORDER — MORPHINE SULFATE (PF) 2 MG/ML IV SOLN
2.0000 mg | INTRAVENOUS | Status: DC | PRN
  Administered 2022-11-22 (×2): 2 mg via INTRAVENOUS

## 2022-11-20 MED ORDER — NITROGLYCERIN 0.4 MG SL SUBL: 0.4000 mg | SUBLINGUAL_TABLET | SUBLINGUAL | Status: DC | PRN

## 2022-11-20 MED ORDER — METOPROLOL TARTRATE 25 MG PO TABS: 25.0000 mg | ORAL_TABLET | Freq: Three times a day (TID) | ORAL | Status: DC

## 2022-11-20 MED ORDER — AMIODARONE HCL IN DEXTROSE 360-4.14 MG/200ML-% IV SOLN
60.0000 mg/h | INTRAVENOUS | Status: DC
  Administered 2022-11-20: 60 mg/h via INTRAVENOUS
  Filled 2022-11-20: qty 200

## 2022-11-20 MED ORDER — LABETALOL HCL 5 MG/ML IV SOLN
20.0000 mg | INTRAVENOUS | Status: DC | PRN
  Administered 2022-11-21: 20 mg via INTRAVENOUS
  Filled 2022-11-20: qty 4

## 2022-11-20 MED ORDER — CLOPIDOGREL BISULFATE 75 MG PO TABS
75.0000 mg | ORAL_TABLET | Freq: Every day | ORAL | Status: DC
  Administered 2022-11-20 – 2022-11-23 (×4): 75 mg via ORAL
  Filled 2022-11-20 (×4): qty 1

## 2022-11-20 MED ORDER — DOXAZOSIN MESYLATE 2 MG PO TABS
2.0000 mg | ORAL_TABLET | Freq: Every day | ORAL | Status: DC
  Administered 2022-11-20 – 2022-11-23 (×4): 2 mg via ORAL
  Filled 2022-11-20 (×4): qty 1

## 2022-11-20 NOTE — Progress Notes (Signed)
ANTICOAGULATION CONSULT NOTE  Pharmacy Consult for heparin infusion Indication: ACS/STEMI  Allergies  Allergen Reactions   Niacin Other (See Comments)    Leg cramps   Oxycodone Other (See Comments)    Makes him loopy and out of control.  Does not like the sensation.    Prednisone Other (See Comments)    Severe pain D/T stopping prednisone immediately rather than weaning it.     Patient Measurements: Height: 5\' 11"  (180.3 cm) Weight: 109.7 kg (241 lb 13.5 oz) IBW/kg (Calculated) : 75.3 Heparin Dosing Weight: 99.2 kg  Vital Signs: Temp: 98.3 F (36.8 C) (08/12 1200) Temp Source: Oral (08/12 1200) BP: 138/95 (08/12 1344) Pulse Rate: 161 (08/12 1344)  Labs: Recent Labs    11/21/2022 1952 11/30/2022 2147 11/20/22 0233 11/20/22 0426 11/20/22 0614 11/20/22 0942  HGB 13.7  --   --  13.3  --   --   HCT 42.0  --   --  42.2  --   --   PLT 201  --   --  183  --   --   HEPARINUNFRC  --   --   --   --  0.54  --   CREATININE 1.11  --   --  0.98  --   --   TROPONINIHS 11   < > 475* 656*  --  458*   < > = values in this interval not displayed.    Estimated Creatinine Clearance: 72 mL/min (by C-G formula based on SCr of 0.98 mg/dL).   Medical History: Past Medical History:  Diagnosis Date   AAA (abdominal aortic aneurysm) (HCC)    s/p EVAR in 2008   Anemia    iron deficiency   Anxiety    Aortic atherosclerosis (HCC)    Arthritis    Chronic cough    since teeanage years   Complication of anesthesia    woke up during AAA repair. Had an epidural and does NOT want again.   COPD (chronic obstructive pulmonary disease) (HCC)    Coronary artery disease    Diverticulosis    Dyspnea    GERD (gastroesophageal reflux disease)    Gout    Hx of CABG 09/1991   Hx of laryngeal cancer 2015   s/p surgery and XRT   Hypercholesterolemia    Hypertension    Hypothyroidism    Myocardial infarction (HCC) 1993   Obesity (BMI 30.0-34.9) 08/11/2019   PONV (postoperative nausea and  vomiting)    Several times, vomitting for hours after surgery.   PVD (peripheral vascular disease) (HCC)    Sleep apnea    Spinal stenosis    TIA (transient ischemic attack)     Assessment: Pt is a 82 yo male presenting to ED d/t worsening chronic cough, found with elevated BNP and troponin I, trending up.   Goal of Therapy:  Heparin level 0.3-0.7 units/ml Monitor platelets by anticoagulation protocol: Yes   Results 08/12 @ 0614 HL 0.54, therapeutic at 1300 un/hr (done early)  Plan:  Continue heparin infusion at 1300 units/hr Will check HL in 8 hr to confirm therapeutic rate CBC daily while on heparin   Rodriguez-Guzman PharmD, BCPS 11/20/2022 3:14 PM

## 2022-11-20 NOTE — H&P (Addendum)
[facility]   PATIENT NAME: Jesse Allison    MR#:  [id number]  DATE OF BIRTH:  [birth year]  DATE OF ADMISSION:  11/19/2022  PRIMARY CARE PHYSICIAN: Lynnea Ferrier, MD   Patient is coming from: Home  REQUESTING/REFERRING PHYSICIAN: Trinna Post, MD  CHIEF COMPLAINT:   Chief Complaint  Patient presents with   Chest Pain    Patent In by PV with c/o of chest pain that started aound 5pm this afternoon. Patient took rounds of nitroglycerin. Patient currently in SVT with HR in the 150's      HISTORY OF PRESENT ILLNESS:  Jesse Allison is a 82 y.o. Caucasian male with medical history significant for coronary artery disease status post three-vessel CABG, COPD, GERD, hypertension, dyslipidemia and hypothyroidism, presented to the emergency room with acute onset of chest pain graded 5/10 in severity and felt as pressure between shoulder blades with associated diaphoresis, jitteriness and weakness.  The patient admitted to nausea without vomiting.  He denied any palpitations with this chest pain.  No cough or wheezing or hemoptysis.  No bleeding diathesis.  No abdominal pain or melena or bright red bleeding per rectum.  No dysuria, oliguria or hematuria or flank pain.  ED Course: When the patient came to the ER, BP was 152/102 with heart rate of 151 with otherwise normal vital signs.  Labs revealed BUN 28 with otherwise unremarkable CMP.  CBC was within normal.  D-dimer was 1.55. EKG as reviewed by me : Initial EKG showed atrial fibrillation with rapid ventricular sponsor 140 with incomplete right bundle branch block, anteroseptal Q waves and Q waves inferiorly.  Repeat EKG later showed normal sinus rhythm with a rate of 78 with PVCs, prolonged PR interval and Q waves inferiorly. Imaging: Chest CTA revealed no evidence for PE and no acute cardiopulmonary disease.  It showed aortic atherosclerosis.  The patient was given 15 mg of IV Cardizem that brought the heart rate down to 72 in sinus  rhythm.  He will be admitted to progressive unit observation bed for further evaluation and management. PAST MED medical ICAL H CK ISTORY:   Past Medical History:  Diagnosis Date   AAA (abdominal aortic aneurysm) (HCC)    s/p EVAR in 2008   Anemia    iron deficiency   Anxiety    Aortic atherosclerosis (HCC)    Arthritis    Chronic cough    since teeanage years   Complication of anesthesia    woke up during AAA repair. Had an epidural and does NOT want again.   COPD (chronic obstructive pulmonary disease) (HCC)    Coronary artery disease    Diverticulosis    Dyspnea    GERD (gastroesophageal reflux disease)    Gout    Hx of CABG 09/1991   Hx of laryngeal cancer 2015   s/p surgery and XRT   Hypercholesterolemia    Hypertension    Hypothyroidism    Myocardial infarction (HCC) 1993   Obesity (BMI 30.0-34.9) 08/11/2019   PONV (postoperative nausea and vomiting)    Several times, vomitting for hours after surgery.   PVD (peripheral vascular disease) (HCC)    Sleep apnea    Spinal stenosis    TIA (transient ischemic attack)     PAST SURGICAL HISTORY:   Past Surgical History:  Procedure Laterality Date   ABDOMINAL AORTIC ANEURYSM REPAIR  2010   no longer followed on a regular basis   BACK SURGERY  2000  L4 . piece of vertebrae broke off and lodged into spine. had to remove the loose piece.   CARDIAC CATHETERIZATION     CATARACT EXTRACTION W/PHACO Left 12/12/2016   Procedure: CATARACT EXTRACTION PHACO AND INTRAOCULAR LENS PLACEMENT (IOC);  Surgeon: Galen Manila, MD;  Location: [facility] ORS;  Service: Ophthalmology;  Laterality: Left;  [place] 00:38.1 AP% 14.1 CDE 5.38 FLUID PACK LOT # [id number]   CATARACT EXTRACTION W/PHACO Right 12/26/2016   Procedure: CATARACT EXTRACTION PHACO AND INTRAOCULAR LENS PLACEMENT (IOC);  Surgeon: Galen Manila, MD;  Location: [facility] OR[facility];  Service: Ophthalmology;  Laterality: Right;  [place] 00:53.7 AP% 12.9 CDE 6.92 Fluid Pack Lot # [id number]    COLONOSCOPY WITH PROPOFOL N/A 06/22/2020   Procedure: COLONOSCOPY WITH PROPOFOL;  Surgeon: Regis Bill, MD;  Location: [facility];  Service: Endoscopy;  Laterality: N/A;   CORONARY ARTERY BYPASS GRAFT  1993   EYE SURGERY Bilateral    LARYNGOSCOPY  2015   tumor removal followed by radiation treatments.   ROBOTIC ASSISTED LAPAROSCOPIC LYSIS OF ADHESION  07/15/2020   Procedure: XI ROBOTIC ASSISTED LAPAROSCOPIC LYSIS OF ADHESION;  Surgeon: Sung Amabile, DO;  Location: [facility] OR[facility];  Service: General;;   TESTICLAR CYST EXCISION     TOTAL HIP ARTHROPLASTY Right 08/21/2019   Procedure: TOTAL HIP ARTHROPLASTY ANTERIOR APPROACH;  Surgeon: Kennedy Bucker, MD;  Location: [facility] OR[facility];  Service: Orthopedics;  Laterality: Right;    SOCIAL HISTORY:   Social History   Tobacco Use   Smoking status: Former    Current packs/day: 0.00    Types: Cigarettes    Quit date: 2010    Years since quitting: 14.6   Smokeless tobacco: Never  Substance Use Topics   Alcohol use: Yes    Comment: couple of glasses of wine per day.    FAMILY HISTORY:   Family History  Problem Relation Age of Onset   Leukemia Mother    Heart attack Father    Stroke Sister     DRUG ALLERGIES:   Allergies  Allergen Reactions   Niacin Other (See Comments)    Leg cramps   Oxycodone Other (See Comments)    Makes him loopy and out of control.  Does not like the sensation.    Prednisone Other (See Comments)    Severe pain D/T stopping prednisone immediately rather than weaning it.     REVIEW OF SYSTEMS:   ROS As per history of present illness. All pertinent systems were reviewed above. Constitutional, HEENT, cardiovascular, respiratory, GI, GU, musculoskeletal, neuro, psychiatric, endocrine, integumentary and hematologic systems were reviewed and are otherwise negative/unremarkable except for positive findings mentioned above in the HPI.   MEDICATIONS AT HOME:   Prior to Admission medications   Medication Sig Start  Date End Date Taking? Authorizing Provider  allopurinol (ZYLOPRIM) 300 MG tablet Take 1 tablet by mouth daily. 12/27/20  Yes [provider]  aspirin EC 81 MG tablet Take 1 tablet (81 mg total) by mouth in the morning and at bedtime. Patient taking differently: Take 81 mg by mouth daily. 08/22/19  Yes Evon Slack, PA-C  azelastine (ASTELIN) 0.1 % nasal spray Place 1-2 sprays into both nostrils 2 (two) times daily as needed for allergies.  04/13/18  Yes [provider]  bismuth subsalicylate (PEPTO BISMOL) 262 MG/15ML suspension Take 30 mLs by mouth every 6 (six) hours as needed for indigestion.   Yes [provider]  calcium carbonate (OSCAL) 1500 (600 Ca) MG TABS tablet Take 600 mg of elemental calcium  by mouth at bedtime.    Yes [provider]  Carboxymethylcellulose Sodium (LUBRICANT EYE DROPS OP) Place 1 drop into both eyes daily as needed (dry eye).   Yes [provider]  carvedilol (COREG) 3.125 MG tablet Take 6.25 mg by mouth 2 (two) times daily. 10/15/19  Yes [provider]  clopidogrel (PLAVIX) 75 MG tablet Take 75 mg by mouth daily. 01/23/19  Yes [provider]  cyanocobalamin 1000 MCG tablet Take 1,000 mcg by mouth daily after lunch.    Yes [provider]  doxazosin (CARDURA) 2 MG tablet Take 2 mg by mouth at bedtime. 06/19/22  Yes [provider]  furosemide (LASIX) 40 MG tablet Take 40 mg by mouth daily. 07/17/19  Yes [provider]  Iron, Ferrous Sulfate, 142 (45 Fe) MG TBCR Take 45 mg by mouth daily with lunch.    Yes [provider]  levothyroxine (SYNTHROID) 50 MCG tablet Take 75 mcg by mouth daily. 04/08/21  Yes [provider]  losartan (COZAAR) 100 MG tablet Take 100 mg by mouth at bedtime. 04/08/21  Yes [provider]  lovastatin (MEVACOR) 20 MG tablet Take 20 mg by mouth at bedtime. 04/08/21  Yes [provider]  Misc Natural Products (OSTEO BI-FLEX  JOINT SHIELD PO) Take 2 tablets by mouth daily.   Yes [provider]  nitroGLYCERIN (NITROSTAT) 0.4 MG SL tablet Place 0.4 mg under the tongue every 5 (five) minutes as needed for chest pain. 06/24/19  Yes [provider]  pantoprazole (PROTONIX) 40 MG tablet Take 40 mg by mouth daily before supper. 07/17/19  Yes [provider]  potassium chloride (KLOR-CON) 10 MEQ tablet Take 10 mEq by mouth daily. 01/23/19  Yes [provider]      VITAL SIGNS:  Blood pressure (!) 167/82, pulse (!) 57, temperature 98.6 F (37 C), temperature source Oral, resp. rate 20, height 5\' 11"  (1.803 m), weight 111.1 kg, SpO2 95%.  PHYSICAL EXAMINATION:  Physical Exam  GENERAL:  82 y.o.-year-old patient lying in the bed with no acute distress.  EYES: Pupils equal, round, reactive to light and accommodation. No scleral icterus. Extraocular muscles intact.  HEENT: Head atraumatic, normocephalic. Oropharynx and nasopharynx clear.  NECK:  Supple, no jugular venous distention. No thyroid enlargement, no tenderness.  LUNGS: Normal breath sounds bilaterally, no wheezing, rales,rhonchi or crepitation. No use of accessory muscles of respiration.  CARDIOVASCULAR: Regular rate and rhythm, S1, S2 normal. No murmurs, rubs, or gallops.  ABDOMEN: Soft, nondistended, nontender. Bowel sounds present. No organomegaly or mass.  EXTREMITIES: No pedal edema, cyanosis, or clubbing.  NEUROLOGIC: Cranial nerves II through XII are intact. Muscle strength 5/5 in all extremities. Sensation intact. Gait not checked.  PSYCHIATRIC: The patient is alert and oriented x 3.  Normal affect and good eye contact. SKIN: No obvious rash, lesion, or ulcer.   LABORATORY PANEL:   CBC Recent Labs  Lab 11/19/22 1952  WBC 9.2  HGB 13.7  HCT 42.0  PLT 201   ------------------------------------------------------------------------------------------------------------------  Chemistries  Recent Labs  Lab  11/19/22 1952 11/19/22 2147  NA 140  --   K 4.0  --   CL 102  --   CO2 28  --   GLUCOSE 134*  --   BUN 28*  --   CREATININE 1.11  --   CALCIUM 9.0  --   MG  --  2.0  AST 14*  --   ALT 13  --   ALKPHOS 70  --  BILITOT 0.8  --    ------------------------------------------------------------------------------------------------------------------  Cardiac Enzymes No results for input(s): "TROPONINI" in the last 168 hours. ------------------------------------------------------------------------------------------------------------------  RADIOLOGY:  CT Angio Chest PE W and/or Wo Contrast  Result Date: 11/19/2022 CLINICAL DATA:  Chest pain, evaluate for PE EXAM: CT ANGIOGRAPHY CHEST WITH CONTRAST TECHNIQUE: Multidetector CT imaging of the chest was performed using the standard protocol during bolus administration of intravenous contrast. Multiplanar CT image reconstructions and MIPs were obtained to evaluate the vascular anatomy. RADIATION DOSE REDUCTION: This exam was performed according to the departmental dose-optimization program which includes automated exposure control, adjustment of the mA and/or kV according to patient size and/or use of iterative reconstruction technique. CONTRAST:  75mL OMNIPAQUE IOHEXOL 350 MG/ML SOLN COMPARISON:  Chest radiograph dated 11/13/2022. FINDINGS: Cardiovascular: Satisfactory opacification of the bilateral pulmonary arteries to the segmental level. Bilateral lower lobe pulmonary arteries are mildly motion degraded. Within that constraint, there is no evidence of pulmonary embolism. Study is not tailored for evaluation of the thoracic aorta. No evidence of thoracic aortic aneurysm. Atherosclerotic calcifications of the arch. Mild cardiomegaly. No pericardial effusion. Postsurgical changes related to prior CABG. Severe three-vessel coronary atherosclerosis. Mediastinum/Nodes: No suspicious mediastinal lymphadenopathy. Visualized thyroid is unremarkable.  Lungs/Pleura: Evaluation of the lung parenchyma is constrained by respiratory motion, particularly within the bilateral lower lobes. Within that constraint, there are no suspicious pulmonary nodules. Mild scarring/atelectasis in the lingula and bilateral lower lobes. Associated eventration of the left hemidiaphragm. No focal consolidation. No pleural effusion or pneumothorax. Upper Abdomen: Is notable for an aortic stent, vascular calcifications, and multiple simple left renal cysts measuring up to 4.5 cm (series 4/image 153). No follow-up is recommended. Musculoskeletal: Degenerative changes of the visualized thoracolumbar spine. Median sternotomy. Review of the MIP images confirms the above findings. IMPRESSION: No evidence of pulmonary embolism. No acute cardiopulmonary disease. Aortic Atherosclerosis (ICD10-I70.0). Electronically Signed   By: Charline Bills M.D.   On: 11/19/2022 21:52      IMPRESSION AND PLAN:  Assessment and Plan: * Chest pain - The patient will be admitted to an observation cardiac telemetry bed. - The patient has a history of coronary artery disease status post three-vessel CABG. - Will follow serial troponins and EKGs.  His repeat troponin was 70.  This could be still related to his atrial fibrillation with RVR. - The patient will be continued on aspirin and Plavix as well as placed on p.r.n. sublingual nitroglycerin and morphine sulfate for pain. - We will obtain a cardiology consult in a.m. for further cardiac risk stratification. - I notified Dr. Juliann Pares about the patient   Paroxysmal atrial fibrillation with RVR (HCC) - She responded to IV Cardizem bolus. - He is currently in normal sinus rhythm. - Will continue monitoring for recurrent atrial fibrillation. - Cardiology consult will be obtained as well as 2D echo. - I notified Dr. Juliann Pares as mentioned above.  Essential hypertension - We will continue he his antihypertensives.  Dyslipidemia - We will  continue statin therapy.  Gout - We will continue allopurinol.  GERD without esophagitis - We will continue PPI therapy.    DVT prophylaxis: Lovenox.  Advanced Care Planning:  Code Status: He is DNR only. Family Communication:  The plan of care was discussed in details with the patient (and family). I answered all questions. The patient agreed to proceed with the above mentioned plan. Further management will depend upon hospital course. Disposition Plan: Back to previous home environment Consults called: Cardiology All the records are reviewed and case  discussed with ED provider.  Status is: Observation  I certify that at the time of admission, it is my clinical judgment that the patient will require hospital care extending less than 2 midnights.                            Dispo: The patient is from: Home              Anticipated d/c is to: Home              Patient currently is not medically stable to d/c.              Difficult to place patient: No  Hannah Beat M.D on 11/20/2022 at 2:50 AM  [facility]   From 7 PM-7 AM, contact night-coverage [email or web address]  CC: Primary care physician; Lynnea Ferrier, MD

## 2022-11-20 NOTE — Assessment & Plan Note (Addendum)
-   We will continue he his antihypertensives.

## 2022-11-20 NOTE — Progress Notes (Signed)
  Progress Note   Patient: Jesse Allison HKV:[id number] DOB: November 05, 1940 DOA: 11/19/2022     0 DOS: the patient was seen and examined on 11/20/2022   Brief hospital course: Taken from H&P.  Jesse Allison is a 82 y.o. Caucasian male with medical history significant for coronary artery disease status post three-vessel CABG, COPD, GERD, hypertension, dyslipidemia and hypothyroidism, presented to the emergency room with acute onset of chest pain graded 5/10 in severity and felt as pressure between shoulder blades with associated diaphoresis, jitteriness and weakness.  The patient admitted to nausea without vomiting.   Patient was tachycardic on presentation, EKG shows A-fib with RVR, incomplete right bundle branch block, anteroseptal and inferior Q waves. CTA chest with no PE or acute cardiopulmonary disease.  Patient had recurrent atrial fibrillation overnight.  Received Cardizem bolus followed by Cardizem infusion.  Troponin trending up so he was started on heparin infusion.  Cardiology was consulted.  8/12: Vitals and labs stable, troponin peaked at 656, likely secondary to demand ischemia with going in and out of A-fib with RVR, BNP 120.  Echocardiogram pending. Cardiology started amiodarone infusion    Assessment and Plan: * Chest pain The patient has a history of coronary artery disease status post three-vessel CABG. troponin peaked at 656, likely secondary to demand ischemia with multiple episodes of A-fib with RVR. Cardiology is on board -Continue with heparin infusion -Continue with supportive care   Paroxysmal atrial fibrillation with RVR (HCC) Initially responded to Cardizem infusion.  Going in and out of A-fib with RVR. Cardiology started amiodarone infusion Metoprolol dose was increased Currently on heparin infusion Echocardiogram pending  Essential hypertension - We will continue he his antihypertensives.  Dyslipidemia - We will continue statin  therapy.  Hypothyroidism TSH normal. -Continue home Synthroid  Gout - We will continue allopurinol.  GERD without esophagitis - We will continue PPI therapy.   Subjective: Patient was feeling improved during morning rounds.  Feeling some chest pressure whenever goes in A-fib and developed RVR.  Multiple family members at bedside  Physical Exam: Vitals:   11/20/22 1000 11/20/22 1100 11/20/22 1200 11/20/22 1300  BP: (!) 181/74 (!) 178/92 (!) 168/85 (!) 176/82  Pulse: (!) 54 (!) 59 (!) 57 (!) 53  Resp: 20 (!) 21 19 16   Temp:   98.3 F (36.8 C)   TempSrc:   Oral   SpO2: 95% 96% 95% 98%  Weight:      Height:       General.  Obese elderly man, in no acute distress. Pulmonary.  Lungs clear bilaterally, normal respiratory effort. CV.  Regular rate and rhythm, no JVD, rub or murmur.  Later developed A-fib with RVR Abdomen.  Soft, nontender, nondistended, BS positive. CNS.  Alert and oriented .  No focal neurologic deficit. Extremities.  No edema, no cyanosis, pulses intact and symmetrical. Psychiatry.  Judgment and insight appears normal.   Data Reviewed: Prior data reviewed  Family Communication: Discussed with wife and 2 daughters at bedside  Disposition: Status is: Inpatient Remains inpatient appropriate because: Severity of illness  Planned Discharge Destination: Home  DVT prophylaxis.  Heparin GTT Time spent:  minutes  This record has been created using Conservation officer, historic buildings. Errors have been sought and corrected,but may not always be located. Such creation errors do not reflect on the standard of care.   Author: Arnetha Courser, MD 11/20/2022 1:49 PM  For on call review [email or web address].

## 2022-11-20 NOTE — Progress Notes (Signed)
   11/20/22 1600  Spiritual Encounters  Type of Visit Initial  Care provided to: Family  Referral source Nurse (RN/NT/LPN)  Reason for visit Advance directives  OnCall Visit Yes   Chaplain was asked to assist with changing status from DNR to Full Code. Chaplain informed nurse of the need for this change who then submitted request to Doctor.

## 2022-11-20 NOTE — Hospital Course (Addendum)
Taken from H&P.  Jesse Allison is a 82 y.o. Caucasian male with medical history significant for coronary artery disease status post three-vessel CABG, COPD, GERD, hypertension, dyslipidemia and hypothyroidism, presented to the emergency room with acute onset of chest pain graded 5/10 in severity and felt as pressure between shoulder blades with associated diaphoresis, jitteriness and weakness.  The patient admitted to nausea without vomiting.   Patient was tachycardic on presentation, EKG shows A-fib with RVR, incomplete right bundle branch block, anteroseptal and inferior Q waves. CTA chest with no PE or acute cardiopulmonary disease.  Patient had recurrent atrial fibrillation overnight.  Received Cardizem bolus followed by Cardizem infusion.  Troponin trending up so he was started on heparin infusion.  Cardiology was consulted.  8/12: Vitals and labs stable, troponin peaked at 656, likely secondary to demand ischemia with going in and out of A-fib with RVR, BNP 120.  Echocardiogram pending. Cardiology started amiodarone infusion.  8/13: Patient with recurrent in and out of A-fib with RVR, becoming significantly bradycardic in between, concern of tachybradycardia syndrome.  He was transferred to stepdown.  Cardiology is planning pacemaker placement tomorrow morning.  Echocardiogram with EF of 45 to 50%, regional wall motion abnormalities, dilated cardiomyopathy and grade 1 diastolic dysfunction.

## 2022-11-20 NOTE — Assessment & Plan Note (Signed)
TSH normal. ?Continue home Synthroid ?

## 2022-11-20 NOTE — Assessment & Plan Note (Signed)
-   We will continue PPI therapy 

## 2022-11-20 NOTE — ED Notes (Signed)
Hospitalist paged d/t Troponin 475

## 2022-11-20 NOTE — Progress Notes (Addendum)
Pt HR is runnng from 47 to 67 non sustain.Pt is on cardizem drip at 2.5 mg/hr. MD Callwood made aware. Will continue to monitor.  Update 0716: Cardizem stopped at this time. HR is sustaining at 47-54. Incoming shift MD Callwood made aware. Will continue to monitor.

## 2022-11-20 NOTE — Progress Notes (Signed)
*  PRELIMINARY RESULTS* Echocardiogram 2D Echocardiogram has been performed.  Cristela Blue 11/20/2022, 8:36 AM

## 2022-11-20 NOTE — Assessment & Plan Note (Addendum)
Initially responded to Cardizem infusion.  Going in and out of A-fib with RVR. Cardiology started amiodarone infusion Metoprolol dose was increased Currently on heparin infusion Echocardiogram pending

## 2022-11-20 NOTE — Progress Notes (Signed)
ANTICOAGULATION CONSULT NOTE  Pharmacy Consult for heparin infusion Indication: ACS/STEMI  Allergies  Allergen Reactions   Niacin Other (See Comments)    Leg cramps   Oxycodone Other (See Comments)    Makes him loopy and out of control.  Does not like the sensation.    Prednisone Other (See Comments)    Severe pain D/T stopping prednisone immediately rather than weaning it.     Patient Measurements: Height: 5\' 11"  (180.3 cm) Weight: 109.7 kg (241 lb 13.5 oz) IBW/kg (Calculated) : 75.3 Heparin Dosing Weight: 99.2 kg  Vital Signs: Temp: 98.3 F (36.8 C) (08/12 1200) Temp Source: Oral (08/12 1200) BP: 138/95 (08/12 1344) Pulse Rate: 161 (08/12 1344)  Labs: Recent Labs    11/19/22 1952 11/19/22 2147 11/20/22 0233 11/20/22 0426 11/20/22 0614 11/20/22 0942 11/20/22 1617  HGB 13.7  --   --  13.3  --   --   --   HCT 42.0  --   --  42.2  --   --   --   PLT 201  --   --  183  --   --   --   HEPARINUNFRC  --   --   --   --  0.54  --  0.37  CREATININE 1.11  --   --  0.98  --   --   --   TROPONINIHS 11   < > 475* 656*  --  458*  --    < > = values in this interval not displayed.    Estimated Creatinine Clearance: 72 mL/min (by C-G formula based on SCr of 0.98 mg/dL).   Medical History: Past Medical History:  Diagnosis Date   AAA (abdominal aortic aneurysm) (HCC)    s/p EVAR in 2008   Anemia    iron deficiency   Anxiety    Aortic atherosclerosis (HCC)    Arthritis    Chronic cough    since teeanage years   Complication of anesthesia    woke up during AAA repair. Had an epidural and does NOT want again.   COPD (chronic obstructive pulmonary disease) (HCC)    Coronary artery disease    Diverticulosis    Dyspnea    GERD (gastroesophageal reflux disease)    Gout    Hx of CABG 09/1991   Hx of laryngeal cancer 2015   s/p surgery and XRT   Hypercholesterolemia    Hypertension    Hypothyroidism    Myocardial infarction (HCC) 1993   Obesity (BMI 30.0-34.9)  08/11/2019   PONV (postoperative nausea and vomiting)    Several times, vomitting for hours after surgery.   PVD (peripheral vascular disease) (HCC)    Sleep apnea    Spinal stenosis    TIA (transient ischemic attack)     Assessment: Pt is a 82 yo male presenting to ED d/t worsening chronic cough, found with elevated BNP and troponin I, trending up.   8/12 1617 HL 0.37.   Goal of Therapy:  Heparin level 0.3-0.7 units/ml Monitor platelets by anticoagulation protocol: Yes   Plan:  Heparin level is therapeutic. Will continue heparin infusion at 1300 units/hr. Recheck heparin level and CBC with AM labs.   Paschal Dopp, PharmD, BCPS 11/20/2022 4:43 PM

## 2022-11-20 NOTE — Progress Notes (Signed)
ANTICOAGULATION CONSULT NOTE  Pharmacy Consult for heparin infusion Indication: ACS/STEMI  Allergies  Allergen Reactions   Niacin Other (See Comments)    Leg cramps   Oxycodone Other (See Comments)    Makes him loopy and out of control.  Does not like the sensation.    Prednisone Other (See Comments)    Severe pain D/T stopping prednisone immediately rather than weaning it.     Patient Measurements: Height: 5\' 11"  (180.3 cm) Weight: 111.1 kg (245 lb) IBW/kg (Calculated) : 75.3 Heparin Dosing Weight: 99.2 kg  Vital Signs: Temp: 98.6 F (37 C) (08/11 1942) Temp Source: Oral (08/11 1942) BP: 178/56 (08/12 0330) Pulse Rate: 56 (08/12 0330)  Labs: Recent Labs    11/19/22 1952 11/19/22 2147 11/20/22 0233  HGB 13.7  --   --   HCT 42.0  --   --   PLT 201  --   --   CREATININE 1.11  --   --   TROPONINIHS 11 70* 475*    Estimated Creatinine Clearance: 63.9 mL/min (by C-G formula based on SCr of 1.11 mg/dL).   Medical History: Past Medical History:  Diagnosis Date   AAA (abdominal aortic aneurysm) (HCC)    s/p EVAR in 2008   Anemia    iron deficiency   Anxiety    Aortic atherosclerosis (HCC)    Arthritis    Chronic cough    since teeanage years   Complication of anesthesia    woke up during AAA repair. Had an epidural and does NOT want again.   COPD (chronic obstructive pulmonary disease) (HCC)    Coronary artery disease    Diverticulosis    Dyspnea    GERD (gastroesophageal reflux disease)    Gout    Hx of CABG 09/1991   Hx of laryngeal cancer 2015   s/p surgery and XRT   Hypercholesterolemia    Hypertension    Hypothyroidism    Myocardial infarction (HCC) 1993   Obesity (BMI 30.0-34.9) 08/11/2019   PONV (postoperative nausea and vomiting)    Several times, vomitting for hours after surgery.   PVD (peripheral vascular disease) (HCC)    Sleep apnea    Spinal stenosis    TIA (transient ischemic attack)     Assessment: Pt is a 82 yo male  presenting to ED d/t worsening chronic cough, found with elevated BNP and troponin I, trending up.   Goal of Therapy:  Heparin level 0.3-0.7 units/ml Monitor platelets by anticoagulation protocol: Yes   Plan:  Bolus 4000 units x 1 Start heparin infusion at 1300 units/hr Will check HL in 8 hr after start of infusion CBC daily while on heparin  Otelia Sergeant, PharmD, [facility] 11/20/2022 3:49 AM

## 2022-11-20 NOTE — ED Notes (Signed)
Dr. Arville Care informed pt HR increased to 150's

## 2022-11-20 NOTE — Progress Notes (Addendum)
Notified pt is back in A FIB with rvr in 160s , with bp 193/90. Ordered 15 mg IV Cardizem followed by Cardizem  drip. He was started earlier on IV Heparin given high Troponin delta 70->475 with high dose statin. Echo and card consult ordered.  Heart rate went down to the 50s and therefore IV Cardizem drip will be tapered off.  Troponin went up to 656.  He was having chest pressure when tachycardia events.  Hemoglobin came back 7 and therefore he will be given another unit of packed red blood cells.

## 2022-11-20 NOTE — Assessment & Plan Note (Addendum)
The patient has a history of coronary artery disease status post three-vessel CABG. troponin peaked at 656, likely secondary to demand ischemia with multiple episodes of A-fib with RVR. Cardiology is on board -Continue with heparin infusion -Continue with supportive care

## 2022-11-20 NOTE — ED Notes (Signed)
Dr. Arville Care was informed of Troponin 658 and HR went from 160's to low 50's. Verbal order to decrease Cardizem gtt.

## 2022-11-20 NOTE — Plan of Care (Signed)

## 2022-11-20 NOTE — Consult Note (Signed)
[facility] CLINIC CARDIOLOGY CONSULT NOTE       Patient ID: Jesse Allison MRN: [id number] DOB/AGE: June 01, 1940 82 y.o.  Admit date: 11/19/2022 Referring Physician Dr. Valente David Primary Physician Dr. Graciela Husbands Primary Cardiologist Dr. Juliann Pares Reason for Consultation chest pain, elevated troponin, possible AF vs SVT  HPI: Jesse Allison is an 18yoM with a PMH of CAD s/p remote CABG (1990s), chronic HFrEF (35-40% with apical anteroseptal and anterior akinesis, 07/2019), COPD, AAA s/p EVAR (2008), HTN who presented to [facility] ED 11/19/2022 with a diaphoresis, chest pain that radiated to his shoulder blades associated with lightheadedness and weakness, different than his prior angina.  He presented similarly to [facility] ED on 8/5 where he was in SVT which terminated with vagal maneuver.  Cardiology is consulted for further assistance.  Patient presents presents with his wife and sister who contribute to the history.  He presented to [facility] on 8/5 feeling hot and sweaty, generally weak, and with chest discomfort and heart racing.  He was reportedly in supraventricular tachycardia in the ER and this terminated with a vagal maneuver without recurrence.  He was discharged without change in his medications and was scheduled for follow-up with Dr. Juliann Pares tomorrow.   He presented to [facility] ED yesterday afternoon in a similar fashion with diaphoresis and "feeling hot" with chest pain that radiated to between his shoulder blades, and his heart rate was in the 160s or 170s and what initially looked like SVT.  He converted back to NSR on a diltiazem drip and was bradycardic to the 50s, so the drip was turned off.  He has been in and out of narrow complex tachycardia throughout the day with some EKG strips that do have some irregularly irregular periods that look more like atrial fibrillation than true SVT.  He describes these symptoms as different than his prior angina, where his pain was "sharp and  squeezing" and "pressure-like."   At my time of evaluation this morning the patient is feeling okay, chest pain-free, without shortness of breath, lightheadedness or dizziness, palpitations or lower extremity edema.  He has been in his usual state of health without exertional chest pain, or recent illness.  Reportedly eating and drinking normally for him.  Labs are notable for potassium of 4.1, BUN/creatinine 24/0.98 and GFR of >60, magnesium 2.0.  High-sensitivity troponin trending 11, 70, 475, 656, and 458.  BNP borderline elevated at 120.  D-dimer was also elevated at 1.55.  TSH within normal limits at 2.8, no leukocytosis with WBC 7.7, H&H stable at 13.3/42.2, platelets 183.  CTA chest was negative for pulmonary embolus and without active cardiopulmonary disease.  Review of systems complete and found to be negative unless listed above     Past Medical History:  Diagnosis Date   AAA (abdominal aortic aneurysm) (HCC)    s/p EVAR in 2008   Anemia    iron deficiency   Anxiety    Aortic atherosclerosis (HCC)    Arthritis    Chronic cough    since teeanage years   Complication of anesthesia    woke up during AAA repair. Had an epidural and does NOT want again.   COPD (chronic obstructive pulmonary disease) (HCC)    Coronary artery disease    Diverticulosis    Dyspnea    GERD (gastroesophageal reflux disease)    Gout    Hx of CABG 09/1991   Hx of laryngeal cancer 2015   s/p surgery and XRT   Hypercholesterolemia  Hypertension    Hypothyroidism    Myocardial infarction (HCC) 1993   Obesity (BMI 30.0-34.9) 08/11/2019   PONV (postoperative nausea and vomiting)    Several times, vomitting for hours after surgery.   PVD (peripheral vascular disease) (HCC)    Sleep apnea    Spinal stenosis    TIA (transient ischemic attack)     Past Surgical History:  Procedure Laterality Date   ABDOMINAL AORTIC ANEURYSM REPAIR  2010   no longer followed on a regular basis   BACK SURGERY  2000    L4 . piece of vertebrae broke off and lodged into spine. had to remove the loose piece.   CARDIAC CATHETERIZATION     CATARACT EXTRACTION W/PHACO Left 12/12/2016   Procedure: CATARACT EXTRACTION PHACO AND INTRAOCULAR LENS PLACEMENT (IOC);  Surgeon: Galen Manila, MD;  Location: [facility] ORS;  Service: Ophthalmology;  Laterality: Left;  [place] 00:38.1 AP% 14.1 CDE 5.38 FLUID PACK LOT # [id number]   CATARACT EXTRACTION W/PHACO Right 12/26/2016   Procedure: CATARACT EXTRACTION PHACO AND INTRAOCULAR LENS PLACEMENT (IOC);  Surgeon: Galen Manila, MD;  Location: [facility] ORS;  Service: Ophthalmology;  Laterality: Right;  [place] 00:53.7 AP% 12.9 CDE 6.92 Fluid Pack Lot # [id number] H   COLONOSCOPY WITH PROPOFOL N/A 06/22/2020   Procedure: COLONOSCOPY WITH PROPOFOL;  Surgeon: Regis Bill, MD;  Location: [facility] ENDOSCOPY;  Service: Endoscopy;  Laterality: N/A;   CORONARY ARTERY BYPASS GRAFT  1993   EYE SURGERY Bilateral    LARYNGOSCOPY  2015   tumor removal followed by radiation treatments.   ROBOTIC ASSISTED LAPAROSCOPIC LYSIS OF ADHESION  07/15/2020   Procedure: XI ROBOTIC ASSISTED LAPAROSCOPIC LYSIS OF ADHESION;  Surgeon: Sung Amabile, DO;  Location: [facility] ORS;  Service: General;;   TESTICLAR CYST EXCISION     TOTAL HIP ARTHROPLASTY Right 08/21/2019   Procedure: TOTAL HIP ARTHROPLASTY ANTERIOR APPROACH;  Surgeon: Kennedy Bucker, MD;  Location: [facility] ORS;  Service: Orthopedics;  Laterality: Right;    Medications Prior to Admission  Medication Sig Dispense Refill Last Dose   allopurinol (ZYLOPRIM) 300 MG tablet Take 1 tablet by mouth daily.   11/19/2022 at 1200   aspirin EC 81 MG tablet Take 1 tablet (81 mg total) by mouth in the morning and at bedtime. (Patient taking differently: Take 81 mg by mouth daily.) 30 tablet 0 11/19/2022 at AM   azelastine (ASTELIN) 0.1 % nasal spray Place 1-2 sprays into both nostrils 2 (two) times daily as needed for allergies.    PRN at PRN   bismuth subsalicylate (PEPTO BISMOL)  262 MG/15ML suspension Take 30 mLs by mouth every 6 (six) hours as needed for indigestion.   PRN at PRN   calcium carbonate (OSCAL) 1500 (600 Ca) MG TABS tablet Take 600 mg of elemental calcium by mouth at bedtime.    11/18/2022 at Unknown   Carboxymethylcellulose Sodium (LUBRICANT EYE DROPS OP) Place 1 drop into both eyes daily as needed (dry eye).   PRN at PRN   carvedilol (COREG) 3.125 MG tablet Take 6.25 mg by mouth 2 (two) times daily.   11/19/2022 at 1700   clopidogrel (PLAVIX) 75 MG tablet Take 75 mg by mouth daily.   11/19/2022 at 1200   cyanocobalamin 1000 MCG tablet Take 1,000 mcg by mouth daily after lunch.    11/19/2022 at 1200   doxazosin (CARDURA) 2 MG tablet Take 2 mg by mouth at bedtime.   11/18/2022 at Unknown   furosemide (LASIX) 40 MG tablet Take 40 mg by mouth daily.  11/19/2022 at Unknown   Iron, Ferrous Sulfate, 142 (45 Fe) MG TBCR Take 45 mg by mouth daily with lunch.    11/19/2022 at 1200   levothyroxine (SYNTHROID) 50 MCG tablet Take 75 mcg by mouth daily.   11/19/2022 at AM   losartan (COZAAR) 100 MG tablet Take 100 mg by mouth at bedtime.   11/19/2022 at 1700   lovastatin (MEVACOR) 20 MG tablet Take 20 mg by mouth at bedtime.   11/19/2022 at 1700   Misc Natural Products (OSTEO BI-FLEX JOINT SHIELD PO) Take 2 tablets by mouth daily.   11/19/2022 at Unknown   nitroGLYCERIN (NITROSTAT) 0.4 MG SL tablet Place 0.4 mg under the tongue every 5 (five) minutes as needed for chest pain.   11/19/2022 at 1700   pantoprazole (PROTONIX) 40 MG tablet Take 40 mg by mouth daily before supper.   11/19/2022 at 1700   potassium chloride (KLOR-CON) 10 MEQ tablet Take 10 mEq by mouth daily.   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   Drug use: No   Sexual activity: Not on file  Other Topics Concern   Not on file  Social History Narrative   Lives in home with his wife.   Social Determinants of Health   Financial Resource Strain: Low Risk  (10/09/2022)   Received from [facility]   Overall Financial Resource Strain (CARDIA)    Difficulty of Paying Living Expenses: Not hard at all  Food Insecurity: No Food Insecurity (10/09/2022)   Received from [facility]   Hunger Vital Sign    Worried About Running Out of Food in the Last Year: Never true    Ran Out of Food in the Last Year: Never true  Transportation Needs: No Transportation Needs (10/09/2022)   Received from [facility] - Transportation    In the past 12 months, has lack of transportation kept you from medical appointments or from getting medications?: No    Lack of Transportation (Non-Medical): No  Physical Activity: Not on file  Stress: Not on file  Social Connections: Not on file  Intimate Partner Violence: Not on file    Family History  Problem Relation Age of Onset   Leukemia Mother    Heart attack Father    Stroke Sister       Intake/Output Summary (Last 24 hours) at 11/20/2022 1047 Last data filed at 11/20/2022 0934 Gross per 24 hour  Intake 942.45 ml  Output 400 ml  Net 542.45 ml    Vitals:   11/20/22 0700 11/20/22 0759 11/20/22 0800 11/20/22 0932  BP: (!) 146/85 (!) 164/78 109/85 (!) 150/92  Pulse: 86  (!) 53   Resp: (!) 21  (!) 22   Temp:  97.7 F (36.5  C)    TempSrc:  Oral    SpO2: 95%  97%   Weight:      Height:        PHYSICAL EXAM General: Pleasant elderly male, well  nourished, in no acute distress.  Sitting upright in bed with multiple family members at bedside HEENT:  Normocephalic and atraumatic. Neck:  No JVD.  Lungs: Normal respiratory effort on room air. Clear bilaterally to auscultation. No wheezes, crackles, rhonchi.  Heart: HRRR . Normal S1 and S2 without gallops or murmurs.  Abdomen: Non-distended appearing with excess adiposity  Msk: Normal strength and tone for age. Extremities: Warm and well perfused. No clubbing, cyanosis. No peripheral edema.  Neuro: Alert and oriented X 3. Psych:  Answers questions appropriately.   Labs: Basic Metabolic Panel: Recent Labs    11/19/22 1952 11/19/22 2147 11/20/22 0426  NA 140  --  142  K 4.0  --  4.1  CL 102  --  106  CO2 28  --  28  GLUCOSE 134*  --  114*  BUN 28*  --  24*  CREATININE 1.11  --  0.98  CALCIUM 9.0  --  8.7*  MG  --  2.0  --    Liver Function Tests: Recent Labs    11/19/22 1952  AST 14*  ALT 13  ALKPHOS 70  BILITOT 0.8  PROT 7.1  ALBUMIN 3.9   No results for input(s): "LIPASE", "AMYLASE" in the last 72 hours. CBC: Recent Labs    11/19/22 1952 11/20/22 0426  WBC 9.2 7.7  NEUTROABS 6.3  --   HGB 13.7 13.3  HCT 42.0 42.2  MCV 93.1 94.4  PLT 201 183   Cardiac Enzymes: Recent Labs    11/19/22 2147 11/20/22 0233 11/20/22 0426  TROPONINIHS 70* 475* 656*   BNP: Recent Labs    11/19/22 1950  BNP 120.5*   D-Dimer: Recent Labs    11/19/22 1952  DDIMER 1.55*   Hemoglobin A1C: No results for input(s): "HGBA1C" in the last 72 hours. Fasting Lipid Panel: No results for input(s): "CHOL", "HDL", "LDLCALC", "TRIG", "CHOLHDL", "LDLDIRECT" in the last 72 hours. Thyroid Function Tests: Recent Labs    11/19/22 2147  TSH 2.819   Anemia Panel: No results for input(s): "VITAMINB12", "FOLATE", "FERRITIN", "TIBC", "IRON", "RETICCTPCT" in the last 72 hours.   Radiology: CT Angio Chest PE W and/or Wo Contrast  Result Date: 11/19/2022 CLINICAL DATA:  Chest  pain, evaluate for PE EXAM: CT ANGIOGRAPHY CHEST WITH CONTRAST TECHNIQUE: Multidetector CT imaging of the chest was performed using the standard protocol during bolus administration of intravenous contrast. Multiplanar CT image reconstructions and MIPs were obtained to evaluate the vascular anatomy. RADIATION DOSE REDUCTION: This exam was performed according to the departmental dose-optimization program which includes automated exposure control, adjustment of the mA and/or kV according to patient size and/or use of iterative reconstruction technique. CONTRAST:  75mL OMNIPAQUE IOHEXOL 350 MG/ML SOLN COMPARISON:  Chest radiograph dated 11/13/2022. FINDINGS: Cardiovascular: Satisfactory opacification of the bilateral pulmonary arteries to the segmental level. Bilateral lower lobe pulmonary arteries are mildly motion degraded. Within that constraint, there is no evidence of pulmonary embolism. Study is not tailored for evaluation of the thoracic aorta. No evidence of thoracic aortic aneurysm. Atherosclerotic calcifications of the arch. Mild cardiomegaly. No pericardial effusion. Postsurgical changes related to prior CABG. Severe three-vessel coronary atherosclerosis. Mediastinum/Nodes: No suspicious mediastinal lymphadenopathy. Visualized thyroid is unremarkable. Lungs/Pleura: Evaluation of the lung parenchyma is constrained by respiratory motion, particularly within  the bilateral lower lobes. Within that constraint, there are no suspicious pulmonary nodules. Mild scarring/atelectasis in the lingula and bilateral lower lobes. Associated eventration of the left hemidiaphragm. No focal consolidation. No pleural effusion or pneumothorax. Upper Abdomen: Is notable for an aortic stent, vascular calcifications, and multiple simple left renal cysts measuring up to 4.5 cm (series 4/image 153). No follow-up is recommended. Musculoskeletal: Degenerative changes of the visualized thoracolumbar spine. Median sternotomy. Review of  the MIP images confirms the above findings. IMPRESSION: No evidence of pulmonary embolism. No acute cardiopulmonary disease. Aortic Atherosclerosis (ICD10-I70.0). Electronically Signed   By: Charline Bills M.D.   On: 11/19/2022 21:52   DG Chest 2 View  Result Date: 11/13/2022 CLINICAL DATA:  svt EXAM: CHEST - 2 VIEW COMPARISON:  Chest x-ray 10/28/2010 FINDINGS: The heart and mediastinal contours are unchanged. Elevated left hemidiaphragm with no definite hiatal hernia. Left base atelectasis. No focal consolidation. No pulmonary edema. No pleural effusion. No pneumothorax. No acute osseous abnormality.  Sternotomy wires are intact. IMPRESSION: No active cardiopulmonary disease. Electronically Signed   By: Tish Frederickson M.D.   On: 11/13/2022 18:56    ECHO 07/28/2019  1. Left ventricular ejection fraction, by estimation, is 35 to 40%. The  left ventricle has moderately decreased function. The left ventricle  demonstrates regional wall motion abnormalities (see scoring  diagram/findings for description). The left  ventricular internal cavity size was mildly dilated. Left ventricular  diastolic parameters were normal.   2. Right ventricular systolic function is normal. The right ventricular  size is normal. There is normal pulmonary artery systolic pressure.   3. Left atrial size was mildly dilated.   4. Right atrial size was mildly dilated.   5. The mitral valve is normal in structure. Mild to moderate mitral valve  regurgitation.   6. The aortic valve is normal in structure. Aortic valve regurgitation is  not visualized.   FINDINGS   Left Ventricle: Left ventricular ejection fraction, by estimation, is 35  to 40%. The left ventricle has moderately decreased function. The left  ventricle demonstrates regional wall motion abnormalities. Severe akinesis  of the left ventricular,  mid-apical anteroseptal wall and anterior wall. The left ventricular  internal cavity size was mildly dilated.  There is no left ventricular  hypertrophy. Left ventricular diastolic parameters were normal.   TELEMETRY reviewed by me (LT) 11/20/2022 : Sinus bradycardia to sinus rhythm with PACs and PVCs rate 60s to 70s earlier a.m., around 1500 converted to AF RVR with rate 150s-170s  EKG reviewed by me: sinus brady 1*av block with PACs  Data reviewed by me (LT) 11/20/2022: Last outpatient cardiology note, ED note, admission H&P last 24h vitals tele labs imaging I/O   Principal Problem:   Chest pain Active Problems:   Paroxysmal atrial fibrillation with RVR (HCC)   Dyslipidemia   GERD without esophagitis   Essential hypertension   Gout    ASSESSMENT AND PLAN:  Jesse Allison is an 57yoM with a PMH of CAD s/p remote CABG (1990s), chronic HFrEF (35-40% with apical anteroseptal and anterior akinesis, 07/2019), COPD, AAA s/p EVAR (2008), HTN who presented to [facility] ED 11/19/2022 with a diaphoresis, chest pain that radiated to his shoulder blades associated with lightheadedness and weakness, different than his prior angina.  He presented similarly to [facility] ED on 8/5 where he was in SVT which terminated with vagal maneuver.  Cardiology is consulted for further assistance.  # Paroxysmal AF RVR  Initially converted from SVT to  sinus bradycardia to sinus rhythm this morning, converted back to AF RVR on telemetry this afternoon. Symptomatic with chest pain that radiates between his shoulder blades, generalized weakness but notably different than his prior angina. -Start amiodarone bolus and continuous infusion, likely change to p.o. dosing after 24 hours -Increase metoprolol tartrate to 25 mg 3 times daily -Give 2 g mag IV -Monitor and replenish electrolytes for a goal K >4, mag >2 -Continue heparin drip for today -Will drop aspirin, continue clopidogrel, and likely add a DOAC by discharge -Further discussion with family this afternoon, patient is only intermittently compliant with his home CPAP,  encouraged follow-up with PCP for possible change in his CPAP mask versus new sleep study on an outpatient basis  # Demand ischemia # CAD s/p remote CABG Troponins elevated and trending 11, 70, 475, 656, 458, EKG nonischemic.  This is most consistent with demand ischemia in the setting of new onset paroxysmal AF RVR and not ACS. -No plan for invasive cardiac diagnostics at this time  # Chronic HF improved EF Echo this admission with improved EF from 35-40% with apical anteroseptal and anterior akinesis up to 45-50% with mild anterior/apical hypokinesis with moderate-severe LV dilation, G1 DD, and trivial MR. -Euvolemic on exam -GDMT of metoprolol and losartan 100 mg daily -will aim to add SGLT2i, MRA as BP and renal function allow   This patient's plan of care was discussed and created with Dr. Juliann Pares and he is in agreement.  Signed: Rebeca Allegra , PA-C 11/20/2022, 10:47 AM [facility]

## 2022-11-20 NOTE — Assessment & Plan Note (Signed)
-   We will continue allopurinol 

## 2022-11-20 NOTE — Assessment & Plan Note (Signed)
-   We will continue statin therapy. 

## 2022-11-20 NOTE — ED Notes (Signed)
Dr. Arville Care informed of Troponin of 475 and hypertensive.

## 2022-11-21 DIAGNOSIS — I4891 Unspecified atrial fibrillation: Secondary | ICD-10-CM | POA: Diagnosis not present

## 2022-11-21 DIAGNOSIS — I5033 Acute on chronic diastolic (congestive) heart failure: Secondary | ICD-10-CM | POA: Diagnosis not present

## 2022-11-21 DIAGNOSIS — I5021 Acute systolic (congestive) heart failure: Secondary | ICD-10-CM | POA: Diagnosis not present

## 2022-11-21 DIAGNOSIS — I25810 Atherosclerosis of coronary artery bypass graft(s) without angina pectoris: Secondary | ICD-10-CM | POA: Diagnosis not present

## 2022-11-21 DIAGNOSIS — I480 Paroxysmal atrial fibrillation: Secondary | ICD-10-CM | POA: Diagnosis not present

## 2022-11-21 DIAGNOSIS — I10 Essential (primary) hypertension: Secondary | ICD-10-CM | POA: Diagnosis not present

## 2022-11-21 DIAGNOSIS — I495 Sick sinus syndrome: Secondary | ICD-10-CM | POA: Diagnosis not present

## 2022-11-21 DIAGNOSIS — I2489 Other forms of acute ischemic heart disease: Secondary | ICD-10-CM | POA: Diagnosis not present

## 2022-11-21 DIAGNOSIS — R079 Chest pain, unspecified: Secondary | ICD-10-CM | POA: Diagnosis not present

## 2022-11-21 DIAGNOSIS — K219 Gastro-esophageal reflux disease without esophagitis: Secondary | ICD-10-CM | POA: Diagnosis not present

## 2022-11-21 LAB — BASIC METABOLIC PANEL
Anion gap: 7 (ref 5–15)
BUN: 21 mg/dL (ref 8–23)
CO2: 27 mmol/L (ref 22–32)
Calcium: 8.6 mg/dL — ABNORMAL LOW (ref 8.9–10.3)
Chloride: 103 mmol/L (ref 98–111)
Creatinine, Ser: 1.01 mg/dL (ref 0.61–1.24)
GFR, Estimated: >60 mL/min (ref >60)
Glucose, Bld: 130 mg/dL — ABNORMAL HIGH (ref 70–99)
Potassium: 3.9 mmol/L (ref 3.5–5.1)
Sodium: 137 mmol/L (ref 135–145)

## 2022-11-21 LAB — MAGNESIUM: Magnesium: 2.1 mg/dL (ref 1.7–2.4)

## 2022-11-21 LAB — GLUCOSE, CAPILLARY: Glucose-Capillary: 138 mg/dL — ABNORMAL HIGH (ref 70–99)

## 2022-11-21 LAB — MRSA NEXT GEN BY PCR, NASAL: MRSA by PCR Next Gen: NOT DETECTED

## 2022-11-21 MED ORDER — FUROSEMIDE 10 MG/ML IJ SOLN
40.0000 mg | Freq: Once | INTRAMUSCULAR | Status: AC
  Administered 2022-11-21: 40 mg via INTRAVENOUS
  Filled 2022-11-21: qty 4

## 2022-11-21 MED ORDER — HYDRALAZINE HCL 20 MG/ML IJ SOLN
20.0000 mg | Freq: Four times a day (QID) | INTRAMUSCULAR | Status: DC | PRN
  Administered 2022-11-21 – 2022-11-22 (×2): 20 mg via INTRAVENOUS
  Filled 2022-11-21 (×2): qty 1

## 2022-11-21 MED ORDER — AMLODIPINE BESYLATE 5 MG PO TABS
5.0000 mg | ORAL_TABLET | Freq: Every day | ORAL | Status: DC
  Administered 2022-11-21 – 2022-11-23 (×3): 5 mg via ORAL
  Filled 2022-11-21 (×3): qty 1

## 2022-11-21 MED ORDER — CHLORHEXIDINE GLUCONATE CLOTH 2 % EX PADS
6.0000 | MEDICATED_PAD | Freq: Every day | CUTANEOUS | Status: DC
  Administered 2022-11-21 – 2022-11-22 (×2): 6 via TOPICAL

## 2022-11-21 NOTE — Progress Notes (Signed)
ANTICOAGULATION CONSULT NOTE  Pharmacy Consult for heparin infusion Indication: ACS/STEMI  Allergies  Allergen Reactions   Niacin Other (See Comments)    Leg cramps   Oxycodone Other (See Comments)    Makes him loopy and out of control.  Does not like the sensation.    Prednisone Other (See Comments)    Severe pain D/T stopping prednisone immediately rather than weaning it.     Patient Measurements: Height: 5\' 11"  (180.3 cm) Weight: 109.7 kg (241 lb 13.5 oz) IBW/kg (Calculated) : 75.3 Heparin Dosing Weight: 99.2 kg  Vital Signs: Temp: 98.7 F (37.1 C) (08/13 0416) Temp Source: Oral (08/13 0400) BP: 149/69 (08/13 0416) Pulse Rate: 40 (08/13 0416)  Labs: Recent Labs    12/02/2022 1952 11/28/2022 2147 11/20/22 0233 11/20/22 0426 11/20/22 0614 11/20/22 0942 11/20/22 1617 11/21/22 0431  HGB 13.7  --   --  13.3  --   --   --  13.0  HCT 42.0  --   --  42.2  --   --   --  39.9  PLT 201  --   --  183  --   --   --  177  HEPARINUNFRC  --   --   --   --  0.54  --  0.37 0.30  CREATININE 1.11  --   --  0.98  --   --   --   --   TROPONINIHS 11   < > 475* 656*  --  458*  --   --    < > = values in this interval not displayed.    Estimated Creatinine Clearance: 72 mL/min (by C-G formula based on SCr of 0.98 mg/dL).   Medical History: Past Medical History:  Diagnosis Date   AAA (abdominal aortic aneurysm) (HCC)    s/p EVAR in 2008   Anemia    iron deficiency   Anxiety    Aortic atherosclerosis (HCC)    Arthritis    Chronic cough    since teeanage years   Complication of anesthesia    woke up during AAA repair. Had an epidural and does NOT want again.   COPD (chronic obstructive pulmonary disease) (HCC)    Coronary artery disease    Diverticulosis    Dyspnea    GERD (gastroesophageal reflux disease)    Gout    Hx of CABG 09/1991   Hx of laryngeal cancer 2015   s/p surgery and XRT   Hypercholesterolemia    Hypertension    Hypothyroidism    Myocardial infarction  (HCC) 1993   Obesity (BMI 30.0-34.9) 08/11/2019   PONV (postoperative nausea and vomiting)    Several times, vomitting for hours after surgery.   PVD (peripheral vascular disease) (HCC)    Sleep apnea    Spinal stenosis    TIA (transient ischemic attack)     Assessment: Pt is a 82 yo male presenting to ED d/t worsening chronic cough, found with elevated BNP and troponin I, trending up.   8/12 0614 HL 0.54  8/12 1617 HL 0.37  8/13 0431 HL 0.30, therapeutic X 3   Goal of Therapy:  Heparin level 0.3-0.7 units/ml Monitor platelets by anticoagulation protocol: Yes   Plan:  8/13:  HL @ 0431 = 0.30, therapeutic X 3  - Will continue pt on current rate and recheck HL on 8/14 with AM labs.   , D 11/21/2022 5:04 AM

## 2022-11-21 NOTE — Assessment & Plan Note (Signed)
-   We will continue he his antihypertensives.

## 2022-11-21 NOTE — Plan of Care (Signed)
  Problem: Education: Goal: Knowledge of General Education information will improve Description: Including pain rating scale, medication(s)/side effects and non-pharmacologic comfort measures Outcome: Progressing   Problem: Health Behavior/Discharge Planning: Goal: Ability to manage health-related needs will improve Outcome: Progressing   Problem: Clinical Measurements: Goal: Ability to maintain clinical measurements within normal limits will improve Outcome: Progressing Goal: Will remain free from infection Outcome: Progressing Goal: Diagnostic test results will improve Outcome: Progressing Goal: Respiratory complications will improve Outcome: Progressing Goal: Cardiovascular complication will be avoided Outcome: Progressing   Problem: Activity: Goal: Risk for activity intolerance will decrease Outcome: Progressing   Problem: Nutrition: Goal: Adequate nutrition will be maintained Outcome: Progressing   Problem: Coping: Goal: Level of anxiety will decrease Outcome: Progressing   Problem: Elimination: Goal: Will not experience complications related to bowel motility Outcome: Progressing Goal: Will not experience complications related to urinary retention Outcome: Progressing   Problem: Pain Managment: Goal: General experience of comfort will improve Outcome: Progressing   Problem: Safety: Goal: Ability to remain free from injury will improve Outcome: Progressing   Problem: Skin Integrity: Goal: Risk for impaired skin integrity will decrease Outcome: Progressing   Problem: Education: Goal: Knowledge of disease or condition will improve Outcome: Progressing Goal: Understanding of medication regimen will improve Outcome: Progressing Goal: Individualized Educational Video(s) Outcome: Progressing   Problem: Activity: Goal: Ability to tolerate increased activity will improve Outcome: Progressing   Problem: Cardiac: Goal: Ability to achieve and maintain  adequate cardiopulmonary perfusion will improve Outcome: Progressing   Problem: Health Behavior/Discharge Planning: Goal: Ability to safely manage health-related needs after discharge will improve Outcome: Progressing   Problem: Education: Goal: Knowledge of cardiac device and self-care will improve Outcome: Progressing Goal: Ability to safely manage health related needs after discharge will improve Outcome: Progressing Goal: Individualized Educational Video(s) Outcome: Progressing   Problem: Cardiac: Goal: Ability to achieve and maintain adequate cardiopulmonary perfusion will improve Outcome: Progressing

## 2022-11-21 NOTE — Progress Notes (Signed)
Progress Note   Patient: Jesse Allison [id number] DOB: October 09, 1940 DOA: 11/19/2022     1 DOS: the patient was seen and examined on 11/21/2022   Brief hospital course: Taken from H&P.  Jesse Allison is a 82 y.o. Caucasian male with medical history significant for coronary artery disease status post three-vessel CABG, COPD, GERD, hypertension, dyslipidemia and hypothyroidism, presented to the emergency room with acute onset of chest pain graded 5/10 in severity and felt as pressure between shoulder blades with associated diaphoresis, jitteriness and weakness.  The patient admitted to nausea without vomiting.   Patient was tachycardic on presentation, EKG shows A-fib with RVR, incomplete right bundle branch block, anteroseptal and inferior Q waves. CTA chest with no PE or acute cardiopulmonary disease.  Patient had recurrent atrial fibrillation overnight.  Received Cardizem bolus followed by Cardizem infusion.  Troponin trending up so he was started on heparin infusion.  Cardiology was consulted.  8/12: Vitals and labs stable, troponin peaked at 656, likely secondary to demand ischemia with going in and out of A-fib with RVR, BNP 120.  Echocardiogram pending. Cardiology started amiodarone infusion.  8/13: Patient with recurrent in and out of A-fib with RVR, becoming significantly bradycardic in between, concern of tachybradycardia syndrome.  He was transferred to stepdown.  Cardiology is planning pacemaker placement tomorrow morning.  Echocardiogram with EF of 45 to 50%, regional wall motion abnormalities, dilated cardiomyopathy and grade 1 diastolic dysfunction.    Assessment and Plan: * Chest pain The patient has a history of coronary artery disease status post three-vessel CABG. troponin peaked at 656, likely secondary to demand ischemia with multiple episodes of A-fib with RVR.  Echocardiogram with low EF and regional wall motion abnormalities Cardiology is on board -Continue with  heparin infusion -Continue with supportive care   Acute HFrEF (heart failure with reduced ejection fraction) (HCC) Echocardiogram with EF of 40 to 45%, moderate to severe dilated cardiomyopathy, regional wall motion abnormalities and grade 1 diastolic dysfunction. -Cardiology is on board  Paroxysmal atrial fibrillation with RVR (HCC) Initially responded to Cardizem infusion.  Going in and out of A-fib with RVR.  Becoming significantly bradycardic in between-concern of tachybradycardia syndrome Patient was transferred to stepdown. Pacemaker placement tomorrow morning Continue with amiodarone infusion Holding metoprolol Currently on heparin infusion  Essential hypertension - We will continue he his antihypertensives.  Dyslipidemia - We will continue statin therapy.  Hypothyroidism TSH normal. -Continue home Synthroid  Gout - We will continue allopurinol.  GERD without esophagitis - We will continue PPI therapy.   Subjective: Patient was seen and examined today.  At the time of exam he was in normal sinus rhythm and no other concern.  Had a difficult night with intermittent palpitations, dizziness, due to extreme variation in heart rate.  Physical Exam: Vitals:   11/21/22 1000 11/21/22 1100 11/21/22 1200 11/21/22 1400  BP: (!) 174/91 (!) 168/79 (!) 179/80 (!) 162/66  Pulse: (!) 54 (!) 51 (!) 58 (!) 41  Resp: 15  (!) 21 17  Temp:   (!) 96.6 F (35.9 C)   TempSrc:   Axillary   SpO2: 93% 97% 95% 93%  Weight:      Height:       General.  Obese gentleman, in no acute distress. Pulmonary.  Lungs clear bilaterally, normal respiratory effort. CV.  Regular rate and rhythm, no JVD, rub or murmur. Abdomen.  Soft, nontender, nondistended, BS positive. CNS.  Alert and oriented .  No focal neurologic deficit. Extremities.  No edema, no cyanosis, pulses intact and symmetrical. Psychiatry.  Judgment and insight appears normal.   Data Reviewed: Prior data reviewed  Family  Communication: Talked with wife on phone.  Disposition: Status is: Inpatient Remains inpatient appropriate because: Severity of illness  Planned Discharge Destination: Home  DVT prophylaxis.  Heparin GTT Time spent: 50  minutes  This record has been created using Conservation officer, historic buildings. Errors have been sought and corrected,but may not always be located. Such creation errors do not reflect on the standard of care.   Author: Arnetha Courser, MD 11/21/2022 2:36 PM  For on call review [email or web address].

## 2022-11-21 NOTE — Progress Notes (Signed)
2004 - 13 run vtach. Pt asymptotic. Pt remains on cardiac monitor.   2020 - Hospitalist notified of ectopy and elevated BP w/ plans to treat w/ PRN Hydralazine. Awaiting response.  2024 - Order received to check Mag and replace if Mag < 2.   2040 - Lab in to draw mag level. Awaiting results.   2200 - Mag 2.1. No need for replacement at this time. Pt remains on cardiac monitor without symptoms. Call bell within reach. Bed in lowest position with bed alarms on. Administered PRN Trazodone per pt's request (see EMAR).

## 2022-11-21 NOTE — Progress Notes (Signed)
[facility] CLINIC CARDIOLOGY CONSULT NOTE       Patient ID: Jesse Allison MRN: [id number] DOB/AGE: 1941-03-01 82 y.o.  Admit date: 11/19/2022 Referring Physician Dr. Valente David Primary Physician Dr. Graciela Husbands Primary Cardiologist Dr. Juliann Pares Reason for Consultation chest pain, elevated troponin, possible AF vs SVT  HPI: [name] is an 58yoM with a PMH of CAD s/p remote CABG (1990s), chronic HFrEF (35-40% with apical anteroseptal and anterior akinesis, 07/2019), COPD, AAA s/p EVAR (2008), HTN who presented to [facility] ED 11/19/2022 with a diaphoresis, chest pain that radiated to his shoulder blades associated with lightheadedness and weakness, different than his prior angina.  He presented similarly to [facility] ED on 8/5 where he was in SVT which terminated with vagal maneuver.  In and out of AF RVR to 160s/170s and sinus brady ~40s this admission.   Interval History:  - seen and examined this AM in stepdown. Had another paroxysm of RVR overnight, symptomatic with feeling hot and flushed with chest discomfort - all terminating after conversion to sinus - hypertensive and hemodynamically stable, some transient lightheadedness with standing but no syncope. Overall feels well, chest pain free when not in AF RVR - in sinus brady with occasional PVCs on tele, rate low-mid 40s at rest and increase to 60s with conversation and activity   Review of systems complete and found to be negative unless listed above     Past Medical History:  Diagnosis Date   AAA (abdominal aortic aneurysm) (HCC)    s/p EVAR in 2008   Anemia    iron deficiency   Anxiety    Aortic atherosclerosis (HCC)    Arthritis    Chronic cough    since teeanage years   Complication of anesthesia    woke up during AAA repair. Had an epidural and does NOT want again.   COPD (chronic obstructive pulmonary disease) (HCC)    Coronary artery disease    Diverticulosis    Dyspnea    GERD (gastroesophageal reflux  disease)    Gout    Hx of CABG 09/1991   Hx of laryngeal cancer 2015   s/p surgery and XRT   Hypercholesterolemia    Hypertension    Hypothyroidism    Myocardial infarction (HCC) 1993   Obesity (BMI 30.0-34.9) 08/11/2019   PONV (postoperative nausea and vomiting)    Several times, vomitting for hours after surgery.   PVD (peripheral vascular disease) (HCC)    Sleep apnea    Spinal stenosis    TIA (transient ischemic attack)     Past Surgical History:  Procedure Laterality Date   ABDOMINAL AORTIC ANEURYSM REPAIR  2010   no longer followed on a regular basis   BACK SURGERY  2000   L4 . piece of vertebrae broke off and lodged into spine. had to remove the loose piece.   CARDIAC CATHETERIZATION     CATARACT EXTRACTION W/PHACO Left 12/12/2016   Procedure: CATARACT EXTRACTION PHACO AND INTRAOCULAR LENS PLACEMENT (IOC);  Surgeon: Galen Manila, MD;  Location: [facility] ORS;  Service: Ophthalmology;  Laterality: Left;  [place] 00:38.1 AP% 14.1 CDE 5.38 FLUID PACK LOT # [id number]   CATARACT EXTRACTION W/PHACO Right 12/26/2016   Procedure: CATARACT EXTRACTION PHACO AND INTRAOCULAR LENS PLACEMENT (IOC);  Surgeon: Galen Manila, MD;  Location: [facility] ORS;  Service: Ophthalmology;  Laterality: Right;  [place] 00:53.7 AP% 12.9 CDE 6.92 Fluid Pack Lot # [id number]   COLONOSCOPY WITH PROPOFOL N/A 06/22/2020   Procedure: COLONOSCOPY WITH PROPOFOL;  Surgeon: Regis Bill, MD;  Location: [facility] ENDOSCOPY;  Service: Endoscopy;  Laterality: N/A;   CORONARY ARTERY BYPASS GRAFT  1993   EYE SURGERY Bilateral    LARYNGOSCOPY  2015   tumor removal followed by radiation treatments.   ROBOTIC ASSISTED LAPAROSCOPIC LYSIS OF ADHESION  07/15/2020   Procedure: XI ROBOTIC ASSISTED LAPAROSCOPIC LYSIS OF ADHESION;  Surgeon: Sung Amabile, DO;  Location: [facility] ORS;  Service: General;;   TESTICLAR CYST EXCISION     TOTAL HIP ARTHROPLASTY Right 08/21/2019   Procedure: TOTAL HIP ARTHROPLASTY ANTERIOR APPROACH;  Surgeon: Kennedy Bucker, MD;  Location: [facility] ORS;  Service: Orthopedics;  Laterality: Right;    Medications Prior to Admission  Medication Sig Dispense Refill Last Dose   allopurinol (ZYLOPRIM) 300 MG tablet Take 1 tablet by mouth daily.   11/19/2022 at 1200   aspirin EC 81 MG tablet Take 1 tablet (81 mg total) by mouth in the morning and at bedtime. (Patient taking differently: Take 81 mg by mouth daily.) 30 tablet 0 11/19/2022 at AM   azelastine (ASTELIN) 0.1 % nasal spray Place 1-2 sprays into both nostrils 2 (two) times daily as needed for allergies.    PRN at PRN   bismuth subsalicylate (PEPTO BISMOL) 262 MG/15ML suspension Take 30 mLs by mouth every 6 (six) hours as needed for indigestion.   PRN at PRN   calcium carbonate (OSCAL) 1500 (600 Ca) MG TABS tablet Take 600 mg of elemental calcium by mouth at bedtime.    11/18/2022 at Unknown   Carboxymethylcellulose Sodium (LUBRICANT EYE DROPS OP) Place 1 drop into both eyes daily as needed (dry eye).   PRN at PRN   carvedilol (COREG) 3.125 MG tablet Take 6.25 mg by mouth 2 (two) times daily.   11/19/2022 at 1700   clopidogrel (PLAVIX) 75 MG tablet Take 75 mg by mouth daily.   11/19/2022 at 1200   cyanocobalamin 1000 MCG tablet Take 1,000 mcg by mouth daily after lunch.    11/19/2022 at 1200   doxazosin (CARDURA) 2 MG tablet Take 2 mg by mouth at bedtime.   11/18/2022 at Unknown   furosemide (LASIX) 40 MG tablet Take 40 mg by mouth daily.   11/19/2022 at Unknown   Iron, Ferrous Sulfate, 142 (45 Fe) MG TBCR Take 45 mg by mouth daily with lunch.    11/19/2022 at 1200   levothyroxine (SYNTHROID) 50 MCG tablet Take 75 mcg by mouth daily.   11/19/2022 at AM   losartan (COZAAR) 100 MG tablet Take 100 mg by mouth at bedtime.   11/19/2022 at 1700   lovastatin (MEVACOR) 20 MG tablet Take 20 mg by mouth at bedtime.   11/19/2022 at 1700   Misc Natural Products (OSTEO BI-FLEX JOINT SHIELD PO) Take 2 tablets by mouth daily.   11/19/2022 at Unknown   nitroGLYCERIN (NITROSTAT) 0.4 MG SL  tablet Place 0.4 mg under the tongue every 5 (five) minutes as needed for chest pain.   11/19/2022 at 1700   pantoprazole (PROTONIX) 40 MG tablet Take 40 mg by mouth daily before supper.   11/19/2022 at 1700   potassium chloride (KLOR-CON) 10 MEQ tablet Take 10 mEq by mouth daily.   11/19/2022 at AM   Social History   Socioeconomic History   Marital status: Married    Spouse name: Andrey Campanile   Number of children: Not on file   Years of education: Not on file   Highest education level: Not on file  Occupational History   Occupation:  management in chemical sales    Comment: retired  Tobacco Use   Smoking status: Former    Current packs/day: 0.00    Types: Cigarettes    Quit date: 2010    Years since quitting: 14.6   Smokeless tobacco: Never  Vaping Use   Vaping status: Never Used  Substance and Sexual Activity   Alcohol use: Yes    Comment: couple of glasses of wine per day.   Drug use: No   Sexual activity: Not on file  Other Topics Concern   Not on file  Social History Narrative   Lives in home with his wife.   Social Determinants of Health   Financial Resource Strain: Low Risk  (10/09/2022)   Received from [facility]   Overall Financial Resource Strain (CARDIA)    Difficulty of Paying Living Expenses: Not hard at all  Food Insecurity: No Food Insecurity (11/20/2022)   Hunger Vital Sign    Worried About Running Out of Food in the Last Year: Never true    Ran Out of Food in the Last Year: Never true  Transportation Needs: No Transportation Needs (11/20/2022)   PRAPARE - Administrator, Civil Service (Medical): No    Lack of Transportation (Non-Medical): No  Physical Activity: Not on file  Stress: Not on file  Social Connections: Not on file  Intimate Partner Violence: Not At Risk (11/20/2022)   Humiliation, Afraid, Rape, and Kick questionnaire    Fear of Current or Ex-Partner: No    Emotionally Abused: No    Physically Abused: No    Sexually  Abused: No    Family History  Problem Relation Age of Onset   Leukemia Mother    Heart attack Father    Stroke Sister       Intake/Output Summary (Last 24 hours) at 11/21/2022 0830 Last data filed at 11/21/2022 0500 Gross per 24 hour  Intake 3232.57 ml  Output 1725 ml  Net 1507.57 ml    Vitals:   11/21/22 0545 11/21/22 0600 11/21/22 0615 11/21/22 0630  BP: (!) 148/62 (!) 154/57 (!) 143/68 (!) 148/63  Pulse: (!) 46 (!) 45 (!) 48 (!) 48  Resp: 16 16 (!) 21 18  Temp:      TempSrc:      SpO2: 98% 96% 95% 98%  Weight:      Height:        PHYSICAL EXAM General: Pleasant elderly male, well nourished, in no acute distress.  Sitting upright in bed with multiple family members at bedside HEENT:  Normocephalic and atraumatic. Neck:  No JVD.  Lungs: Normal respiratory effort on room air. Clear bilaterally to auscultation. No wheezes, crackles, rhonchi.  Heart: HRRR . Normal S1 and S2 without gallops or murmurs.  Abdomen: Non-distended appearing with excess adiposity  Msk: Normal strength and tone for age. Extremities: Warm and well perfused. No clubbing, cyanosis. No peripheral edema.  Neuro: Alert and oriented X 3. Psych:  Answers questions appropriately.   Labs: Basic Metabolic Panel: Recent Labs    11/19/22 1952 11/19/22 2147 11/20/22 0426  NA 140  --  142  K 4.0  --  4.1  CL 102  --  106  CO2 28  --  28  GLUCOSE 134*  --  114*  BUN 28*  --  24*  CREATININE 1.11  --  0.98  CALCIUM 9.0  --  8.7*  MG  --  2.0  --    Liver Function  Tests: Recent Labs    11/19/22 1952  AST 14*  ALT 13  ALKPHOS 70  BILITOT 0.8  PROT 7.1  ALBUMIN 3.9   No results for input(s): "LIPASE", "AMYLASE" in the last 72 hours. CBC: Recent Labs    11/19/22 1952 11/20/22 0426 11/21/22 0431  WBC 9.2 7.7 9.1  NEUTROABS 6.3  --   --   HGB 13.7 13.3 13.0  HCT 42.0 42.2 39.9  MCV 93.1 94.4 91.5  PLT 201 183 177   Cardiac Enzymes: Recent Labs    11/20/22 0233 11/20/22 0426  11/20/22 0942  TROPONINIHS 475* 656* 458*   BNP: Recent Labs    11/19/22 1950  BNP 120.5*   D-Dimer: Recent Labs    11/19/22 1952  DDIMER 1.55*   Hemoglobin A1C: No results for input(s): "HGBA1C" in the last 72 hours. Fasting Lipid Panel: No results for input(s): "CHOL", "HDL", "LDLCALC", "TRIG", "CHOLHDL", "LDLDIRECT" in the last 72 hours. Thyroid Function Tests: Recent Labs    11/19/22 2147  TSH 2.819   Anemia Panel: No results for input(s): "VITAMINB12", "FOLATE", "FERRITIN", "TIBC", "IRON", "RETICCTPCT" in the last 72 hours.   Radiology: ECHOCARDIOGRAM COMPLETE  Result Date: 11/20/2022    ECHOCARDIOGRAM REPORT   Patient Name:   KERT MAKOSKY Date of Exam: 11/20/2022 Medical Rec #:  [id number]        Height:       71.0 in Accession #:    [id number]       Weight:       241.8 lb Date of Birth:  1940-08-24         BSA:          2.286 m Patient Age:    82 years         BP:           146/85 mmHg Patient Gender: M                HR:           86 bpm. Exam Location:  [facility] Procedure: 2D Echo, Cardiac Doppler, Color Doppler and Strain Analysis Indications:     Atrial Fibrillation I48.91  History:         Patient has prior history of Echocardiogram examinations, most                  recent 07/29/2019. COPD; Risk Factors:Hypertension. AAA.  Sonographer:     Cristela Blue Referring Phys:  [id number] JAN A MANSY Diagnosing Phys: Alwyn Pea MD  Sonographer Comments: Suboptimal apical window. Global longitudinal strain was attempted. IMPRESSIONS  1. Mild ant/apical hypo EF=50%.  2. Left ventricular ejection fraction, by estimation, is 45 to 50%. The left ventricle has mildly decreased function. The left ventricle demonstrates regional wall motion abnormalities (see scoring diagram/findings for description). The left ventricular  internal cavity size was moderately to severely dilated. There is moderate concentric left ventricular hypertrophy. Left ventricular diastolic parameters are consistent  with Grade I diastolic dysfunction (impaired relaxation).  3. Right ventricular systolic function is low normal. The right ventricular size is normal. Moderately increased right ventricular wall thickness.  4. The mitral valve is normal in structure. Trivial mitral valve regurgitation.  5. The aortic valve is normal in structure. Aortic valve regurgitation is not visualized. Aortic valve sclerosis is present, with no evidence of aortic valve stenosis. FINDINGS  Left Ventricle: Left ventricular ejection fraction, by estimation, is 45 to 50%. The left ventricle has mildly decreased function. The left  ventricle demonstrates regional wall motion abnormalities. The left ventricular internal cavity size was moderately to severely dilated. There is moderate concentric left ventricular hypertrophy. Left ventricular diastolic parameters are consistent with Grade I diastolic dysfunction (impaired relaxation). Right Ventricle: The right ventricular size is normal. Moderately increased right ventricular wall thickness. Right ventricular systolic function is low normal. Left Atrium: Left atrial size was normal in size. Right Atrium: Right atrial size was normal in size. Pericardium: There is no evidence of pericardial effusion. Mitral Valve: The mitral valve is normal in structure. Trivial mitral valve regurgitation. Tricuspid Valve: The tricuspid valve is normal in structure. Tricuspid valve regurgitation is trivial. Aortic Valve: The aortic valve is normal in structure. Aortic valve regurgitation is not visualized. Aortic valve sclerosis is present, with no evidence of aortic valve stenosis. Aortic valve mean gradient measures 2.0 mmHg. Aortic valve peak gradient measures 3.4 mmHg. Aortic valve area, by VTI measures 2.32 cm. Pulmonic Valve: The pulmonic valve was normal in structure. Pulmonic valve regurgitation is not visualized. Aorta: The ascending aorta was not well visualized. IAS/Shunts: No atrial level shunt detected  by color flow Doppler. Additional Comments: Mild ant/apical hypo EF=50%.  LEFT VENTRICLE PLAX 2D LVIDd:         5.80 cm      Diastology LVIDs:         4.50 cm      LV e' medial:    4.03 cm/s LV PW:         1.40 cm      LV E/e' medial:  15.1 LV IVS:        1.40 cm      LV e' lateral:   12.50 cm/s LVOT diam:     2.10 cm      LV E/e' lateral: 4.9 LV SV:         55 LV SV Index:   24 LVOT Area:     3.46 cm  LV Volumes (MOD) LV vol d, MOD A4C: 157.0 ml LV vol s, MOD A4C: 77.4 ml LV SV MOD A4C:     157.0 ml RIGHT VENTRICLE RV Basal diam:  4.30 cm RV Mid diam:    3.10 cm RV S prime:     8.59 cm/s TAPSE (M-mode): 2.7 cm LEFT ATRIUM           Index        RIGHT ATRIUM           Index LA diam:      3.50 cm 1.53 cm/m   RA Area:     23.30 cm LA Vol (A4C): 67.2 ml 29.40 ml/m  RA Volume:   61.10 ml  26.73 ml/m  AORTIC VALVE AV Area (Vmax):    1.99 cm AV Area (Vmean):   2.01 cm AV Area (VTI):     2.32 cm AV Vmax:           91.60 cm/s AV Vmean:          69.000 cm/s AV VTI:            0.236 m AV Peak Grad:      3.4 mmHg AV Mean Grad:      2.0 mmHg LVOT Vmax:         52.70 cm/s LVOT Vmean:        40.000 cm/s LVOT VTI:          0.158 m LVOT/AV VTI ratio: 0.67  AORTA Ao Root diam: 3.70 cm MITRAL VALVE  TRICUSPID VALVE MV Area (PHT): 3.06 cm    TR Peak grad:   13.5 mmHg MV Decel Time: 248 msec    TR Vmax:        184.00 cm/s MV E velocity: 60.80 cm/s MV A velocity: 96.90 cm/s  SHUNTS MV E/A ratio:  0.63        Systemic VTI:  0.16 m                            Systemic Diam: 2.10 cm Alwyn Pea MD Electronically signed by Alwyn Pea MD Signature Date/Time: 11/20/2022/2:06:31 PM    Final    CT Angio Chest PE W and/or Wo Contrast  Result Date: 11/19/2022 CLINICAL DATA:  Chest pain, evaluate for PE EXAM: CT ANGIOGRAPHY CHEST WITH CONTRAST TECHNIQUE: Multidetector CT imaging of the chest was performed using the standard protocol during bolus administration of intravenous contrast. Multiplanar CT image  reconstructions and MIPs were obtained to evaluate the vascular anatomy. RADIATION DOSE REDUCTION: This exam was performed according to the departmental dose-optimization program which includes automated exposure control, adjustment of the mA and/or kV according to patient size and/or use of iterative reconstruction technique. CONTRAST:  75mL OMNIPAQUE IOHEXOL 350 MG/ML SOLN COMPARISON:  Chest radiograph dated 11/13/2022. FINDINGS: Cardiovascular: Satisfactory opacification of the bilateral pulmonary arteries to the segmental level. Bilateral lower lobe pulmonary arteries are mildly motion degraded. Within that constraint, there is no evidence of pulmonary embolism. Study is not tailored for evaluation of the thoracic aorta. No evidence of thoracic aortic aneurysm. Atherosclerotic calcifications of the arch. Mild cardiomegaly. No pericardial effusion. Postsurgical changes related to prior CABG. Severe three-vessel coronary atherosclerosis. Mediastinum/Nodes: No suspicious mediastinal lymphadenopathy. Visualized thyroid is unremarkable. Lungs/Pleura: Evaluation of the lung parenchyma is constrained by respiratory motion, particularly within the bilateral lower lobes. Within that constraint, there are no suspicious pulmonary nodules. Mild scarring/atelectasis in the lingula and bilateral lower lobes. Associated eventration of the left hemidiaphragm. No focal consolidation. No pleural effusion or pneumothorax. Upper Abdomen: Is notable for an aortic stent, vascular calcifications, and multiple simple left renal cysts measuring up to 4.5 cm (series 4/image 153). No follow-up is recommended. Musculoskeletal: Degenerative changes of the visualized thoracolumbar spine. Median sternotomy. Review of the MIP images confirms the above findings. IMPRESSION: No evidence of pulmonary embolism. No acute cardiopulmonary disease. Aortic Atherosclerosis (ICD10-I70.0). Electronically Signed   By: Charline Bills M.D.   On:  11/19/2022 21:52   DG Chest 2 View  Result Date: 11/13/2022 CLINICAL DATA:  svt EXAM: CHEST - 2 VIEW COMPARISON:  Chest x-ray 10/28/2010 FINDINGS: The heart and mediastinal contours are unchanged. Elevated left hemidiaphragm with no definite hiatal hernia. Left base atelectasis. No focal consolidation. No pulmonary edema. No pleural effusion. No pneumothorax. No acute osseous abnormality.  Sternotomy wires are intact. IMPRESSION: No active cardiopulmonary disease. Electronically Signed   By: Tish Frederickson M.D.   On: 11/13/2022 18:56    ECHO 07/28/2019  1. Left ventricular ejection fraction, by estimation, is 35 to 40%. The  left ventricle has moderately decreased function. The left ventricle  demonstrates regional wall motion abnormalities (see scoring  diagram/findings for description). The left  ventricular internal cavity size was mildly dilated. Left ventricular  diastolic parameters were normal.   2. Right ventricular systolic function is normal. The right ventricular  size is normal. There is normal pulmonary artery systolic pressure.   3. Left atrial size was mildly dilated.   4.  Right atrial size was mildly dilated.   5. The mitral valve is normal in structure. Mild to moderate mitral valve  regurgitation.   6. The aortic valve is normal in structure. Aortic valve regurgitation is  not visualized.   FINDINGS   Left Ventricle: Left ventricular ejection fraction, by estimation, is 35  to 40%. The left ventricle has moderately decreased function. The left  ventricle demonstrates regional wall motion abnormalities. Severe akinesis  of the left ventricular,  mid-apical anteroseptal wall and anterior wall. The left ventricular  internal cavity size was mildly dilated. There is no left ventricular  hypertrophy. Left ventricular diastolic parameters were normal.   TELEMETRY reviewed by me (LT) 11/21/2022 : Sinus bradycardia to sinus rhythm with PACs and PVCs rate 60s to 70s earlier  a.m., around 1500 converted to AF RVR with rate 150s-170s  EKG reviewed by me: sinus brady 1*av block with PACs  Data reviewed by me (LT) 11/21/2022: Last outpatient cardiology note, ED note, admission H&P last 24h vitals tele labs imaging I/O   Principal Problem:   Chest pain Active Problems:   Hypothyroidism   Paroxysmal atrial fibrillation with RVR (HCC)   Dyslipidemia   GERD without esophagitis   Essential hypertension   Gout    ASSESSMENT AND PLAN:  Brayan "Virl Diamond" Bernaldo Pentico is an 1yoM with a PMH of CAD s/p remote CABG (1990s), chronic HFrEF (35-40% with apical anteroseptal and anterior akinesis, 07/2019), COPD, AAA s/p EVAR (2008), HTN who presented to [facility] ED 11/19/2022 with a diaphoresis, chest pain that radiated to his shoulder blades associated with lightheadedness and weakness, different than his prior angina.  He presented similarly to [facility] ED on 8/5 where he was in SVT which terminated with vagal maneuver.  In and out of AF RVR to 160s/170s and sinus brady ~40s this admission.   # Paroxysmal AF RVR  # tachy-brady syndrome Symptomatic with waves of heat and chest pain that radiates between his shoulder blades, generalized weakness but notably different than his prior angina. In and out of AF RVR to the 170s and sinus brady in the low-mid 40s. Uptitration of rate and rhythm control limited by baseline bradycardia  -continue low dose amiodarone infusion, likely change to PO dosing tomorrow -continue metoprolol tartrate to 25 mg 3 times daily with hold parameters for hypotension/bradycardia -Monitor and replenish electrolytes for a goal K >4, mag >2 -Continue heparin drip for today -Will drop aspirin, continue clopidogrel, and likely add a DOAC by discharge -discussed the indication, risks, and benefits of proceeding with dual chamber PPM with the patient. He is agreeable to proceed, written consent with be obtained. NPO at midnight except for sips with meds. Plan for  [employer] dual chamber PPM with Dr. Marcina Millard at 7:30am on 8/14  # Demand ischemia # CAD s/p remote CABG Troponins elevated and trending 11, 70, 475, 656, 458, EKG nonischemic.  This is most consistent with demand ischemia in the setting of new onset paroxysmal AF RVR and not ACS. -No plan for invasive cardiac diagnostics at this time  # Chronic HF improved EF Echo this admission with improved EF from 35-40% with apical anteroseptal and anterior akinesis up to 45-50% with mild anterior/apical hypokinesis with moderate-severe LV dilation, G1 DD, and trivial MR. -Euvolemic on exam -GDMT of metoprolol and losartan 100 mg daily -will aim to add SGLT2i, MRA as BP and renal function allow  This patient's plan of care was discussed and created with Dr. Juliann Pares and he is  in agreement.  Signed: Rebeca Allegra , PA-C 11/21/2022, 8:30 AM [facility]

## 2022-11-21 NOTE — Progress Notes (Signed)
   11/21/22 1800  Spiritual Encounters  Type of Visit Follow up  Care provided to: Pt and family  OnCall Visit Yes  Interventions  Spiritual Care Interventions Made Prayer   Chaplain recognized family from previous visit and entered room to provide compassionate conversation and prayer for upcoming procedure.

## 2022-11-21 NOTE — Assessment & Plan Note (Signed)
Initially responded to Cardizem infusion.  Going in and out of A-fib with RVR.  Becoming significantly bradycardic in between-concern of tachybradycardia syndrome Patient was transferred to stepdown. Pacemaker placement tomorrow morning Continue with amiodarone infusion Holding metoprolol Currently on heparin infusion

## 2022-11-21 NOTE — Assessment & Plan Note (Signed)
The patient has a history of coronary artery disease status post three-vessel CABG. troponin peaked at 656, likely secondary to demand ischemia with multiple episodes of A-fib with RVR.  Echocardiogram with low EF and regional wall motion abnormalities Cardiology is on board -Continue with heparin infusion -Continue with supportive care

## 2022-11-21 NOTE — Assessment & Plan Note (Signed)
Echocardiogram with EF of 40 to 45%, moderate to severe dilated cardiomyopathy, regional wall motion abnormalities and grade 1 diastolic dysfunction. -Cardiology is on board

## 2022-11-22 ENCOUNTER — Encounter: Admission: EM | Disposition: E | Payer: Self-pay | Source: Home / Self Care | Attending: Internal Medicine

## 2022-11-22 ENCOUNTER — Inpatient Hospital Stay: Payer: PPO

## 2022-11-22 DIAGNOSIS — I480 Paroxysmal atrial fibrillation: Secondary | ICD-10-CM | POA: Diagnosis not present

## 2022-11-22 DIAGNOSIS — R9389 Abnormal findings on diagnostic imaging of other specified body structures: Secondary | ICD-10-CM | POA: Diagnosis not present

## 2022-11-22 DIAGNOSIS — R0989 Other specified symptoms and signs involving the circulatory and respiratory systems: Secondary | ICD-10-CM | POA: Diagnosis not present

## 2022-11-22 DIAGNOSIS — M7989 Other specified soft tissue disorders: Secondary | ICD-10-CM | POA: Diagnosis not present

## 2022-11-22 DIAGNOSIS — M79605 Pain in left leg: Secondary | ICD-10-CM | POA: Diagnosis not present

## 2022-11-22 DIAGNOSIS — Z950 Presence of cardiac pacemaker: Secondary | ICD-10-CM | POA: Diagnosis not present

## 2022-11-22 DIAGNOSIS — I495 Sick sinus syndrome: Secondary | ICD-10-CM | POA: Diagnosis not present

## 2022-11-22 DIAGNOSIS — I5033 Acute on chronic diastolic (congestive) heart failure: Secondary | ICD-10-CM | POA: Diagnosis not present

## 2022-11-22 DIAGNOSIS — R079 Chest pain, unspecified: Secondary | ICD-10-CM | POA: Diagnosis not present

## 2022-11-22 DIAGNOSIS — I4891 Unspecified atrial fibrillation: Secondary | ICD-10-CM | POA: Diagnosis not present

## 2022-11-22 DIAGNOSIS — I2489 Other forms of acute ischemic heart disease: Secondary | ICD-10-CM | POA: Diagnosis not present

## 2022-11-22 DIAGNOSIS — I25810 Atherosclerosis of coronary artery bypass graft(s) without angina pectoris: Secondary | ICD-10-CM | POA: Diagnosis not present

## 2022-11-22 DIAGNOSIS — J9811 Atelectasis: Secondary | ICD-10-CM | POA: Diagnosis not present

## 2022-11-22 HISTORY — PX: PACEMAKER IMPLANT: EP1218

## 2022-11-22 LAB — SURGICAL PCR SCREEN
MRSA, PCR: NEGATIVE
Staphylococcus aureus: POSITIVE — AB

## 2022-11-22 SURGERY — PACEMAKER IMPLANT
Anesthesia: Moderate Sedation

## 2022-11-22 SURGERY — LEFT HEART CATH AND CORONARY ANGIOGRAPHY
Anesthesia: Moderate Sedation

## 2022-11-22 MED ORDER — FENTANYL CITRATE (PF) 100 MCG/2ML IJ SOLN
INTRAMUSCULAR | Status: AC
  Filled 2022-11-22: qty 2

## 2022-11-22 MED ORDER — CEPHALEXIN 500 MG PO CAPS
500.0000 mg | ORAL_CAPSULE | Freq: Two times a day (BID) | ORAL | Status: DC
  Administered 2022-11-23 (×2): 500 mg via ORAL
  Filled 2022-11-22 (×3): qty 1

## 2022-11-22 MED ORDER — CEFAZOLIN SODIUM-DEXTROSE 2-4 GM/100ML-% IV SOLN
2.0000 g | INTRAVENOUS | Status: AC
  Administered 2022-11-22: 2 g via INTRAVENOUS
  Filled 2022-11-22: qty 100

## 2022-11-22 MED ORDER — MIDAZOLAM HCL 2 MG/2ML IJ SOLN
INTRAMUSCULAR | Status: DC | PRN
  Administered 2022-11-22: .5 mg via INTRAVENOUS
  Administered 2022-11-22: 1 mg via INTRAVENOUS

## 2022-11-22 MED ORDER — HYDROCODONE-ACETAMINOPHEN 5-325 MG PO TABS
1.0000 | ORAL_TABLET | Freq: Four times a day (QID) | ORAL | Status: AC | PRN
  Administered 2022-11-22 – 2022-11-23 (×4): 1 via ORAL
  Filled 2022-11-22 (×4): qty 1

## 2022-11-22 MED ORDER — MORPHINE SULFATE (PF) 4 MG/ML IV SOLN
4.0000 mg | INTRAVENOUS | Status: DC | PRN
  Administered 2022-11-22 – 2022-11-23 (×8): 4 mg via INTRAVENOUS
  Filled 2022-11-22 (×8): qty 1

## 2022-11-22 MED ORDER — AMIODARONE HCL 200 MG PO TABS: 200.0000 mg | ORAL_TABLET | Freq: Every day | ORAL | Status: DC

## 2022-11-22 MED ORDER — IOHEXOL 300 MG/ML  SOLN
INTRAMUSCULAR | Status: DC | PRN
  Administered 2022-11-22: 15 mL

## 2022-11-22 MED ORDER — ACETAMINOPHEN 325 MG PO TABS
325.0000 mg | ORAL_TABLET | ORAL | Status: DC | PRN
  Administered 2022-11-23: 650 mg via ORAL

## 2022-11-22 MED ORDER — SODIUM CHLORIDE 0.9 % IV SOLN
80.0000 mg | INTRAVENOUS | Status: AC
  Administered 2022-11-22: 80 mg
  Filled 2022-11-22: qty 2

## 2022-11-22 MED ORDER — MORPHINE SULFATE (PF) 2 MG/ML IV SOLN
INTRAVENOUS | Status: AC
  Filled 2022-11-22: qty 1

## 2022-11-22 MED ORDER — MIDAZOLAM HCL 2 MG/2ML IJ SOLN
INTRAMUSCULAR | Status: AC
  Filled 2022-11-22: qty 2

## 2022-11-22 MED ORDER — ATORVASTATIN CALCIUM 20 MG PO TABS
40.0000 mg | ORAL_TABLET | Freq: Every day | ORAL | Status: DC
  Administered 2022-11-23: 40 mg via ORAL
  Filled 2022-11-22 (×2): qty 2

## 2022-11-22 MED ORDER — HEPARIN (PORCINE) IN NACL 1000-0.9 UT/500ML-% IV SOLN
INTRAVENOUS | Status: DC | PRN
  Administered 2022-11-22: 500 mL

## 2022-11-22 MED ORDER — AMIODARONE HCL 200 MG PO TABS
200.0000 mg | ORAL_TABLET | Freq: Two times a day (BID) | ORAL | Status: DC
  Administered 2022-11-22 – 2022-11-23 (×4): 200 mg via ORAL
  Filled 2022-11-22 (×4): qty 1

## 2022-11-22 MED ORDER — LIDOCAINE HCL (PF) 1 % IJ SOLN
INTRAMUSCULAR | Status: DC | PRN
  Administered 2022-11-22: 40 mL

## 2022-11-22 MED ORDER — LIDOCAINE HCL 1 % IJ SOLN
INTRAMUSCULAR | Status: AC
  Filled 2022-11-22: qty 20

## 2022-11-22 MED ORDER — APIXABAN 5 MG PO TABS
5.0000 mg | ORAL_TABLET | Freq: Two times a day (BID) | ORAL | Status: DC
  Administered 2022-11-22 – 2022-11-23 (×3): 5 mg via ORAL
  Filled 2022-11-22 (×3): qty 1

## 2022-11-22 MED ORDER — ONDANSETRON HCL 4 MG/2ML IJ SOLN
4.0000 mg | Freq: Four times a day (QID) | INTRAMUSCULAR | Status: DC | PRN
  Administered 2022-11-22 – 2022-11-23 (×3): 4 mg via INTRAVENOUS
  Filled 2022-11-22: qty 2

## 2022-11-22 MED ORDER — HEPARIN (PORCINE) IN NACL 1000-0.9 UT/500ML-% IV SOLN
INTRAVENOUS | Status: AC
  Filled 2022-11-22: qty 1000

## 2022-11-22 MED ORDER — FUROSEMIDE 40 MG PO TABS
40.0000 mg | ORAL_TABLET | Freq: Every day | ORAL | Status: DC
  Administered 2022-11-22 – 2022-11-23 (×2): 40 mg via ORAL
  Filled 2022-11-22: qty 2
  Filled 2022-11-22: qty 1

## 2022-11-22 MED ORDER — CHLORHEXIDINE GLUCONATE CLOTH 2 % EX PADS
6.0000 | MEDICATED_PAD | Freq: Every day | CUTANEOUS | Status: DC
  Administered 2022-11-23: 6 via TOPICAL

## 2022-11-22 MED ORDER — CEFAZOLIN SODIUM-DEXTROSE 1-4 GM/50ML-% IV SOLN
1.0000 g | Freq: Four times a day (QID) | INTRAVENOUS | Status: AC
  Administered 2022-11-22 – 2022-11-23 (×3): 1 g via INTRAVENOUS
  Filled 2022-11-22 (×3): qty 50

## 2022-11-22 MED ORDER — FENTANYL CITRATE (PF) 100 MCG/2ML IJ SOLN
INTRAMUSCULAR | Status: DC | PRN
  Administered 2022-11-22: 50 ug via INTRAVENOUS
  Administered 2022-11-22: 25 ug via INTRAVENOUS

## 2022-11-22 MED ORDER — METOPROLOL TARTRATE 50 MG PO TABS
50.0000 mg | ORAL_TABLET | Freq: Two times a day (BID) | ORAL | Status: DC
  Administered 2022-11-22 – 2022-11-23 (×3): 50 mg via ORAL
  Filled 2022-11-22 (×4): qty 1

## 2022-11-22 MED ORDER — POTASSIUM CHLORIDE CRYS ER 20 MEQ PO TBCR
40.0000 meq | EXTENDED_RELEASE_TABLET | Freq: Once | ORAL | Status: AC
  Administered 2022-11-22: 40 meq via ORAL
  Filled 2022-11-22: qty 2

## 2022-11-22 MED ORDER — MUPIROCIN 2 % EX OINT
1.0000 | TOPICAL_OINTMENT | Freq: Two times a day (BID) | CUTANEOUS | Status: DC
  Administered 2022-11-22 – 2022-11-23 (×4): 1 via NASAL
  Filled 2022-11-22: qty 22

## 2022-11-22 MED ORDER — MUSCLE RUB 10-15 % EX CREA: TOPICAL_CREAM | CUTANEOUS | Status: DC | PRN

## 2022-11-22 MED ORDER — CEFAZOLIN SODIUM-DEXTROSE 2-4 GM/100ML-% IV SOLN
INTRAVENOUS | Status: AC
  Filled 2022-11-22: qty 100

## 2022-11-22 MED ORDER — SODIUM CHLORIDE 0.9 % IV SOLN: INTRAVENOUS | Status: DC

## 2022-11-22 SURGICAL SUPPLY — 17 items
CABLE SURG 12 DISP A/V CHANNEL (MISCELLANEOUS) IMPLANT
DEVICE DSSCT PLSMBLD 3.0S LGHT (MISCELLANEOUS) IMPLANT
DRAPE INCISE 23X17 STRL (DRAPES) IMPLANT
DRAPE INCISE IOBAN 23X17 STRL (DRAPES) ×1 IMPLANT
IPG PACE AZUR XT DR MRI W1DR01 (Pacemaker) IMPLANT
LEAD CAPSURE NOVUS 5076-52CM (Lead) IMPLANT
LEAD CAPSURE NOVUS 5076-58CM (Lead) IMPLANT
PACE AZURE XT DR MRI W1DR01 (Pacemaker) ×1 IMPLANT
PAD ELECT DEFIB RADIOL ZOLL (MISCELLANEOUS) IMPLANT
PLASMABLADE 3.0S W/LIGHT (MISCELLANEOUS) ×1
SHEATH 7FR PRELUDE SNAP 13 (SHEATH) IMPLANT
SHEATH 9FR PRELUDE SNAP 13 (SHEATH) IMPLANT
SLING ARM IMMOBILIZER LRG (SOFTGOODS) IMPLANT
SUT SILK 0 FSL (SUTURE) IMPLANT
SUT VIC AB 2-0 CT2 27 (SUTURE) IMPLANT
SUT VIC AB 4-0 PS2 18 (SUTURE) IMPLANT
TRAY PACEMAKER INSERTION (PACKS) ×2 IMPLANT

## 2022-11-22 NOTE — Progress Notes (Signed)
ANTICOAGULATION CONSULT NOTE  Pharmacy Consult for heparin infusion Indication: ACS/STEMI  Allergies  Allergen Reactions   Niacin Other (See Comments)    Leg cramps   Oxycodone Other (See Comments)    Makes him loopy and out of control.  Does not like the sensation.    Prednisone Other (See Comments)    Severe pain D/T stopping prednisone immediately rather than weaning it.     Patient Measurements: Height: 5\' 11"  (180.3 cm) Weight: 109 kg (240 lb 4.8 oz) IBW/kg (Calculated) : 75.3 Heparin Dosing Weight: 99.2 kg  Vital Signs: Temp: 97.8 F (36.6 C) (08/14 0315) Temp Source: Oral (08/14 0315) BP: 152/72 (08/14 0500) Pulse Rate: 63 (08/14 0500)  Labs: Recent Labs    11/20/22 0233 11/20/22 0426 11/20/22 4132 11/20/22 0942 11/20/22 1617 11/21/22 0431 11/21/22 0820 12/04/2022 0459  HGB  --  13.3  --   --   --  13.0  --  12.6*  HCT  --  42.2  --   --   --  39.9  --  38.2*  PLT  --  183  --   --   --  177  --  157  HEPARINUNFRC  --   --    < >  --  0.37 0.30  --  0.47  CREATININE  --  0.98  --   --   --   --  1.01 0.99  TROPONINIHS 475* 656*  --  458*  --   --   --   --    < > = values in this interval not displayed.    Estimated Creatinine Clearance: 71 mL/min (by C-G formula based on SCr of 0.99 mg/dL).   Medical History: Past Medical History:  Diagnosis Date   AAA (abdominal aortic aneurysm) (HCC)    s/p EVAR in 2008   Anemia    iron deficiency   Anxiety    Aortic atherosclerosis (HCC)    Arthritis    Chronic cough    since teeanage years   Complication of anesthesia    woke up during AAA repair. Had an epidural and does NOT want again.   COPD (chronic obstructive pulmonary disease) (HCC)    Coronary artery disease    Diverticulosis    Dyspnea    GERD (gastroesophageal reflux disease)    Gout    Hx of CABG 09/1991   Hx of laryngeal cancer 2015   s/p surgery and XRT   Hypercholesterolemia    Hypertension    Hypothyroidism    Myocardial  infarction (HCC) 1993   Obesity (BMI 30.0-34.9) 08/11/2019   PONV (postoperative nausea and vomiting)    Several times, vomitting for hours after surgery.   PVD (peripheral vascular disease) (HCC)    Sleep apnea    Spinal stenosis    TIA (transient ischemic attack)     Assessment: Pt is a 82 yo male presenting to ED d/t worsening chronic cough, found with elevated BNP and troponin I, trending up.   8/12 0614 HL 0.54  8/12 1617 HL 0.37  8/13 0431 HL 0.30, therapeutic X 3   8/14 0459 HL 0.47, therapeutic X 4   Goal of Therapy:  Heparin level 0.3-0.7 units/ml Monitor platelets by anticoagulation protocol: Yes   Plan:  8/14:  HL @ 0459 = 0.47, therapeutic X 4 - Will continue pt on current rate and recheck HL on 8/15 with AM labs.   , D 11/25/2022 5:41 AM

## 2022-11-22 NOTE — Progress Notes (Signed)
tartrate  50 mg Oral BID   mupirocin ointment  1 Application Nasal BID   pantoprazole  40 mg Oral QAC supper   Continuous Infusions:  sodium chloride Stopped (11/20/22 1901)    ceFAZolin (ANCEF) IV 1 g (12/05/2022 1508)     Anti-infectives (From admission, onward)    Start     Dose/Rate Route Frequency Ordered Stop   11/23/22 1000  cephALEXin (KEFLEX) capsule 500 mg        500 mg Oral Every 12 hours 11/18/2022 1026 12/03/22 0959   12/02/2022 1430  ceFAZolin (ANCEF) IVPB 1 g/50 mL premix        1 g 100 mL/hr over 30 Minutes Intravenous Every 6 hours 11/20/2022 0953 11/23/22 0829   11/11/2022 0630  gentamicin (GARAMYCIN) 80 mg in sodium  chloride 0.9 % 500 mL irrigation        80 mg Irrigation On call 11/29/2022 0538 11/15/2022 0913   11/26/2022 0630  ceFAZolin (ANCEF) IVPB 2g/100 mL premix        2 g 200 mL/hr over 30 Minutes Intravenous On call 11/28/2022 0538 12/01/2022 1116              Family Communication/Anticipated D/C date and plan/Code Status   DVT prophylaxis:  apixaban (ELIQUIS) tablet 5 mg     Code Status: Full Code  Family Communication: Plan discussed with 2 daughters at the bedside Disposition Plan: Plan to discharge home on oral   Status is: Inpatient Remains inpatient appropriate because: S/p permanent pacemaker placement       Subjective:   Interval events noted.  He complains of severe pain in the right thigh which is new.  He also reports chronic low back pain and chronic neck pain.  No shortness of breath or chest pain.  His 2 daughters were at the bedside  Objective:    Vitals:   11/10/2022 1245 11/13/2022 1300 11/18/2022 1315 12/08/2022 1330  BP: (!) 155/63 (!) 154/67    Pulse: 98 74 74   Resp: (!) 25 (!) 23 18 17   Temp:      TempSrc:      SpO2: (!) 89% 93% 94%   Weight:      Height:       No data found.   Intake/Output Summary (Last 24 hours) at 11/09/2022 1514 Last data filed at 11/14/2022 0600 Gross per 24 hour  Intake 623.4 ml  Output 450 ml  Net 173.4 ml   Filed Weights   11/09/2022 1932 11/20/22 0650 11/21/22 0500  Weight: 111.1 kg 109.7 kg 109 kg    Exam:  GEN: NAD SKIN: Warm and dry. EYES: EOMI ENT: MMM CV: RRR.  Pacemaker left upper chest.  Dressing at pacemaker site is clean, dry and intact. PULM: CTA B ABD: soft, obese, NT, +BS CNS: AAO x 3, non focal EXT: No edema or tenderness        Data Reviewed:   I have personally reviewed following labs and imaging studies:  Labs: Labs show the following:   Basic Metabolic Panel: Recent Labs  Lab 11/28/2022 1952 12/04/2022 2147 11/20/22 0426 11/21/22 0820 11/21/22 2037 11/16/2022 0459  NA 140  --  142  137  --  138  K 4.0  --  4.1 3.9  --  3.5  CL 102  --  106 103  --  104  CO2 28  --  28 27  --  28  GLUCOSE 134*  --  114* 130*  --  122*  BUN 28*  --  24* 21  --  19  CREATININE 1.11  --  0.98 1.01  --  0.99  CALCIUM 9.0  --  8.7* 8.6*  --  8.5*  MG  --  2.0  --   --  2.1 2.2   GFR Estimated Creatinine Clearance: 71 mL/min (by C-G formula based on SCr of 0.99 mg/dL). Liver Function Tests: Recent Labs  Lab 12/04/2022 1952  AST 14*  ALT 13  ALKPHOS 70  BILITOT 0.8  PROT 7.1  ALBUMIN 3.9   No results for input(s): "LIPASE", "AMYLASE" in the last 168 hours. No results for input(s): "AMMONIA" in the last 168 hours. Coagulation profile No results for input(s): "INR", "PROTIME" in the last 168 hours.  CBC: Recent Labs  Lab 11/13/2022 1952 11/20/22 0426 11/21/22 0431 12/07/2022 0459  WBC 9.2 7.7 9.1 8.1  NEUTROABS 6.3  --   --   --   HGB 13.7 13.3 13.0 12.6*  HCT 42.0 42.2 39.9 38.2*  MCV 93.1 94.4 91.5 90.1  PLT 201 183 177 157   Cardiac Enzymes: No results for input(s): "CKTOTAL", "CKMB", "CKMBINDEX", "TROPONINI" in the last 168 hours. BNP (last 3 results) No results for input(s): "PROBNP" in the last 8760 hours. CBG: Recent Labs  Lab 11/21/22 0450  GLUCAP 138*   D-Dimer: Recent Labs    11/15/2022 1952  DDIMER 1.55*   Hgb A1c: No results for input(s): "HGBA1C" in the last 72 hours. Lipid Profile: No results for input(s): "CHOL", "HDL", "LDLCALC", "TRIG", "CHOLHDL", "LDLDIRECT" in the last 72 hours. Thyroid function studies: Recent Labs    12/08/2022 08-Dec-2145  TSH 2.819   Anemia work up: No results for input(s): "VITAMINB12", "FOLATE", "FERRITIN", "TIBC", "IRON", "RETICCTPCT" in the last 72 hours. Sepsis Labs: Recent Labs  Lab 12/06/2022 1952 11/20/22 0426 11/21/22 0431 12/01/2022 0459  WBC 9.2 7.7 9.1 8.1    Microbiology Recent Results (from the past 240 hour(s))  MRSA Next Gen by PCR, Nasal     Status: None   Collection Time: 11/21/22  5:00 AM   Specimen:  Nasal Mucosa; Nasal Swab  Result Value Ref Range Status   MRSA by PCR Next Gen NOT DETECTED NOT DETECTED Final    Comment: (NOTE) The GeneXpert MRSA Assay (FDA approved for NASAL specimens only), is one component of a comprehensive MRSA colonization surveillance program. It is not intended to diagnose MRSA infection nor to guide or monitor treatment for MRSA infections. Test performance is not FDA approved in patients less than 70 years old. Performed at [facility], [address] [place], [place] [address]   Surgical PCR screen     Status: Abnormal   Collection Time: 11/21/2022  5:47 AM   Specimen: Nasal Mucosa; Nasal Swab  Result Value Ref Range Status   MRSA, PCR NEGATIVE NEGATIVE Final   Staphylococcus aureus POSITIVE (A) NEGATIVE Final    Comment: (NOTE) The Xpert SA Assay (FDA approved for NASAL specimens in patients 19 years of age and older), is one component of a comprehensive surveillance program. It is not intended to diagnose infection nor to guide or monitor treatment. Performed at [facility], [address] [place], [place] [address]     Procedures and diagnostic studies:  DG Chest [facility] 1 View  Result Date: 12/08/2022 CLINICAL DATA:  [id number] Pacemaker [id number] EXAM: PORTABLE CHEST 1 VIEW COMPARISON:  11/13/2022 FINDINGS: Interval placement of left subclavian approach implanted cardiac device with leads projecting over the expected locations of  BUN 28*  --  24* 21  --  19  CREATININE 1.11  --  0.98 1.01  --  0.99  CALCIUM 9.0  --  8.7* 8.6*  --  8.5*  MG  --  2.0  --   --  2.1 2.2   GFR Estimated Creatinine Clearance: 71 mL/min (by C-G formula based on SCr of 0.99 mg/dL). Liver Function Tests: Recent Labs  Lab 11/30/2022 1952  AST 14*  ALT 13  ALKPHOS 70  BILITOT 0.8  PROT 7.1  ALBUMIN 3.9   No results for input(s): "LIPASE", "AMYLASE" in the last 168 hours. No results for input(s): "AMMONIA" in the last 168 hours. Coagulation profile No results for input(s): "INR", "PROTIME" in the last 168 hours.  CBC: Recent Labs  Lab 11/23/2022 1952 11/20/22 0426 11/21/22 0431 11/29/2022 0459  WBC 9.2 7.7 9.1 8.1  NEUTROABS 6.3  --   --   --   HGB 13.7 13.3 13.0 12.6*  HCT 42.0 42.2 39.9 38.2*  MCV 93.1 94.4 91.5 90.1  PLT 201 183 177 157   Cardiac Enzymes: No results for input(s): "CKTOTAL", "CKMB", "CKMBINDEX", "TROPONINI" in the last 168 hours. BNP (last 3 results) No results for input(s): "PROBNP" in the last 8760 hours. CBG: Recent Labs  Lab 11/21/22 0450  GLUCAP 138*   D-Dimer: Recent Labs    11/27/2022 1952  DDIMER 1.55*   Hgb A1c: No results for input(s): "HGBA1C" in the last 72 hours. Lipid Profile: No results for input(s): "CHOL", "HDL", "LDLCALC", "TRIG", "CHOLHDL", "LDLDIRECT" in the last 72 hours. Thyroid function studies: Recent Labs    12/04/2022 08-Dec-2145  TSH 2.819   Anemia work up: No results for input(s): "VITAMINB12", "FOLATE", "FERRITIN", "TIBC", "IRON", "RETICCTPCT" in the last 72 hours. Sepsis Labs: Recent Labs  Lab 12/09/2022 1952 11/20/22 0426 11/21/22 0431 11/09/2022 0459  WBC 9.2 7.7 9.1 8.1    Microbiology Recent Results (from the past 240 hour(s))  MRSA Next Gen by PCR, Nasal     Status: None   Collection Time: 11/21/22  5:00 AM   Specimen:  Nasal Mucosa; Nasal Swab  Result Value Ref Range Status   MRSA by PCR Next Gen NOT DETECTED NOT DETECTED Final    Comment: (NOTE) The GeneXpert MRSA Assay (FDA approved for NASAL specimens only), is one component of a comprehensive MRSA colonization surveillance program. It is not intended to diagnose MRSA infection nor to guide or monitor treatment for MRSA infections. Test performance is not FDA approved in patients less than 70 years old. Performed at [facility], [address] [place], [place] [address]   Surgical PCR screen     Status: Abnormal   Collection Time: 11/27/2022  5:47 AM   Specimen: Nasal Mucosa; Nasal Swab  Result Value Ref Range Status   MRSA, PCR NEGATIVE NEGATIVE Final   Staphylococcus aureus POSITIVE (A) NEGATIVE Final    Comment: (NOTE) The Xpert SA Assay (FDA approved for NASAL specimens in patients 19 years of age and older), is one component of a comprehensive surveillance program. It is not intended to diagnose infection nor to guide or monitor treatment. Performed at [facility], [address] [place], [place] [address]     Procedures and diagnostic studies:  DG Chest [facility] 1 View  Result Date: 12/01/2022 CLINICAL DATA:  [id number] Pacemaker [id number] EXAM: PORTABLE CHEST 1 VIEW COMPARISON:  11/13/2022 FINDINGS: Interval placement of left subclavian approach implanted cardiac device with leads projecting over the expected locations of  BUN 28*  --  24* 21  --  19  CREATININE 1.11  --  0.98 1.01  --  0.99  CALCIUM 9.0  --  8.7* 8.6*  --  8.5*  MG  --  2.0  --   --  2.1 2.2   GFR Estimated Creatinine Clearance: 71 mL/min (by C-G formula based on SCr of 0.99 mg/dL). Liver Function Tests: Recent Labs  Lab 11/30/2022 1952  AST 14*  ALT 13  ALKPHOS 70  BILITOT 0.8  PROT 7.1  ALBUMIN 3.9   No results for input(s): "LIPASE", "AMYLASE" in the last 168 hours. No results for input(s): "AMMONIA" in the last 168 hours. Coagulation profile No results for input(s): "INR", "PROTIME" in the last 168 hours.  CBC: Recent Labs  Lab 11/23/2022 1952 11/20/22 0426 11/21/22 0431 11/29/2022 0459  WBC 9.2 7.7 9.1 8.1  NEUTROABS 6.3  --   --   --   HGB 13.7 13.3 13.0 12.6*  HCT 42.0 42.2 39.9 38.2*  MCV 93.1 94.4 91.5 90.1  PLT 201 183 177 157   Cardiac Enzymes: No results for input(s): "CKTOTAL", "CKMB", "CKMBINDEX", "TROPONINI" in the last 168 hours. BNP (last 3 results) No results for input(s): "PROBNP" in the last 8760 hours. CBG: Recent Labs  Lab 11/21/22 0450  GLUCAP 138*   D-Dimer: Recent Labs    11/27/2022 1952  DDIMER 1.55*   Hgb A1c: No results for input(s): "HGBA1C" in the last 72 hours. Lipid Profile: No results for input(s): "CHOL", "HDL", "LDLCALC", "TRIG", "CHOLHDL", "LDLDIRECT" in the last 72 hours. Thyroid function studies: Recent Labs    12/04/2022 08-Dec-2145  TSH 2.819   Anemia work up: No results for input(s): "VITAMINB12", "FOLATE", "FERRITIN", "TIBC", "IRON", "RETICCTPCT" in the last 72 hours. Sepsis Labs: Recent Labs  Lab 12/09/2022 1952 11/20/22 0426 11/21/22 0431 11/09/2022 0459  WBC 9.2 7.7 9.1 8.1    Microbiology Recent Results (from the past 240 hour(s))  MRSA Next Gen by PCR, Nasal     Status: None   Collection Time: 11/21/22  5:00 AM   Specimen:  Nasal Mucosa; Nasal Swab  Result Value Ref Range Status   MRSA by PCR Next Gen NOT DETECTED NOT DETECTED Final    Comment: (NOTE) The GeneXpert MRSA Assay (FDA approved for NASAL specimens only), is one component of a comprehensive MRSA colonization surveillance program. It is not intended to diagnose MRSA infection nor to guide or monitor treatment for MRSA infections. Test performance is not FDA approved in patients less than 70 years old. Performed at [facility], [address] [place], [place] [address]   Surgical PCR screen     Status: Abnormal   Collection Time: 11/27/2022  5:47 AM   Specimen: Nasal Mucosa; Nasal Swab  Result Value Ref Range Status   MRSA, PCR NEGATIVE NEGATIVE Final   Staphylococcus aureus POSITIVE (A) NEGATIVE Final    Comment: (NOTE) The Xpert SA Assay (FDA approved for NASAL specimens in patients 19 years of age and older), is one component of a comprehensive surveillance program. It is not intended to diagnose infection nor to guide or monitor treatment. Performed at [facility], [address] [place], [place] [address]     Procedures and diagnostic studies:  DG Chest [facility] 1 View  Result Date: 12/01/2022 CLINICAL DATA:  [id number] Pacemaker [id number] EXAM: PORTABLE CHEST 1 VIEW COMPARISON:  11/13/2022 FINDINGS: Interval placement of left subclavian approach implanted cardiac device with leads projecting over the expected locations of

## 2022-11-22 NOTE — Discharge Instructions (Signed)
For 6 weeks, avoid lifting greater than 15 pounds or raising your left arm above your head. Please do not shower until Friday, 8/16 at the earliest and do not submerge your left chest in water (no baths, swimming) for at least 1 week or until you follow up with Dr. Eusebio Friendly can remove the clear bandage on your left chest if it starts to peel off, but please do NOT take off the steri strips underneath (thin rectangular strips). Take all your medicines as prescribed, including the antibiotic I have prescribed called Keflex (cefalexin). Please call Dr. Juliann Pares 's office at [facility] [phone]) or if you have any questions or concerns.

## 2022-11-22 NOTE — Progress Notes (Signed)
[facility] CLINIC CARDIOLOGY CONSULT NOTE       Patient ID: [name] MRN: [id number] DOB/AGE: [birth year] 83 y.o.  Admit date: 11/19/2022 Referring Physician Dr. Valente David Primary Physician Dr. Graciela Husbands Primary Cardiologist Dr. Juliann Pares Reason for Consultation chest pain, elevated troponin, possible AF vs SVT  HPI: Jesse "Jesse Allison" Rosey Allison is an 48yoM with a PMH of CAD s/p remote CABG (1990s), chronic HFrEF (35-40% with apical anteroseptal and anterior akinesis, 07/2019), COPD, AAA s/p EVAR (2008), HTN who presented to [facility] ED 11/19/2022 with a diaphoresis, chest pain that radiated to his shoulder blades associated with lightheadedness and weakness, different than his prior angina.  He presented similarly to Jesse Allison, ED on 8/5 where he was in SVT which terminated with vagal maneuver.  In and out of AF RVR to 160s/170s and sinus brady ~40s this admission. Now s/p Medtronic dual chamber PPM implantation 8/14  Interval History:  - noted right lateral thigh "deep" muscle pain that started overnight. Hx of R hip replacement and was apparently laying with something under his right leg in bed overnight.  - s/p successful dual chamber PPM implant this AM with Dr. Darrold Junker - intermittently pacing on tele, otherwise predominantly in NSR without tele evidence of more AF RVR - no chest pain, dyspnea, lightheadedness, dizziness   Review of systems complete and found to be negative unless listed above     Past Medical History:  Diagnosis Date   AAA (abdominal aortic aneurysm) (HCC)    s/p EVAR in 2008   Anemia    iron deficiency   Anxiety    Aortic atherosclerosis (HCC)    Arthritis    Chronic cough    since teeanage years   Complication of anesthesia    woke up during AAA repair. Had an epidural and does NOT want again.   COPD (chronic obstructive pulmonary disease) (HCC)    Coronary artery disease    Diverticulosis    Dyspnea    GERD (gastroesophageal reflux disease)    Gout    Hx  of CABG 09/1991   Hx of laryngeal cancer 2015   s/p surgery and XRT   Hypercholesterolemia    Hypertension    Hypothyroidism    Myocardial infarction (HCC) 1993   Obesity (BMI 30.0-34.9) 08/11/2019   PONV (postoperative nausea and vomiting)    Several times, vomitting for hours after surgery.   PVD (peripheral vascular disease) (HCC)    Sleep apnea    Spinal stenosis    TIA (transient ischemic attack)     Past Surgical History:  Procedure Laterality Date   ABDOMINAL AORTIC ANEURYSM REPAIR  2010   no longer followed on a regular basis   BACK SURGERY  2000   L4 . piece of vertebrae broke off and lodged into spine. had to remove the loose piece.   CARDIAC CATHETERIZATION     CATARACT EXTRACTION W/PHACO Left 12/12/2016   Procedure: CATARACT EXTRACTION PHACO AND INTRAOCULAR LENS PLACEMENT (IOC);  Surgeon: Galen Manila, MD;  Location: [facility] ORS;  Service: Ophthalmology;  Laterality: Left;  [place] 00:38.1 AP% 14.1 CDE 5.38 FLUID PACK LOT # [id number]   CATARACT EXTRACTION W/PHACO Right 12/26/2016   Procedure: CATARACT EXTRACTION PHACO AND INTRAOCULAR LENS PLACEMENT (IOC);  Surgeon: Galen Manila, MD;  Location: [facility] ORS;  Service: Ophthalmology;  Laterality: Right;  [place] 00:53.7 AP% 12.9 CDE 6.92 Fluid Pack Lot # [id number]   COLONOSCOPY WITH PROPOFOL N/A 06/22/2020   Procedure: COLONOSCOPY WITH PROPOFOL;  Surgeon: Regis Bill,  MD;  Location: [facility] ENDOSCOPY;  Service: Endoscopy;  Laterality: N/A;   CORONARY ARTERY BYPASS GRAFT  1993   EYE SURGERY Bilateral    LARYNGOSCOPY  2015   tumor removal followed by radiation treatments.   ROBOTIC ASSISTED LAPAROSCOPIC LYSIS OF ADHESION  07/15/2020   Procedure: XI ROBOTIC ASSISTED LAPAROSCOPIC LYSIS OF ADHESION;  Surgeon: Sung Amabile, DO;  Location: [facility] ORS;  Service: General;;   TESTICLAR CYST EXCISION     TOTAL HIP ARTHROPLASTY Right 08/21/2019   Procedure: TOTAL HIP ARTHROPLASTY ANTERIOR APPROACH;  Surgeon: Kennedy Bucker, MD;  Location:  [facility] ORS;  Service: Orthopedics;  Laterality: Right;    Medications Prior to Admission  Medication Sig Dispense Refill Last Dose   allopurinol (ZYLOPRIM) 300 MG tablet Take 1 tablet by mouth daily.   11/19/2022 at 1200   aspirin EC 81 MG tablet Take 1 tablet (81 mg total) by mouth in the morning and at bedtime. (Patient taking differently: Take 81 mg by mouth daily.) 30 tablet 0 11/19/2022 at AM   azelastine (ASTELIN) 0.1 % nasal spray Place 1-2 sprays into both nostrils 2 (two) times daily as needed for allergies.    PRN at PRN   bismuth subsalicylate (PEPTO BISMOL) 262 MG/15ML suspension Take 30 mLs by mouth every 6 (six) hours as needed for indigestion.   PRN at PRN   calcium carbonate (OSCAL) 1500 (600 Ca) MG TABS tablet Take 600 mg of elemental calcium by mouth at bedtime.    11/18/2022 at Unknown   Carboxymethylcellulose Sodium (LUBRICANT EYE DROPS OP) Place 1 drop into both eyes daily as needed (dry eye).   PRN at PRN   carvedilol (COREG) 3.125 MG tablet Take 6.25 mg by mouth 2 (two) times daily.   11/19/2022 at 1700   clopidogrel (PLAVIX) 75 MG tablet Take 75 mg by mouth daily.   11/19/2022 at 1200   cyanocobalamin 1000 MCG tablet Take 1,000 mcg by mouth daily after lunch.    11/19/2022 at 1200   doxazosin (CARDURA) 2 MG tablet Take 2 mg by mouth at bedtime.   11/18/2022 at Unknown   furosemide (LASIX) 40 MG tablet Take 40 mg by mouth daily.   11/19/2022 at Unknown   Iron, Ferrous Sulfate, 142 (45 Fe) MG TBCR Take 45 mg by mouth daily with lunch.    11/19/2022 at 1200   levothyroxine (SYNTHROID) 50 MCG tablet Take 75 mcg by mouth daily.   11/19/2022 at AM   losartan (COZAAR) 100 MG tablet Take 100 mg by mouth at bedtime.   11/19/2022 at 1700   lovastatin (MEVACOR) 20 MG tablet Take 20 mg by mouth at bedtime.   11/19/2022 at 1700   Misc Natural Products (OSTEO BI-FLEX JOINT SHIELD PO) Take 2 tablets by mouth daily.   11/19/2022 at Unknown   nitroGLYCERIN (NITROSTAT) 0.4 MG SL tablet Place 0.4 mg under  the tongue every 5 (five) minutes as needed for chest pain.   11/19/2022 at 1700   pantoprazole (PROTONIX) 40 MG tablet Take 40 mg by mouth daily before supper.   11/19/2022 at 1700   potassium chloride (KLOR-CON) 10 MEQ tablet Take 10 mEq by mouth daily.   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   Drug use: No   Sexual activity: Not on file  Other Topics Concern   Not on file  Social History Narrative   Lives in home with his wife.   Social Determinants of Health   Financial Resource Strain: Low Risk  (10/09/2022)   Received from [facility]   Overall Financial Resource Strain (CARDIA)    Difficulty of Paying Living Expenses: Not hard at all  Food Insecurity: No Food Insecurity (11/20/2022)   Hunger Vital Sign    Worried About Running Out of Food in the Last Year: Never true    Ran Out of Food in the Last Year: Never true  Transportation Needs: No Transportation Needs (11/20/2022)   PRAPARE - Administrator, Civil Service (Medical): No    Lack of Transportation (Non-Medical): No  Physical Activity: Not on file  Stress: Not on file  Social Connections: Not on file  Intimate Partner Violence: Not At Risk (11/20/2022)   Humiliation, Afraid, Rape, and Kick questionnaire    Fear of Current or Ex-Partner: No    Emotionally Abused: No    Physically Abused: No    Sexually Abused: No    Family  History  Problem Relation Age of Onset   Leukemia Mother    Heart attack Father    Stroke Sister       Intake/Output Summary (Last 24 hours) at 11/22/2022 0823 Last data filed at 11/22/2022 0600 Gross per 24 hour  Intake 1253.4 ml  Output 450 ml  Net 803.4 ml    Vitals:   11/22/22 0600 11/22/22 0630 11/22/22 0645 11/22/22 0710  BP: (!) 150/68 (!) 142/76 (!) 152/69 (!) 155/68  Pulse: 70 79 72 78  Resp: 20 14 (!) 21 (!) 22  Temp:    (!) 97.5 F (36.4 C)  TempSrc:    Oral  SpO2: 95% 94% 93% 93%  Weight:      Height:        PHYSICAL EXAM General: Pleasant elderly male, well nourished, in no acute distress.  Sitting upright in bed with two daughters at bedside HEENT:  Normocephalic and atraumatic. Neck:  No JVD.  Chest: left infraclavicular pacemaker pocket incision covered with multiple layers of gauze, with small area of dried blood, without active bleeding or oozing. Trace ecchymosis inferior to incision  Lungs: Normal respiratory effort on room air. Clear bilaterally to auscultation. No wheezes, crackles, rhonchi.  Heart: HRRR . Normal S1 and S2 without gallops or murmurs.  Abdomen: Non-distended appearing with excess adiposity  Msk: Normal strength and tone for age. Extremities: left arm in sling. Bilat LE with trace ankle edema. Right lateral thigh without tenderness to palpation.  Neuro: Alert and oriented X 3. Psych:  Answers questions appropriately.   Labs: Basic Metabolic Panel: Recent Labs    11/21/22 0820 11/21/22 2037 11/22/22 0459  NA 137  --  138  K 3.9  --  3.5  CL 103  --  104  CO2 27  --  28  GLUCOSE 130*  --  122*  BUN 21  --  19  CREATININE 1.01  --  0.99  CALCIUM 8.6*  --  8.5*  MG  --  2.1 2.2   Liver Function Tests: Recent Labs    11/19/22 1952  AST 14*  ALT 13  ALKPHOS 70  BILITOT 0.8  PROT 7.1  ALBUMIN 3.9   No results for input(s): "LIPASE", "AMYLASE" in the last 72 hours. CBC: Recent Labs    11/19/22 1952 11/20/22 0426  11/21/22 0431 11/22/22 0459  WBC 9.2   < > 9.1 8.1  NEUTROABS 6.3  --   --   --   HGB 13.7   < > 13.0 12.6*  HCT 42.0   < > 39.9 38.2*  MCV 93.1   < > 91.5 90.1  PLT 201   < > 177 157   < > = values in this interval not displayed.   Cardiac Enzymes: Recent Labs    11/20/22 0233 11/20/22 0426 11/20/22 0942  TROPONINIHS 475* 656* 458*   BNP: Recent Labs    11/19/22 1950  BNP 120.5*   D-Dimer: Recent Labs    11/19/22 1952  DDIMER 1.55*   Hemoglobin A1C: No results for input(s): "HGBA1C" in the last 72 hours. Fasting Lipid Panel: No results for input(s): "CHOL", "HDL", "LDLCALC", "TRIG", "CHOLHDL", "LDLDIRECT" in the last 72 hours. Thyroid Function Tests: Recent Labs    11/19/22 2147  TSH 2.819   Anemia Panel: No results for input(s): "VITAMINB12", "FOLATE", "FERRITIN", "TIBC", "IRON", "RETICCTPCT" in the last 72 hours.   Radiology: ECHOCARDIOGRAM COMPLETE  Result Date: 11/20/2022    ECHOCARDIOGRAM REPORT   Patient Name:   SAYEED LIPE Date of Exam: 11/20/2022 Medical Rec #:  [id number]        Height:       71.0 in Accession #:    [id number]       Weight:       241.8 lb Date of Birth:  June 23, 1940         BSA:          2.286 m Patient Age:    82 years         BP:           146/85 mmHg Patient Gender: M                HR:           86 bpm. Exam Location:  [facility] Procedure: 2D Echo, Cardiac Doppler, Color Doppler and Strain Analysis Indications:     Atrial Fibrillation I48.91  History:         Patient has prior history of Echocardiogram examinations, most                  recent 07/29/2019. COPD; Risk Factors:Hypertension. AAA.  Sonographer:     Cristela Blue Referring Phys:  [id number] JAN A MANSY Diagnosing Phys: Alwyn Pea MD  Sonographer Comments: Suboptimal apical window. Global longitudinal strain was attempted. IMPRESSIONS  1. Mild ant/apical hypo EF=50%.  2. Left ventricular ejection fraction, by estimation, is 45 to 50%. The left ventricle has mildly decreased function.  The left ventricle demonstrates regional wall motion abnormalities (see scoring diagram/findings for description). The left ventricular  internal cavity size was moderately to severely dilated. There is moderate concentric left ventricular hypertrophy. Left ventricular diastolic parameters are consistent with Grade I diastolic dysfunction (impaired relaxation).  3. Right ventricular systolic function is low normal. The right ventricular size is normal. Moderately increased right ventricular wall thickness.  4. The mitral valve is normal in structure. Trivial mitral valve regurgitation.  5. The aortic valve is normal in structure. Aortic valve regurgitation is not visualized. Aortic valve sclerosis is present, with no evidence of aortic valve stenosis. FINDINGS  Left Ventricle: Left ventricular ejection fraction, by estimation, is 45 to 50%. The left ventricle has mildly decreased function. The left ventricle demonstrates regional wall motion abnormalities. The left ventricular internal cavity size was moderately to severely dilated. There is moderate concentric left ventricular hypertrophy. Left ventricular diastolic parameters are consistent with Grade I diastolic dysfunction (impaired relaxation). Right Ventricle: The right ventricular size is normal. Moderately increased right ventricular wall thickness. Right ventricular systolic function is low normal. Left Atrium: Left atrial size was normal in size. Right Atrium: Right atrial size was normal in size. Pericardium: There is no evidence of pericardial effusion. Mitral Valve: The mitral valve is normal in structure. Trivial mitral valve regurgitation. Tricuspid Valve: The tricuspid valve is normal in structure. Tricuspid valve regurgitation is trivial. Aortic Valve: The aortic valve is normal in structure. Aortic valve regurgitation is not visualized. Aortic valve sclerosis is present, with no evidence of aortic valve stenosis. Aortic valve mean gradient measures  2.0 mmHg. Aortic valve peak gradient measures 3.4 mmHg. Aortic valve area, by VTI measures 2.32 cm. Pulmonic Valve: The pulmonic valve was normal in structure. Pulmonic valve regurgitation is not visualized. Aorta: The ascending aorta was not well visualized. IAS/Shunts: No atrial level shunt detected by color flow Doppler. Additional Comments: Mild ant/apical hypo EF=50%.  LEFT VENTRICLE PLAX 2D LVIDd:         5.80 cm      Diastology LVIDs:         4.50 cm      LV e' medial:    4.03 cm/s LV PW:         1.40 cm      LV E/e' medial:  15.1 LV IVS:        1.40 cm      LV e' lateral:   12.50 cm/s LVOT diam:     2.10 cm      LV E/e' lateral: 4.9 LV SV:         55 LV SV Index:   24 LVOT Area:     3.46 cm  LV Volumes (MOD) LV vol d, MOD A4C: 157.0 ml LV vol s, MOD A4C: 77.4 ml LV SV MOD A4C:     157.0 ml RIGHT VENTRICLE RV Basal diam:  4.30 cm RV Mid diam:    3.10 cm RV S prime:     8.59 cm/s TAPSE (M-mode): 2.7 cm LEFT ATRIUM           Index        RIGHT ATRIUM           Index LA diam:      3.50 cm 1.53 cm/m   RA Area:     23.30 cm LA Vol (A4C): 67.2 ml 29.40 ml/m  RA Volume:   61.10 ml  26.73 ml/m  AORTIC VALVE AV Area (Vmax):    1.99 cm AV Area (Vmean):   2.01 cm AV Area (VTI):     2.32 cm AV Vmax:           91.60 cm/s AV Vmean:          69.000 cm/s AV VTI:            0.236 m AV Peak Grad:  3.4 mmHg AV Mean Grad:      2.0 mmHg LVOT Vmax:         52.70 cm/s LVOT Vmean:        40.000 cm/s LVOT VTI:          0.158 m LVOT/AV VTI ratio: 0.67  AORTA Ao Root diam: 3.70 cm MITRAL VALVE               TRICUSPID VALVE MV Area (PHT): 3.06 cm    TR Peak grad:   13.5 mmHg MV Decel Time: 248 msec    TR Vmax:        184.00 cm/s MV E velocity: 60.80 cm/s MV A velocity: 96.90 cm/s  SHUNTS MV E/A ratio:  0.63        Systemic VTI:  0.16 m                            Systemic Diam: 2.10 cm Alwyn Pea MD Electronically signed by Alwyn Pea MD Signature Date/Time: 11/20/2022/2:06:31 PM    Final    CT Angio Chest PE  W and/or Wo Contrast  Result Date: 11/19/2022 CLINICAL DATA:  Chest pain, evaluate for PE EXAM: CT ANGIOGRAPHY CHEST WITH CONTRAST TECHNIQUE: Multidetector CT imaging of the chest was performed using the standard protocol during bolus administration of intravenous contrast. Multiplanar CT image reconstructions and MIPs were obtained to evaluate the vascular anatomy. RADIATION DOSE REDUCTION: This exam was performed according to the departmental dose-optimization program which includes automated exposure control, adjustment of the mA and/or kV according to patient size and/or use of iterative reconstruction technique. CONTRAST:  75mL OMNIPAQUE IOHEXOL 350 MG/ML SOLN COMPARISON:  Chest radiograph dated 11/13/2022. FINDINGS: Cardiovascular: Satisfactory opacification of the bilateral pulmonary arteries to the segmental level. Bilateral lower lobe pulmonary arteries are mildly motion degraded. Within that constraint, there is no evidence of pulmonary embolism. Study is not tailored for evaluation of the thoracic aorta. No evidence of thoracic aortic aneurysm. Atherosclerotic calcifications of the arch. Mild cardiomegaly. No pericardial effusion. Postsurgical changes related to prior CABG. Severe three-vessel coronary atherosclerosis. Mediastinum/Nodes: No suspicious mediastinal lymphadenopathy. Visualized thyroid is unremarkable. Lungs/Pleura: Evaluation of the lung parenchyma is constrained by respiratory motion, particularly within the bilateral lower lobes. Within that constraint, there are no suspicious pulmonary nodules. Mild scarring/atelectasis in the lingula and bilateral lower lobes. Associated eventration of the left hemidiaphragm. No focal consolidation. No pleural effusion or pneumothorax. Upper Abdomen: Is notable for an aortic stent, vascular calcifications, and multiple simple left renal cysts measuring up to 4.5 cm (series 4/image 153). No follow-up is recommended. Musculoskeletal: Degenerative changes  of the visualized thoracolumbar spine. Median sternotomy. Review of the MIP images confirms the above findings. IMPRESSION: No evidence of pulmonary embolism. No acute cardiopulmonary disease. Aortic Atherosclerosis (ICD10-I70.0). Electronically Signed   By: Charline Bills M.D.   On: 11/19/2022 21:52   DG Chest 2 View  Result Date: 11/13/2022 CLINICAL DATA:  svt EXAM: CHEST - 2 VIEW COMPARISON:  Chest x-ray 10/28/2010 FINDINGS: The heart and mediastinal contours are unchanged. Elevated left hemidiaphragm with no definite hiatal hernia. Left base atelectasis. No focal consolidation. No pulmonary edema. No pleural effusion. No pneumothorax. No acute osseous abnormality.  Sternotomy wires are intact. IMPRESSION: No active cardiopulmonary disease. Electronically Signed   By: Tish Frederickson M.D.   On: 11/13/2022 18:56    ECHO 07/28/2019  1. Left ventricular ejection fraction, by estimation, is 35 to 40%.  The  left ventricle has moderately decreased function. The left ventricle  demonstrates regional wall motion abnormalities (see scoring  diagram/findings for description). The left  ventricular internal cavity size was mildly dilated. Left ventricular  diastolic parameters were normal.   2. Right ventricular systolic function is normal. The right ventricular  size is normal. There is normal pulmonary artery systolic pressure.   3. Left atrial size was mildly dilated.   4. Right atrial size was mildly dilated.   5. The mitral valve is normal in structure. Mild to moderate mitral valve  regurgitation.   6. The aortic valve is normal in structure. Aortic valve regurgitation is  not visualized.   FINDINGS   Left Ventricle: Left ventricular ejection fraction, by estimation, is 35  to 40%. The left ventricle has moderately decreased function. The left  ventricle demonstrates regional wall motion abnormalities. Severe akinesis  of the left ventricular,  mid-apical anteroseptal wall and anterior  wall. The left ventricular  internal cavity size was mildly dilated. There is no left ventricular  hypertrophy. Left ventricular diastolic parameters were normal.   TELEMETRY reviewed by me (LT) 11/22/2022 : intermittent A sensed V paced rhythm and NSR rate 60s-70s  EKG reviewed by me: sinus brady 1*av block with PACs  Data reviewed by me (LT) 11/22/2022: hospitalist progress note, nursing notes,  last 24h vitals tele labs imaging I/O   Principal Problem:   Chest pain Active Problems:   Hypothyroidism   Paroxysmal atrial fibrillation with RVR (HCC)   Dyslipidemia   GERD without esophagitis   Essential hypertension   Gout   Acute HFrEF (heart failure with reduced ejection fraction) (HCC)    ASSESSMENT AND PLAN:  Leonette Most "Jesse Allison" Rosey Allison is an 24yoM with a PMH of CAD s/p remote CABG (1990s), chronic HFrEF (35-40% with apical anteroseptal and anterior akinesis, 07/2019), COPD, AAA s/p EVAR (2008), HTN who presented to [facility] ED 11/19/2022 with a diaphoresis, chest pain that radiated to his shoulder blades associated with lightheadedness and weakness, different than his prior angina.  He presented similarly to Jesse Allison, ED on 8/5 where he was in SVT which terminated with vagal maneuver.  In and out of AF RVR to 160s/170s and sinus brady ~40s this admission.   # Paroxysmal AF RVR  # tachy-brady syndrome # s/p Medtronic Dual Chamber PPM  Symptomatic with waves of heat and chest pain that radiates between his shoulder blades, generalized weakness but notably different than his prior angina. In and out of AF RVR to the 170s and sinus brady in the low-mid 40s this admission. Uptitration of rate and rhythm control limited by baseline bradycardia. Now s/p successful DC PPM implant 8/14 -Change IV amiodarone to p.o. amiodarone 200 mg twice daily x 10 days, then 200 mg daily thereafter -Increase metoprolol tartrate to 50 mg twice daily -Monitor and replenish electrolytes for a goal K >4, mag  >2 -Heparin drip off pre-pacemaker, start Eliquis 5 mg twice daily starting this evening at 2200 -Will drop aspirin, continue clopidogrel + Eliquis -Chest x-ray shows appropriate pulse generator and lead positioning without evidence of pneumothorax. -Will start Keflex 500 mg p.o. twice daily x 10 days for pacemaker pocket infection prophylaxis tomorrow, 8/15 -Discussed arm precautions, postprocedural care with the patient and 2 daughters at bedside, also placed instructions in chart.  # right thigh pain  Started the evening of 8/13 which he describes as a deep muscle ache.  LE Doppler ultrasound ordered by primary. ?MSK pain from high intensity  statin vs muscle soreness from limited mobility while the patient has been admitted.  # Demand ischemia # CAD s/p remote CABG Troponins elevated and trending 11, 70, 475, 656, 458, EKG nonischemic.  This is most consistent with demand ischemia in the setting of new onset paroxysmal AF RVR and not ACS. -No plan for invasive cardiac diagnostics  -Decrease atorvastatin from 80 mg to 40 mg daily.  # Chronic HF improved EF Echo this admission with improved EF from 35-40% with apical anteroseptal and anterior akinesis up to 45-50% with mild anterior/apical hypokinesis with moderate-severe LV dilation, G1 DD, and trivial MR. -Euvolemic on exam -GDMT of metoprolol and losartan 100 mg daily -will aim to add SGLT2i, MRA as BP and renal function allow  Anticipate discharge readiness from a cardiac perspective tomorrow, 8/15.  Will arrange for follow-up in clinic with Dr. Juliann Pares in 1 to 2 weeks or sooner if needed.  This patient's plan of care was discussed and created with Dr. Juliann Pares and he is in agreement.  Signed: Rebeca Allegra , PA-C 11/22/2022, 8:23 AM [facility]

## 2022-11-23 ENCOUNTER — Encounter: Payer: Self-pay | Admitting: Cardiology

## 2022-11-23 ENCOUNTER — Other Ambulatory Visit (HOSPITAL_COMMUNITY): Payer: Self-pay

## 2022-11-23 ENCOUNTER — Inpatient Hospital Stay: Payer: PPO

## 2022-11-23 DIAGNOSIS — M25551 Pain in right hip: Secondary | ICD-10-CM | POA: Diagnosis not present

## 2022-11-23 DIAGNOSIS — Z96641 Presence of right artificial hip joint: Secondary | ICD-10-CM | POA: Diagnosis not present

## 2022-11-23 DIAGNOSIS — I469 Cardiac arrest, cause unspecified: Secondary | ICD-10-CM | POA: Insufficient documentation

## 2022-11-23 LAB — BASIC METABOLIC PANEL
Anion gap: 5 (ref 5–15)
BUN: 24 mg/dL — ABNORMAL HIGH (ref 8–23)
CO2: 29 mmol/L (ref 22–32)
Calcium: 8.5 mg/dL — ABNORMAL LOW (ref 8.9–10.3)
Chloride: 104 mmol/L (ref 98–111)
Creatinine, Ser: 1.26 mg/dL — ABNORMAL HIGH (ref 0.61–1.24)
GFR, Estimated: 57 mL/min — ABNORMAL LOW (ref >60)
Glucose, Bld: 117 mg/dL — ABNORMAL HIGH (ref 70–99)
Potassium: 4.6 mmol/L (ref 3.5–5.1)
Sodium: 138 mmol/L (ref 135–145)

## 2022-11-23 LAB — CBC
HCT: 39.6 % (ref 39.0–52.0)
Hemoglobin: 12.6 g/dL — ABNORMAL LOW (ref 13.0–17.0)
MCH: 30.4 pg (ref 26.0–34.0)
MCHC: 31.8 g/dL (ref 30.0–36.0)
MCV: 95.4 fL (ref 80.0–100.0)
Platelets: 161 10*3/uL (ref 150–400)
RBC: 4.15 MIL/uL — ABNORMAL LOW (ref 4.22–5.81)
RDW: 14.5 % (ref 11.5–15.5)
WBC: 8.4 10*3/uL (ref 4.0–10.5)
nRBC: 0 % (ref 0.0–0.2)

## 2022-11-23 MED ORDER — AMIODARONE HCL 200 MG PO TABS: ORAL_TABLET | ORAL | 0 refills | Status: DC

## 2022-11-23 MED ORDER — ORAL CARE MOUTH RINSE: 15.0000 mL | OROMUCOSAL | Status: DC | PRN

## 2022-11-23 MED ORDER — ACETAMINOPHEN 325 MG PO TABS: 650.0000 mg | ORAL_TABLET | Freq: Four times a day (QID) | ORAL | 0 refills | Status: DC | PRN

## 2022-11-23 MED ORDER — METOPROLOL TARTRATE 50 MG PO TABS: 50.0000 mg | ORAL_TABLET | Freq: Two times a day (BID) | ORAL | 0 refills | Status: DC

## 2022-11-23 MED ORDER — LOSARTAN POTASSIUM 100 MG PO TABS: 100.0000 mg | ORAL_TABLET | Freq: Every day | ORAL | 0 refills | Status: DC

## 2022-11-23 MED ORDER — CEPHALEXIN 500 MG PO CAPS: 500.0000 mg | ORAL_CAPSULE | Freq: Two times a day (BID) | ORAL | 0 refills | Status: DC

## 2022-11-23 MED ORDER — TRAZODONE HCL 50 MG PO TABS: 25.0000 mg | ORAL_TABLET | Freq: Every evening | ORAL | 0 refills | Status: DC | PRN

## 2022-11-23 MED ORDER — CEFAZOLIN SODIUM-DEXTROSE 1-4 GM/50ML-% IV SOLN
1.0000 g | Freq: Once | INTRAVENOUS | Status: DC
  Filled 2022-11-23: qty 50

## 2022-11-23 MED ORDER — CHLORHEXIDINE GLUCONATE CLOTH 2 % EX PADS: 6.0000 | MEDICATED_PAD | Freq: Every day | CUTANEOUS | 0 refills | Status: DC

## 2022-11-23 MED ORDER — AMLODIPINE BESYLATE 5 MG PO TABS: 5.0000 mg | ORAL_TABLET | Freq: Every day | ORAL | 1 refills | Status: DC

## 2022-11-23 MED ORDER — APIXABAN 5 MG PO TABS: 5.0000 mg | ORAL_TABLET | Freq: Two times a day (BID) | ORAL | 0 refills | Status: DC

## 2022-11-23 MED ORDER — HYDROCODONE-ACETAMINOPHEN 5-325 MG PO TABS
1.0000 | ORAL_TABLET | Freq: Four times a day (QID) | ORAL | Status: DC | PRN
  Administered 2022-11-23: 1 via ORAL
  Filled 2022-11-23: qty 1

## 2022-11-23 MED ORDER — IPRATROPIUM-ALBUTEROL 0.5-2.5 (3) MG/3ML IN SOLN
3.0000 mL | Freq: Four times a day (QID) | RESPIRATORY_TRACT | Status: DC
  Administered 2022-11-23: 3 mL via RESPIRATORY_TRACT
  Filled 2022-11-23: qty 3

## 2022-11-23 MED ORDER — IPRATROPIUM-ALBUTEROL 0.5-2.5 (3) MG/3ML IN SOLN: 3.0000 mL | Freq: Four times a day (QID) | RESPIRATORY_TRACT | Status: DC | PRN

## 2022-11-23 NOTE — Progress Notes (Addendum)
Progress Note   Patient: Jesse Allison UJW:[id number] DOB: 24-Jul-1940 DOA: 11/19/2022     3 DOS: the patient was seen and examined on 11/23/2022   Brief hospital course: Taken from H&P.  Jesse Allison is a 82 y.o. Caucasian male with medical history significant for coronary artery disease status post three-vessel CABG, COPD, GERD, hypertension, dyslipidemia and hypothyroidism, presented to the emergency room with acute onset of chest pain graded 5/10 in severity and felt as pressure between shoulder blades with associated diaphoresis, jitteriness and weakness.  The patient admitted to nausea without vomiting.   Patient was tachycardic on presentation, EKG shows A-fib with RVR, incomplete right bundle branch block, anteroseptal and inferior Q waves. CTA chest with no PE or acute cardiopulmonary disease.  Patient had recurrent atrial fibrillation overnight.  Received Cardizem bolus followed by Cardizem infusion.  Troponin trending up so he was started on heparin infusion.  Cardiology was consulted.  8/12: Vitals and labs stable, troponin peaked at 656, likely secondary to demand ischemia with going in and out of A-fib with RVR, BNP 120.  Echocardiogram pending. Cardiology started amiodarone infusion.  8/13: Patient with recurrent in and out of A-fib with RVR, becoming significantly bradycardic in between, concern of tachybradycardia syndrome.  He was transferred to stepdown.  Cardiology is planning pacemaker placement tomorrow morning.  Echocardiogram with EF of 45 to 50%, regional wall motion abnormalities, dilated cardiomyopathy and grade 1 diastolic dysfunction.    Assessment and Plan: Chest pain-resolved as of 11/23/2022: The patient has a history of coronary artery disease status post three-vessel CABG. troponin peaked at 656, likely secondary to demand ischemia with multiple episodes of A-fib with RVR.  Echocardiogram with low EF and regional wall motion abnormalities. Cardiology has  been consulted and pt was started on IV heparin infusion  Acute HFrEF (heart failure with reduced ejection fraction) (HCC)-resolved as of 11/23/2022 Echocardiogram with EF of 40 to 45%, moderate to severe dilated cardiomyopathy, regional wall motion abnormalities and grade 1 diastolic dysfunction. Continue lasix 40mg  daily  Paroxysmal atrial fibrillation with RVR (HCC)-resolved as of 11/23/2022 Initially responded to Cardizem infusion. Pt then went in and out of A-fib with RVR. Amiodarone infusion was started. Becoming significantly bradycardic in between -concerning for tachybradycardia syndrome. Patient was was then transferred to stepdown and cardiology team performed pacemaker placement (Medtronic Dual Chamber PPM) 11/22/22. Chest x-ray shows appropriate pulse generator and lead positioning without evidence of pneumothorax. Cardiology revaluated on 11/23/22 and recommended to discharge the patient with p.o. amiodarone 200 mg twice daily x 10 days, then 200 mg daily thereafter, continue metoprolol tartrate to 50 mg twice daily, continue Eliquis 5 mg twice daily Per cardiology recommendations, stopped aspirin, and continued clopidogrel + Eliquis and started Keflex 500 mg p.o. twice daily x 10 days for pacemaker pocket infection prophylaxis   Right hip pain: Obtain Rip hip Xray 2-3 views Pain control with Norco prn  Essential hypertension: continue his antihypertensives.  Dyslipidemia: continue statin therapy.  Hypothyroidism: TSH normal. Continue home Synthroid  Gout: continue allopurinol.  GERD without esophagitis: continue PPI therapy.  Physical Debility: with right leg pain limiting his mobility hi and having episodes of dizziness with standing and PT not able to progress walking- deferred discharge for now.       Subjective: Patient reported having headache and dizziness with standing, has difficultly getting up and walking. Mild pain at left upper chest where he had the  procedure. Reported severe right hip pain, radiation to right thigh Denies dyspnea  Reported mild nausea, no vomiting Denies abdominal pain, constipation/diarrhea  Physical Exam: Vitals:   11/23/22 0402 11/23/22 0905 11/23/22 0905 11/23/22 1212  BP: 130/60 (!) 130/57 (!) 130/57 (!) 123/59  Pulse: 60  (!) 58 63  Resp: 19   16  Temp: 97.8 F (36.6 C)   (!) 97.5 F (36.4 C)  TempSrc: Oral     SpO2: 90%  98% 91%  Weight:      Height:       Physical Exam Constitutional:      General: He is not in acute distress.    Appearance: He is well-developed. He is not ill-appearing.  HENT:     Head: Normocephalic and atraumatic.  Cardiovascular:     Rate and Rhythm: Normal rate and regular rhythm.     Heart sounds: Normal heart sounds.  Pulmonary:     Effort: Pulmonary effort is normal.     Breath sounds: Normal breath sounds.  Musculoskeletal:     Cervical back: Normal range of motion and neck supple.     Comments: Left upper chest wall with minimal tenderness at site of PPM insertion. Also, pain elicited with minimal movements at right hip region Skin:    General: Skin is warm.     Capillary Refill: Capillary refill takes 2 to 3 seconds.  Neurological:     Mental Status: He is alert and oriented to person, place, and time.     Cranial Nerves: No cranial nerve deficit.  Psychiatric:        Mood and Affect: Mood normal.        Behavior: Behavior normal.   Data Reviewed:  Latest Reference Range & Units 11/23/22 06:17  Sodium 135 - 145 mmol/L 138  Potassium 3.5 - 5.1 mmol/L 4.6  Chloride 98 - 111 mmol/L 104  CO2 22 - 32 mmol/L 29  Glucose 70 - 99 mg/dL 086 (H)  BUN 8 - 23 mg/dL 24 (H)  Creatinine 5.78 - 1.24 mg/dL 4.69 (H)  Calcium 8.9 - 10.3 mg/dL 8.5 (L)  Anion gap 5 - 15  5  GFR, Estimated >60 mL/min 57 (L)  WBC 4.0 - 10.5 K/uL 8.4  RBC 4.22 - 5.81 MIL/uL 4.15 (L)  Hemoglobin 13.0 - 17.0 g/dL 62.9 (L)  HCT 52.8 - 41.3 % 39.6  MCV 80.0 - 100.0 fL 95.4  MCH 26.0 - 34.0 pg  30.4  MCHC 30.0 - 36.0 g/dL 24.4  RDW 01.0 - 27.2 % 14.5  Platelets 150 - 400 K/uL 161  nRBC 0.0 - 0.2 % 0.0  (H): Data is abnormally high (L): Data is abnormally low  Family Communication: updated wife at bedside  Disposition: Status is: Inpatient   Planned Discharge Destination: Home    Time spent: 35 minutes  Author: Ernestene Mention, MD 11/23/2022 3:24 PM  For on call review [email or web address].

## 2022-11-23 NOTE — Evaluation (Signed)
Occupational Therapy Evaluation Patient Details Name: Jesse Allison MRN: [id number] DOB: [birth year] Today's Date: 11/23/2022   History of Present Illness Patient is a 82 year old male presenting with chest pain. Found to have atrial fibrillation with RVR. Developed bradycardia, s/p pacemaker placement 11/22/22   Clinical Impression   Pt was seen for OT evaluation this date. Prior to hospital admission, pt was independent with ADL, using a rollator PRN. Pt endorsing pain at start of session, RN providing pain medication. Pt agreeable despite pain. Pt presents to acute OT demonstrating impaired ADL performance and functional mobility 2/2 significant radiating R hip pain, dizziness with positional changes, and impaired LUE functional use following recent pacemaker placement (See OT problem list for additional functional deficits). Unable to formally assess balance or ADL mobility 2/2 pain/dizziness with sitting EOB. Pt required MIN A for bed mobility, MIN-MOD A for lateral scoots EOB. Unable to get full orthostatic vitals due to pain and increasing dizziness. Supine BP 127/66 HR 76, sitting BP 129/74 HR 84. Care team notified via secure chat. Anticipate pt will require MOD A for seated UB ADL tasks 2/2 LUE restrictions at this time and 2/2 R hip radiating pain, MOD A for LB ADL tasks. MIN-MOD A for lateral scoots this session, too pain limited to attempt standing. Pt would benefit from skilled OT services to address noted impairments and functional limitations (see below for any additional details) in order to maximize safety and independence while minimizing falls risk and caregiver burden.      If plan is discharge home, recommend the following: A little help with walking and/or transfers;A lot of help with bathing/dressing/bathroom;Assistance with cooking/housework;Assist for transportation;Help with stairs or ramp for entrance    Functional Status Assessment  Patient has had a recent decline in  their functional status and demonstrates the ability to make significant improvements in function in a reasonable and predictable amount of time.  Equipment Recommendations  None recommended by OT    Recommendations for Other Services       Precautions / Restrictions Precautions Precautions: ICD/Pacemaker;Fall Precaution Comments: NWB LUE with sling in place during session Restrictions Weight Bearing Restrictions: No      Mobility Bed Mobility Overal bed mobility: Needs Assistance Bed Mobility: Supine to Sit, Sit to Supine     Supine to sit: Min assist, HOB elevated Sit to supine: Min assist        Transfers Overall transfer level: Needs assistance Equipment used: None Transfers: Bed to chair/wheelchair/BSC            Lateral/Scoot Transfers: Min assist, Mod assist        Balance Overall balance assessment: Needs assistance Sitting-balance support: Feet supported Sitting balance-Leahy Scale: Good                                     ADL either performed or assessed with clinical judgement   ADL                                         General ADL Comments: Anticipate pt will require MOD A for seated UB ADL tasks 2/2 LUE restrictions at this time and 2/2 R hip radiating pain, MOD A for LB ADL tasks. MIN-MOD A for lateral scoots this session, too pain limited to attempt standing.  Vision         Perception         Praxis         Pertinent Vitals/Pain Pain Assessment Pain Assessment: 0-10 Pain Score: 8  Pain Location: R lateral and anterior thigh, low back, posterior headache Pain Descriptors / Indicators: Radiating, Moaning, Grimacing, Guarding, Headache, Shooting, Sharp, Restless Pain Intervention(s): Limited activity within patient's tolerance, Monitored during session, Repositioned, RN gave pain meds during session     Extremity/Trunk Assessment Upper Extremity Assessment Upper Extremity Assessment: LUE  deficits/detail;Overall WFL for tasks assessed LUE Deficits / Details: LUE in sling and did not formally assess   Lower Extremity Assessment Lower Extremity Assessment: Defer to PT evaluation;RLE deficits/detail;LLE deficits/detail RLE Deficits / Details: pain with movement. no focal weakness is noted bilaterally RLE Sensation: history of peripheral neuropathy LLE Sensation: history of peripheral neuropathy       Communication Communication Communication: No apparent difficulties   Cognition Arousal: Alert Behavior During Therapy: WFL for tasks assessed/performed Overall Cognitive Status: Within Functional Limits for tasks assessed                                       General Comments  Pt was too pain limited and dizzy to stand. Supine BP 127/66 HR 76, sitting BP 129/74 HR 84.    Exercises     Shoulder Instructions      Home Living Family/patient expects to be discharged to:: Private residence Living Arrangements: Spouse/significant other Available Help at Discharge: Family Type of Home: House Home Access: Level entry (has alternate entry with 3 steps)     Home Layout: One level               Home Equipment: Rollator (4 wheels);Cane - single point          Prior Functioning/Environment Prior Level of Function : Independent/Modified Independent             Mobility Comments: use 4 wheeled walker intermittently          OT Problem List: Pain;Impaired UE functional use;Decreased knowledge of use of DME or AE      OT Treatment/Interventions: Self-care/ADL training;Therapeutic exercise;Therapeutic activities;DME and/or AE instruction;Patient/family education    OT Goals(Current goals can be found in the care plan section) Acute Rehab OT Goals Patient Stated Goal: have less pain OT Goal Formulation: With patient/family Time For Goal Achievement: 12/07/22 Potential to Achieve Goals: Good ADL Goals Pt Will Perform Upper Body Dressing:  with caregiver independent in assisting;sitting Pt Will Perform Lower Body Dressing: with caregiver independent in assisting;sit to/from stand Pt Will Transfer to Toilet: with supervision;ambulating (LRAD) Pt Will Perform Toileting - Clothing Manipulation and hygiene: with modified independence  OT Frequency: Min 1X/week    Co-evaluation              AM-PAC OT "6 Clicks" Daily Activity     Outcome Measure Help from another person eating meals?: None Help from another person taking care of personal grooming?: A Little Help from another person toileting, which includes using toliet, bedpan, or urinal?: A Lot Help from another person bathing (including washing, rinsing, drying)?: A Lot Help from another person to put on and taking off regular upper body clothing?: A Lot Help from another person to put on and taking off regular lower body clothing?: A Lot 6 Click Score: 15   End of Session Nurse Communication:  Other (comment);Mobility status (pain, dizzy, BPs; notified care team in secure chat re: pt/family's request for additional assessment/imaging of hip/back)  Activity Tolerance: Patient limited by pain;Other (comment) (dizzy, headache, R hip pain) Patient left: in bed;with call bell/phone within reach;with family/visitor present  OT Visit Diagnosis: Other abnormalities of gait and mobility (R26.89);Pain Pain - Right/Left: Right Pain - part of body: Hip;Leg                Time: 2952-8413 OT Time Calculation (min): 22 min Charges:  OT General Charges $OT Visit: 1 Visit OT Evaluation $OT Eval Low Complexity: 1 Low  Arman Filter., MPH, MS, OTR/L ascom [phone] 11/23/22, 4:46 PM

## 2022-11-23 NOTE — Progress Notes (Signed)
[facility] CLINIC CARDIOLOGY CONSULT NOTE       Patient ID: [name] MRN: [id number] DOB/AGE: 1940/09/16 82 y.o.  Admit date: 11/19/2022 Referring Physician Dr. Valente David Primary Physician Dr. Graciela Husbands Primary Cardiologist Dr. Juliann Pares Reason for Consultation chest pain, elevated troponin, possible AF vs SVT  HPI: Jesse "Virl Diamond" Rosey Allison is an 26yoM with a PMH of CAD s/p remote CABG (1990s), chronic HFrEF (35-40% with apical anteroseptal and anterior akinesis, 07/2019), COPD, AAA s/p EVAR (2008), HTN who presented to [facility] ED 11/19/2022 with a diaphoresis, chest pain that radiated to his shoulder blades associated with lightheadedness and weakness, different than his prior angina.  He presented similarly to [facility] ED on 8/5 where he was in SVT which terminated with vagal maneuver.  In and out of AF RVR to 160s/170s and sinus brady ~40s this admission. Now s/p Medtronic dual chamber PPM implantation 8/14  Interval History:  -Notified by nursing this a.m. regarding ? bradycardia and pacemaker not capturing on telemetry, telemetry strips reviewed personally which did not show evidence of sinus bradycardia <58 BPM, Jesse patient also has some premature atrial and premature ventricular contractions. Device interrogated by myself and reviewed by Dr. Darrold Junker as well reveals appropriate lead threshold and impedance with 55% atrial pacing without need for ventricular pacing. -At my time of evaluation this morning patient denies recurrence of Jesse hot flushed sensation and chest discomfort like he was experiencing previously while in AF RVR.  He had some transient lightheadedness when transferring from Jesse bed to Jesse chair and continues to have deep right thigh pain and chronic neck pain requiring IV pain medication for this.   Review of systems complete and found to be negative unless listed above     Past Medical History:  Diagnosis Date   AAA (abdominal aortic aneurysm) (HCC)    s/p EVAR in  2008   Anemia    iron deficiency   Anxiety    Aortic atherosclerosis (HCC)    Arthritis    Chronic cough    since teeanage years   Complication of anesthesia    woke up during AAA repair. Had an epidural and does NOT want again.   COPD (chronic obstructive pulmonary disease) (HCC)    Coronary artery disease    Diverticulosis    Dyspnea    GERD (gastroesophageal reflux disease)    Gout    Hx of CABG 09/1991   Hx of laryngeal cancer 2015   s/p surgery and XRT   Hypercholesterolemia    Hypertension    Hypothyroidism    Myocardial infarction (HCC) 1993   Obesity (BMI 30.0-34.9) 08/11/2019   PONV (postoperative nausea and vomiting)    Several times, vomitting for hours after surgery.   PVD (peripheral vascular disease) (HCC)    Sleep apnea    Spinal stenosis    TIA (transient ischemic attack)     Past Surgical History:  Procedure Laterality Date   ABDOMINAL AORTIC ANEURYSM REPAIR  2010   no longer followed on a regular basis   BACK SURGERY  2000   L4 . piece of vertebrae broke off and lodged into spine. had to remove Jesse loose piece.   CARDIAC CATHETERIZATION     CATARACT EXTRACTION W/PHACO Left 12/12/2016   Procedure: CATARACT EXTRACTION PHACO AND INTRAOCULAR LENS PLACEMENT (IOC);  Surgeon: Galen Manila, MD;  Location: [facility] ORS;  Service: Ophthalmology;  Laterality: Left;  [place] 00:38.1 AP% 14.1 CDE 5.38 FLUID PACK LOT # [id number]   CATARACT EXTRACTION  W/PHACO Right 12/26/2016   Procedure: CATARACT EXTRACTION PHACO AND INTRAOCULAR LENS PLACEMENT (IOC);  Surgeon: Galen Manila, MD;  Location: [facility] ORS;  Service: Ophthalmology;  Laterality: Right;  [place] 00:53.7 AP% 12.9 CDE 6.92 Fluid Pack Lot # [id number]   COLONOSCOPY WITH PROPOFOL N/A 06/22/2020   Procedure: COLONOSCOPY WITH PROPOFOL;  Surgeon: Regis Bill, MD;  Location: [facility] ENDOSCOPY;  Service: Endoscopy;  Laterality: N/A;   CORONARY ARTERY BYPASS GRAFT  1993   EYE SURGERY Bilateral    LARYNGOSCOPY  2015    tumor removal followed by radiation treatments.   PACEMAKER IMPLANT N/A 11/22/2022   Procedure: PACEMAKER IMPLANT;  Surgeon: Marcina Millard, MD;  Location: [facility] INVASIVE CV LAB;  Service: Cardiovascular;  Laterality: N/A;   ROBOTIC ASSISTED LAPAROSCOPIC LYSIS OF ADHESION  07/15/2020   Procedure: XI ROBOTIC ASSISTED LAPAROSCOPIC LYSIS OF ADHESION;  Surgeon: Sung Amabile, DO;  Location: [facility] ORS;  Service: General;;   TESTICLAR CYST EXCISION     TOTAL HIP ARTHROPLASTY Right 08/21/2019   Procedure: TOTAL HIP ARTHROPLASTY ANTERIOR APPROACH;  Surgeon: Kennedy Bucker, MD;  Location: [facility] ORS;  Service: Orthopedics;  Laterality: Right;    Medications Prior to Admission  Medication Sig Dispense Refill Last Dose   allopurinol (ZYLOPRIM) 300 MG tablet Take 1 tablet by mouth daily.   11/19/2022 at 1200   aspirin EC 81 MG tablet Take 1 tablet (81 mg total) by mouth in Jesse morning and at bedtime. (Patient taking differently: Take 81 mg by mouth daily.) 30 tablet 0 11/19/2022 at AM   azelastine (ASTELIN) 0.1 % nasal spray Place 1-2 sprays into both nostrils 2 (two) times daily as needed for allergies.    PRN at PRN   bismuth subsalicylate (PEPTO BISMOL) 262 MG/15ML suspension Take 30 mLs by mouth every 6 (six) hours as needed for indigestion.   PRN at PRN   calcium carbonate (OSCAL) 1500 (600 Ca) MG TABS tablet Take 600 mg of elemental calcium by mouth at bedtime.    11/18/2022 at Unknown   Carboxymethylcellulose Sodium (LUBRICANT EYE DROPS OP) Place 1 drop into both eyes daily as needed (dry eye).   PRN at PRN   carvedilol (COREG) 3.125 MG tablet Take 6.25 mg by mouth 2 (two) times daily.   11/19/2022 at 1700   clopidogrel (PLAVIX) 75 MG tablet Take 75 mg by mouth daily.   11/19/2022 at 1200   cyanocobalamin 1000 MCG tablet Take 1,000 mcg by mouth daily after lunch.    11/19/2022 at 1200   doxazosin (CARDURA) 2 MG tablet Take 2 mg by mouth at bedtime.   11/18/2022 at Unknown   furosemide (LASIX) 40 MG tablet Take  40 mg by mouth daily.   11/19/2022 at Unknown   Iron, Ferrous Sulfate, 142 (45 Fe) MG TBCR Take 45 mg by mouth daily with lunch.    11/19/2022 at 1200   levothyroxine (SYNTHROID) 50 MCG tablet Take 75 mcg by mouth daily.   11/19/2022 at AM   losartan (COZAAR) 100 MG tablet Take 100 mg by mouth at bedtime.   11/19/2022 at 1700   lovastatin (MEVACOR) 20 MG tablet Take 20 mg by mouth at bedtime.   11/19/2022 at 1700   Misc Natural Products (OSTEO BI-FLEX JOINT SHIELD PO) Take 2 tablets by mouth daily.   11/19/2022 at Unknown   nitroGLYCERIN (NITROSTAT) 0.4 MG SL tablet Place 0.4 mg under Jesse tongue every 5 (five) minutes as needed for chest pain.   11/19/2022 at 1700   pantoprazole (PROTONIX) 40  MG tablet Take 40 mg by mouth daily before supper.   11/19/2022 at 1700   potassium chloride (KLOR-CON) 10 MEQ tablet Take 10 mEq by mouth daily.   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   Drug use: No   Sexual activity: Not on file  Other Topics Concern   Not on file  Social History Narrative   Lives in home with his wife.   Social Determinants of Health   Financial Resource Strain: Low Risk  (10/09/2022)   Received from [facility]   Overall Financial Resource Strain (CARDIA)    Difficulty of Paying Living Expenses: Not hard at all  Food Insecurity: No Food Insecurity (11/20/2022)   Hunger Vital Sign    Worried About Running  Out of Food in Jesse Last Year: Never true    Ran Out of Food in Jesse Last Year: Never true  Transportation Needs: No Transportation Needs (11/20/2022)   PRAPARE - Administrator, Civil Service (Medical): No    Lack of Transportation (Non-Medical): No  Physical Activity: Not on file  Stress: Not on file  Social Connections: Not on file  Intimate Partner Violence: Not At Risk (11/20/2022)   Humiliation, Afraid, Rape, and Kick questionnaire    Fear of Current or Ex-Partner: No    Emotionally Abused: No    Physically Abused: No    Sexually Abused: No    Family History  Problem Relation Age of Onset   Leukemia Mother    Heart attack Father    Stroke Sister       Intake/Output Summary (Last 24 hours) at 11/23/2022 0852 Last data filed at 11/23/2022 0711 Gross per 24 hour  Intake 660.23 ml  Output 5 ml  Net 655.23 ml    Vitals:   11/22/22 1728 11/22/22 1934 11/22/22 2339 11/23/22 0402  BP: (!) 152/65 132/60 (!) 121/53 130/60  Pulse: 69 63 60 60  Resp: 16 18 18 19   Temp: 97.7 F (36.5 C) 98.3 F (36.8 C) 97.9 F (36.6 C) 97.8 F (36.6 C)  TempSrc: Oral Oral Oral Oral  SpO2: 95% 93% 92% 90%  Weight:      Height:        PHYSICAL EXAM General: Pleasant elderly male, well nourished, in no acute distress.  Sitting upright in recliner with daughter at bedside.   HEENT:  Normocephalic and atraumatic. Neck:  No JVD.  Chest: left infraclavicular pacemaker pocket incision covered with multiple layers of gauze, with small area of dried blood, without active bleeding or oozing.  Lungs: Normal respiratory effort on room air. Clear bilaterally to auscultation. No wheezes, crackles, rhonchi.  Heart: HRRR . Normal S1 and S2 without gallops or murmurs.  Abdomen: Non-distended appearing with excess adiposity  Msk: Normal strength and  tone for age. Extremities: left arm in sling. Bilat LE with trace ankle edema.  Neuro: Alert and oriented X 3, fatigued after receiving IV morphine  30 minutes prior to my time of evaluation.Marland Kitchen Psych:  Answers questions appropriately.   Labs: Basic Metabolic Panel: Recent Labs    11/21/22 2037 11/22/22 0459 11/23/22 0617  NA  --  138 138  K  --  3.5 4.6  CL  --  104 104  CO2  --  28 29  GLUCOSE  --  122* 117*  BUN  --  19 24*  CREATININE  --  0.99 1.26*  CALCIUM  --  8.5* 8.5*  MG 2.1 2.2  --    Liver Function Tests: No results for input(s): "AST", "ALT", "ALKPHOS", "BILITOT", "PROT", "ALBUMIN" in Jesse last 72 hours.  No results for input(s): "LIPASE", "AMYLASE" in Jesse last 72 hours. CBC: Recent Labs    11/22/22 0459 11/23/22 0617  WBC 8.1 8.4  HGB 12.6* 12.6*  HCT 38.2* 39.6  MCV 90.1 95.4  PLT 157 161   Cardiac Enzymes: Recent Labs    11/20/22 0942  TROPONINIHS 458*   BNP: No results for input(s): "BNP" in Jesse last 72 hours.  D-Dimer: No results for input(s): "DDIMER" in Jesse last 72 hours.  Hemoglobin A1C: No results for input(s): "HGBA1C" in Jesse last 72 hours. Fasting Lipid Panel: No results for input(s): "CHOL", "HDL", "LDLCALC", "TRIG", "CHOLHDL", "LDLDIRECT" in Jesse last 72 hours. Thyroid Function Tests: No results for input(s): "TSH", "T4TOTAL", "T3FREE", "THYROIDAB" in Jesse last 72 hours.  Invalid input(s): "FREET3"  Anemia Panel: No results for input(s): "VITAMINB12", "FOLATE", "FERRITIN", "TIBC", "IRON", "RETICCTPCT" in Jesse last 72 hours.   Radiology: US Venous Img Lower Bilateral (DVT)  Result Date: 11/22/2022 CLINICAL DATA:  Left lower extremity pain and swelling EXAM: Bilateral lower Extremity Venous Doppler Ultrasound TECHNIQUE: Gray-scale sonography with compression, as well as color and duplex ultrasound, were performed to evaluate Jesse deep venous system(s) from Jesse level of Jesse common femoral vein through Jesse popliteal and proximal calf veins. COMPARISON:  08/12/2019 FINDINGS: VENOUS Normal compressibility of Jesse common femoral, superficial femoral, and popliteal veins, as well as Jesse  visualized calf veins. Visualized portions of profunda femoral vein and great saphenous vein unremarkable. No filling defects to suggest DVT on grayscale or color Doppler imaging. Doppler waveforms show normal direction of venous flow, normal respiratory plasticity and response to augmentation. OTHER None. Limitations: none IMPRESSION: No  lower extremity DVT. Electronically Signed   By: Acquanetta Belling M.D.   On: 11/22/2022 16:47   DG Chest Port 1 View  Result Date: 11/22/2022 CLINICAL DATA:  [id number] Pacemaker [id number] EXAM: PORTABLE CHEST 1 VIEW COMPARISON:  11/13/2022 FINDINGS: Interval placement of left subclavian approach implanted cardiac device with leads projecting over Jesse expected locations of Jesse right atrium and right ventricle. Stable heart size status post sternotomy and CABG. Low lung volumes with elevation of Jesse left hemidiaphragm. Mild left basilar atelectasis. No pneumothorax. IMPRESSION: Interval placement of left subclavian approach implanted cardiac device. No pneumothorax. Electronically Signed   By: Duanne Guess D.O.   On: 11/22/2022 12:42   EP PPM/ICD IMPLANT  Result Date: 11/22/2022 Successful dual-chamber MRI compatible rate responsive pacemaker generator implantation   ECHOCARDIOGRAM COMPLETE  Result Date: 11/20/2022    ECHOCARDIOGRAM REPORT   Patient Name:   Jesse Allison Date of Exam: 11/20/2022 Medical Rec #:  [id number]        Height:  71.0 in Accession #:    [id number]       Weight:       241.8 lb Date of Birth:  02-21-1941         BSA:          2.286 m Patient Age:    82 years         BP:           146/85 mmHg Patient Gender: M                HR:           86 bpm. Exam Location:  [facility] Procedure: 2D Echo, Cardiac Doppler, Color Doppler and Strain Analysis Indications:     Atrial Fibrillation I48.91  History:         Patient has prior history of Echocardiogram examinations, most                  recent 07/29/2019. COPD; Risk Factors:Hypertension. AAA.  Sonographer:      Cristela Blue Referring Phys:  [id number] JAN A MANSY Diagnosing Phys: Alwyn Pea MD  Sonographer Comments: Suboptimal apical window. Global longitudinal strain was attempted. IMPRESSIONS  1. Mild ant/apical hypo EF=50%.  2. Left ventricular ejection fraction, by estimation, is 45 to 50%. Jesse left ventricle has mildly decreased function. Jesse left ventricle demonstrates regional wall motion abnormalities (see scoring diagram/findings for description). Jesse left ventricular  internal cavity size was moderately to severely dilated. There is moderate concentric left ventricular hypertrophy. Left ventricular diastolic parameters are consistent with Grade I diastolic dysfunction (impaired relaxation).  3. Right ventricular systolic function is low normal. Jesse right ventricular size is normal. Moderately increased right ventricular wall thickness.  4. Jesse mitral valve is normal in structure. Trivial mitral valve regurgitation.  5. Jesse aortic valve is normal in structure. Aortic valve regurgitation is not visualized. Aortic valve sclerosis is present, with no evidence of aortic valve stenosis. FINDINGS  Left Ventricle: Left ventricular ejection fraction, by estimation, is 45 to 50%. Jesse left ventricle has mildly decreased function. Jesse left ventricle demonstrates regional wall motion abnormalities. Jesse left ventricular internal cavity size was moderately to severely dilated. There is moderate concentric left ventricular hypertrophy. Left ventricular diastolic parameters are consistent with Grade I diastolic dysfunction (impaired relaxation). Right Ventricle: Jesse right ventricular size is normal. Moderately increased right ventricular wall thickness. Right ventricular systolic function is low normal. Left Atrium: Left atrial size was normal in size. Right Atrium: Right atrial size was normal in size. Pericardium: There is no evidence of pericardial effusion. Mitral Valve: Jesse mitral valve is normal in structure. Trivial  mitral valve regurgitation. Tricuspid Valve: Jesse tricuspid valve is normal in structure. Tricuspid valve regurgitation is trivial. Aortic Valve: Jesse aortic valve is normal in structure. Aortic valve regurgitation is not visualized. Aortic valve sclerosis is present, with no evidence of aortic valve stenosis. Aortic valve mean gradient measures 2.0 mmHg. Aortic valve peak gradient measures 3.4 mmHg. Aortic valve area, by VTI measures 2.32 cm. Pulmonic Valve: Jesse pulmonic valve was normal in structure. Pulmonic valve regurgitation is not visualized. Aorta: Jesse ascending aorta was not well visualized. IAS/Shunts: No atrial level shunt detected by color flow Doppler. Additional Comments: Mild ant/apical hypo EF=50%.  LEFT VENTRICLE PLAX 2D LVIDd:         5.80 cm      Diastology LVIDs:         4.50 cm      LV e' medial:  4.03 cm/s LV PW:         1.40 cm      LV E/e' medial:  15.1 LV IVS:        1.40 cm      LV e' lateral:   12.50 cm/s LVOT diam:     2.10 cm      LV E/e' lateral: 4.9 LV SV:         55 LV SV Index:   24 LVOT Area:     3.46 cm  LV Volumes (MOD) LV vol d, MOD A4C: 157.0 ml LV vol s, MOD A4C: 77.4 ml LV SV MOD A4C:     157.0 ml RIGHT VENTRICLE RV Basal diam:  4.30 cm RV Mid diam:    3.10 cm RV S prime:     8.59 cm/s TAPSE (M-mode): 2.7 cm LEFT ATRIUM           Index        RIGHT ATRIUM           Index LA diam:      3.50 cm 1.53 cm/m   RA Area:     23.30 cm LA Vol (A4C): 67.2 ml 29.40 ml/m  RA Volume:   61.10 ml  26.73 ml/m  AORTIC VALVE AV Area (Vmax):    1.99 cm AV Area (Vmean):   2.01 cm AV Area (VTI):     2.32 cm AV Vmax:           91.60 cm/s AV Vmean:          69.000 cm/s AV VTI:            0.236 m AV Peak Grad:      3.4 mmHg AV Mean Grad:      2.0 mmHg LVOT Vmax:         52.70 cm/s LVOT Vmean:        40.000 cm/s LVOT VTI:          0.158 m LVOT/AV VTI ratio: 0.67  AORTA Ao Root diam: 3.70 cm MITRAL VALVE               TRICUSPID VALVE MV Area (PHT): 3.06 cm    TR Peak grad:   13.5 mmHg MV Decel  Time: 248 msec    TR Vmax:        184.00 cm/s MV E velocity: 60.80 cm/s MV A velocity: 96.90 cm/s  SHUNTS MV E/A ratio:  0.63        Systemic VTI:  0.16 m                            Systemic Diam: 2.10 cm Alwyn Pea MD Electronically signed by Alwyn Pea MD Signature Date/Time: 11/20/2022/2:06:31 PM    Final    CT Angio Chest PE W and/or Wo Contrast  Result Date: 11/19/2022 CLINICAL DATA:  Chest pain, evaluate for PE EXAM: CT ANGIOGRAPHY CHEST WITH CONTRAST TECHNIQUE: Multidetector CT imaging of Jesse chest was performed using Jesse standard protocol during bolus administration of intravenous contrast. Multiplanar CT image reconstructions and MIPs were obtained to evaluate Jesse vascular anatomy. RADIATION DOSE REDUCTION: This exam was performed according to Jesse departmental dose-optimization program which includes automated exposure control, adjustment of the mA and/or kV according to patient size and/or use of iterative reconstruction technique. CONTRAST:  75mL OMNIPAQUE IOHEXOL 350 MG/ML SOLN COMPARISON:  Chest radiograph dated 11/13/2022. FINDINGS: Cardiovascular: Satisfactory opacification of Jesse  bilateral pulmonary arteries to Jesse segmental level. Bilateral lower lobe pulmonary arteries are mildly motion degraded. Within that constraint, there is no evidence of pulmonary embolism. Study is not tailored for evaluation of Jesse thoracic aorta. No evidence of thoracic aortic aneurysm. Atherosclerotic calcifications of Jesse arch. Mild cardiomegaly. No pericardial effusion. Postsurgical changes related to prior CABG. Severe three-vessel coronary atherosclerosis. Mediastinum/Nodes: No suspicious mediastinal lymphadenopathy. Visualized thyroid is unremarkable. Lungs/Pleura: Evaluation of Jesse lung parenchyma is constrained by respiratory motion, particularly within Jesse bilateral lower lobes. Within that constraint, there are no suspicious pulmonary nodules. Mild scarring/atelectasis in Jesse lingula and  bilateral lower lobes. Associated eventration of Jesse left hemidiaphragm. No focal consolidation. No pleural effusion or pneumothorax. Upper Abdomen: Is notable for an aortic stent, vascular calcifications, and multiple simple left renal cysts measuring up to 4.5 cm (series 4/image 153). No follow-up is recommended. Musculoskeletal: Degenerative changes of Jesse visualized thoracolumbar spine. Median sternotomy. Review of Jesse MIP images confirms Jesse above findings. IMPRESSION: No evidence of pulmonary embolism. No acute cardiopulmonary disease. Aortic Atherosclerosis (ICD10-I70.0). Electronically Signed   By: Charline Bills M.D.   On: 11/19/2022 21:52   DG Chest 2 View  Result Date: 11/13/2022 CLINICAL DATA:  svt EXAM: CHEST - 2 VIEW COMPARISON:  Chest x-ray 10/28/2010 FINDINGS: Jesse heart and mediastinal contours are unchanged. Elevated left hemidiaphragm with no definite hiatal hernia. Left base atelectasis. No focal consolidation. No pulmonary edema. No pleural effusion. No pneumothorax. No acute osseous abnormality.  Sternotomy wires are intact. IMPRESSION: No active cardiopulmonary disease. Electronically Signed   By: Tish Frederickson M.D.   On: 11/13/2022 18:56    ECHO 07/28/2019  1. Left ventricular ejection fraction, by estimation, is 35 to 40%. Jesse  left ventricle has moderately decreased function. Jesse left ventricle  demonstrates regional wall motion abnormalities (see scoring  diagram/findings for description). Jesse left  ventricular internal cavity size was mildly dilated. Left ventricular  diastolic parameters were normal.   2. Right ventricular systolic function is normal. Jesse right ventricular  size is normal. There is normal pulmonary artery systolic pressure.   3. Left atrial size was mildly dilated.   4. Right atrial size was mildly dilated.   5. Jesse mitral valve is normal in structure. Mild to moderate mitral valve  regurgitation.   6. Jesse aortic valve is normal in structure. Aortic  valve regurgitation is  not visualized.   FINDINGS   Left Ventricle: Left ventricular ejection fraction, by estimation, is 35  to 40%. Jesse left ventricle has moderately decreased function. Jesse left  ventricle demonstrates regional wall motion abnormalities. Severe akinesis  of Jesse left ventricular,  mid-apical anteroseptal wall and anterior wall. Jesse left ventricular  internal cavity size was mildly dilated. There is no left ventricular  hypertrophy. Left ventricular diastolic parameters were normal.   TELEMETRY reviewed by me (LT) 11/23/2022 : intermittent A paced rhythm and NSR with occasional PACs and PVCs average rate 60 bpm  EKG reviewed by me: sinus brady 1*av block with PACs  Data reviewed by me (LT) 11/23/2022: hospitalist progress note, nursing notes,  last 24h vitals tele labs imaging I/O   Principal Problem:   Chest pain Active Problems:   Hypothyroidism   Paroxysmal atrial fibrillation with RVR (HCC)   Dyslipidemia   GERD without esophagitis   Essential hypertension   Gout   Acute HFrEF (heart failure with reduced ejection fraction) (HCC)    ASSESSMENT AND PLAN:  Jesse Allison is an 53yoM with a PMH  of CAD s/p remote CABG (1990s), chronic HFrEF (35-40% with apical anteroseptal and anterior akinesis, 07/2019), COPD, AAA s/p EVAR (2008), HTN who presented to [facility] ED 11/19/2022 with a diaphoresis, chest pain that radiated to his shoulder blades associated with lightheadedness and weakness, different than his prior angina.  He presented similarly to [facility] ED on 8/5 where he was in SVT which terminated with vagal maneuver.  In and out of AF RVR to 160s/170s and sinus brady ~40s this admission.   # Paroxysmal AF RVR  # tachy-brady syndrome # s/p Medtronic Dual Chamber PPM  Symptomatic with waves of heat and chest pain that radiates between his shoulder blades, generalized weakness but notably different than his prior angina. In and out of AF RVR to Jesse 170s and  sinus brady in Jesse low-mid 40s this admission. Uptitration of rate and rhythm control limited by baseline bradycardia. Now s/p successful DC PPM implant 8/14 -Okay to remove pressure dressing, leave Steri-Strips in place until outpatient follow up  -Continue p.o. amiodarone 200 mg twice daily x 10 days, then 200 mg daily thereafter -Continue metoprolol tartrate to 50 mg twice daily -Monitor and replenish electrolytes for a goal K >4, mag >2 -Continue Eliquis 5 mg twice daily -Will drop aspirin, continue clopidogrel + Eliquis -Chest x-ray shows appropriate pulse generator and lead positioning without evidence of pneumothorax. -Start Keflex 500 mg p.o. twice daily x 10 days for pacemaker pocket infection prophylaxis  -Discussed arm precautions, postprocedural care with Jesse patient and 2 daughters at bedside, also placed instructions in chart.  # right thigh pain  Started Jesse evening of 8/13 which he describes as a deep muscle ache.  LE Doppler ultrasound ordered by primary. ?  Myalgias from high intensity statin vs muscle soreness from limited mobility while Jesse patient has been admitted. -Hold atorvastatin 40 mg daily, restart home lovastatin at discharge  # Demand ischemia # CAD s/p remote CABG Troponins elevated and trending 11, 70, 475, 656, 458, EKG nonischemic.  This is most consistent with demand ischemia in Jesse setting of new onset paroxysmal AF RVR and not ACS. -No plan for invasive cardiac diagnostics   # Chronic HF improved EF Echo this admission with improved EF from 35-40% with apical anteroseptal and anterior akinesis up to 45-50% with mild anterior/apical hypokinesis with moderate-severe LV dilation, G1 DD, and trivial MR. -Euvolemic on exam -GDMT of metoprolol and losartan 100 mg daily -will aim to add SGLT2i, MRA as BP and renal function allow  Okay for discharge from a cardiac perspective. Will arrange for follow-up in clinic with Dr. Juliann Pares in 1 to 2 weeks or sooner if  needed.  This patient's plan of care was discussed and created with Dr. Juliann Pares and he is in agreement.  Signed: Rebeca Allegra , PA-C 11/23/2022, 8:52 AM [facility]

## 2022-11-23 NOTE — Plan of Care (Signed)
  Problem: Education: Goal: Knowledge of General Education information will improve Description: Including pain rating scale, medication(s)/side effects and non-pharmacologic comfort measures Outcome: Progressing   Problem: Clinical Measurements: Goal: Respiratory complications will improve Outcome: Progressing   Problem: Clinical Measurements: Goal: Cardiovascular complication will be avoided Outcome: Progressing   Problem: Activity: Goal: Risk for activity intolerance will decrease Outcome: Progressing   Problem: Elimination: Goal: Will not experience complications related to urinary retention Outcome: Progressing   Problem: Pain Managment: Goal: General experience of comfort will improve Outcome: Progressing   Problem: Safety: Goal: Ability to remain free from injury will improve Outcome: Progressing

## 2022-11-23 NOTE — Care Management Important Message (Signed)
Important Message  Patient Details  Name: Jesse Allison MRN: [id number] Date of Birth: 1940-12-17   Medicare Important Message Given:  Yes     [name] 11/23/2022, 1:11 PM

## 2022-11-23 NOTE — Evaluation (Signed)
Physical Therapy Evaluation Patient Details Name: Jesse Allison MRN: [id number] DOB: 21-Aug-1940 Today's Date: 11/23/2022  History of Present Illness  Patient is a 82 year old male presenting with chest pain. Found to have atrial fibrillation with RVR. Developed bradycardia, s/p pacemaker placement 11/22/22   Clinical Impression  Patient agreeable to PT evaluation. He lives with family and is independent with occasional use of 4 wheeled walker at baseline.  Today the patient complains of right leg pain, low back pain, and posterior headache. He is mildly dizzy with sitting upright and standing. Standing tolerance is limited to less than 3 minutes and patient declined attempting to walk due to dizziness. He has a hard time getting comfortable in bed after standing due to right leg pain with tips provided on positioning and using pillows for comfort. The patient does not feel he would be able to go home safely today. Recommend to continue PT to maximize independence and facilitate return to prior level of function.       If plan is discharge home, recommend the following: A little help with walking and/or transfers;A little help with bathing/dressing/bathroom;Help with stairs or ramp for entrance;Assist for transportation;Assistance with cooking/housework   Can travel by private vehicle        Equipment Recommendations None recommended by PT  Recommendations for Other Services       Functional Status Assessment Patient has had a recent decline in their functional status and demonstrates the ability to make significant improvements in function in a reasonable and predictable amount of time.     Precautions / Restrictions Precautions Precautions: ICD/Pacemaker;Fall (NWB LUE with sling in place during session) Restrictions Weight Bearing Restrictions: No      Mobility  Bed Mobility Overal bed mobility: Needs Assistance Bed Mobility: Supine to Sit, Sit to Supine     Supine to sit:  Min assist     General bed mobility comments: education to avoid pulling with LUE with sling in place throughout session. assistance required for trunk support    Transfers Overall transfer level: Needs assistance Equipment used: Quad cane Transfers: Sit to/from Stand Sit to Stand: Min assist           General transfer comment: lifting assistance required to stand. patient reports dizziness once standing    Ambulation/Gait             Pre-gait activities: patient does not feel comfortable walking away from the bed due to dizziness. he was able to take 3 small side steps with steadying assistance using cane in right arm for support    Stairs            Wheelchair Mobility     Tilt Bed    Modified Rankin (Stroke Patients Only)       Balance Overall balance assessment: Needs assistance Sitting-balance support: Feet supported Sitting balance-Leahy Scale: Good     Standing balance support: Single extremity supported Standing balance-Leahy Scale: Fair Standing balance comment: contact guard assistance provided for safety                             Pertinent Vitals/Pain Pain Assessment Pain Assessment: Faces Faces Pain Scale: Hurts whole lot Pain Location: R lateral and anterior thigh, low back, posterior headache    Home Living Family/patient expects to be discharged to:: Private residence Living Arrangements: Spouse/significant other Available Help at Discharge: Family Type of Home: House Home Access: Level entry (has alternate entry  with 3 steps)       Home Layout: One level Home Equipment: Rollator (4 wheels);Cane - single point      Prior Function Prior Level of Function : Independent/Modified Independent             Mobility Comments: use 4 wheeled walker intermittently       Extremity/Trunk Assessment   Upper Extremity Assessment Upper Extremity Assessment:  (LUE in sling and did not formally assess. RUE appears  WFL with functional observation)    Lower Extremity Assessment Lower Extremity Assessment: RLE deficits/detail;LLE deficits/detail RLE Deficits / Details: pain with movement. no focal weakness is noted bilaterally RLE Sensation: history of peripheral neuropathy LLE Sensation: history of peripheral neuropathy       Communication   Communication Communication: No apparent difficulties  Cognition Arousal: Lethargic Behavior During Therapy: WFL for tasks assessed/performed Overall Cognitive Status: Within Functional Limits for tasks assessed                                          General Comments      Exercises     Assessment/Plan    PT Assessment Patient needs continued PT services  PT Problem List Decreased strength;Decreased range of motion;Decreased activity tolerance;Decreased balance;Decreased mobility       PT Treatment Interventions DME instruction;Gait training;Stair training;Functional mobility training;Therapeutic activities;Therapeutic exercise;Balance training;Neuromuscular re-education;Cognitive remediation;Patient/family education    PT Goals (Current goals can be found in the Care Plan section)  Acute Rehab PT Goals Patient Stated Goal: to go home PT Goal Formulation: With patient Time For Goal Achievement: 12/07/22 Potential to Achieve Goals: Fair    Frequency Min 1X/week     Co-evaluation               AM-PAC PT "6 Clicks" Mobility  Outcome Measure Help needed turning from your back to your side while in a flat bed without using bedrails?: A Little Help needed moving from lying on your back to sitting on the side of a flat bed without using bedrails?: A Little Help needed moving to and from a bed to a chair (including a wheelchair)?: A Little Help needed standing up from a chair using your arms (e.g., wheelchair or bedside chair)?: A Little Help needed to walk in hospital room?: A Little Help needed climbing 3-5 steps with  a railing? : A Little 6 Click Score: 18    End of Session   Activity Tolerance: Patient limited by fatigue Patient left: in bed;with call bell/phone within reach;with family/visitor present Nurse Communication: Mobility status;Patient requests pain meds PT Visit Diagnosis: Unsteadiness on feet (R26.81);Muscle weakness (generalized) (M62.81)    Time: 8119-1478 PT Time Calculation (min) (ACUTE ONLY): 23 min   Charges:   PT Evaluation $PT Eval Low Complexity: 1 Low PT Treatments $Therapeutic Activity: 8-22 mins PT General Charges $$ ACUTE PT VISIT: 1 Visit         Donna Bernard, PT, MPT   Ina Homes 11/23/2022, 3:12 PM

## 2022-11-23 NOTE — TOC Benefit Eligibility Note (Signed)
Patient Product/process development scientist completed.    The patient is insured through [employer]/ [employer]. Patient has Medicare and is not eligible for a copay card, but may be able to apply for patient assistance, if available.    Ran test claim for Eliquis 5 mg and the current 30 day co-pay is $47.00.   This test claim was processed through [facility]- copay amounts may vary at other pharmacies due to pharmacy/plan contracts, or as the patient moves through the different stages of their insurance plan.     Roland Earl, CPHT Pharmacy Patient Advocate Specialist [facility] Patient Advocate Team Direct Number: [phone]  Fax: [phone]

## 2022-11-23 NOTE — TOC Initial Note (Addendum)
Transition of Care [facility]) - Initial/Assessment Note    Patient Details  Name: Jesse Allison MRN: [id number] Date of Birth: May 05, 1940  Transition of Care [facility]) CM/SW Contact:    Darolyn Rua, LCSW Phone Number: 11/23/2022, 3:37 PM  Clinical Narrative:                  Patient from home with spouse, CSW notes PT recs for Home health, they are in agreement with [facility] they had in the past. Home Health agency name is now [facility], referral given to [place] with [facility]. They report having 3in1 and RW at home, no other dme needs.   Update 4:32 [facility] unable to accept, referral given to Adelina Mings with [facility]     Expected Discharge Plan: Home w Home Health Services Barriers to Discharge: Continued Medical Work up   Patient Goals and CMS Choice Patient states their goals for this hospitalization and ongoing recovery are:: to go home CMS Medicare.gov Compare Post Acute Care list provided to:: Patient Represenative (must comment) (spouse)        Expected Discharge Plan and Services       Living arrangements for the past 2 months: Single Family Home Expected Discharge Date: 11/23/22                                    Prior Living Arrangements/Services Living arrangements for the past 2 months: Single Family Home Lives with:: Spouse                   Activities of Daily Living Home Assistive Devices/Equipment: Environmental consultant (specify type) ADL Screening (condition at time of admission) Patient's cognitive ability adequate to safely complete daily activities?: Yes Is the patient deaf or have difficulty hearing?: No Does the patient have difficulty seeing, even when wearing glasses/contacts?: No Does the patient have difficulty concentrating, remembering, or making decisions?: No Patient able to express need for assistance with ADLs?: Yes Does the patient have difficulty dressing or bathing?: No Independently performs ADLs?: Yes (appropriate for  developmental age) Does the patient have difficulty walking or climbing stairs?: No Weakness of Legs: None Weakness of Arms/Hands: None  Permission Sought/Granted                  Emotional Assessment              Admission diagnosis:  Atrial arrhythmia [I49.8] Acute chest pain [R07.9] Chest pain [R07.9] Patient Active Problem List   Diagnosis Date Noted   Dyslipidemia 11/20/2022   GERD without esophagitis 11/20/2022   Essential hypertension 11/20/2022   Gout 11/20/2022   SBO (small bowel obstruction) (HCC) 06/07/2020   Chronic systolic CHF (congestive heart failure) (HCC) 06/07/2020   HTN (hypertension) 06/07/2020   TIA (transient ischemic attack) 06/07/2020   Hypothyroidism 06/07/2020   Cellulitis and abscess of toe 01/05/2020   Injury of toenail, left, initial encounter 10/02/2019   Diet-controlled type 2 diabetes mellitus (HCC) 09/09/2019   Status post total hip replacement, right 08/21/2019   Coagulation disorder (HCC) 11/14/2018   Anxiety 08/15/2018   Benign essential hypertension 08/15/2018   CAD (coronary artery disease), autologous vein bypass graft 08/15/2018   COPD (chronic obstructive pulmonary disease) (HCC) 08/15/2018   Diverticulosis 08/15/2018   Edema 08/15/2018   GERD (gastroesophageal reflux disease) 08/15/2018   Gout, joint 08/15/2018   Mixed hyperlipidemia 08/15/2018   PVD (peripheral vascular disease) (HCC) 08/15/2018  Sleep apnea 08/15/2018   Type 2 diabetes mellitus without complication (HCC) 08/15/2018   Vitamin B12 deficiency 08/15/2018   Onychomycosis of multiple toenails with type 2 diabetes mellitus (HCC) 09/28/2017   Morbid obesity (HCC) 08/08/2016   Senile purpura (HCC) 02/08/2016   Abdominal aortic aneurysm (AAA) without rupture (HCC) 02/07/2016   Aortic aneurysm (HCC) 11/04/2015   H/O laryngeal cancer 08/05/2015   Anemia, iron deficiency 03/07/2015   Lumbar radiculitis 08/25/2013   Lumbar spondylosis 08/25/2013   Lumbar  stenosis with neurogenic claudication 08/25/2013   PCP:  Lynnea Ferrier, MD Pharmacy:   [facility] - [place], [place] - [address] [place] [address] Phone: [phone] Fax: [phone]     Social Determinants of Health (SDOH) Social History: SDOH Screenings   Food Insecurity: No Food Insecurity (11/20/2022)  Housing: Low Risk  (11/20/2022)  Transportation Needs: No Transportation Needs (11/20/2022)  Utilities: Not At Risk (11/20/2022)  Financial Resource Strain: Low Risk  (10/09/2022)   Received from [facility]  Tobacco Use: Medium Risk (11/19/2022)   SDOH Interventions:     Readmission Risk Interventions     No data to display

## 2022-11-23 NOTE — Progress Notes (Signed)
PT Cancellation Note  Patient Details Name: KAYCEON AGUINO MRN: [id number] DOB: 04-Aug-1940   Cancelled Treatment:    Reason Eval/Treat Not Completed: Other (comment) (Multiple attempts made this morning, spoke with nurse, patient, and family. Patient feeling weak and requesting therapy return but has now been dry heaving and unable to participate at this time. PT to continue with attempts as patient feeling better.)  Donna Bernard, PT, MPT  Ina Homes 11/23/2022, 10:28 AM

## 2022-11-24 LAB — GLUCOSE, CAPILLARY: Glucose-Capillary: 156 mg/dL — ABNORMAL HIGH (ref 70–99)

## 2022-12-07 ENCOUNTER — Ambulatory Visit: Payer: PPO | Admitting: Podiatry

## 2022-12-10 NOTE — Progress Notes (Signed)
   11/16/2022 0000  Spiritual Encounters  Type of Visit Follow up  Care provided to: Pt and family  Referral source Code page  Reason for visit Code  OnCall Visit Yes   Chaplain responded to code blue which resulted in death of patient. Spiritual support provided to family as compassionate presence, grief support, empathy.  Chaplain spiritual support services remain available as the need arises.

## 2022-12-10 NOTE — Progress Notes (Signed)
CODE BLUE NOTE  Patient Name: MAYKOL SPORN   MRN: [id number]   Date of Birth/ Sex: 1940/07/24 , male      Admission Date: 11/19/2022  Attending Provider: Ernestene Mention, MD  Primary Diagnosis: Chest pain   Cardiopulmonary Resuscitation Directed by: Manuela Schwartz NP, Dr Katrinka Blazing   I personally directed ancillary staff and/or performed CPR in an effort to regain return of spontaneous circulation. ED MD Dr Katrinka Blazing, PCCM BL [place] NP in the room  Indication: Pt was in his usual state of health until this PM, when he was noted to be not breathing and no pulse. Code blue was subsequently called. At the time of arrival on scene, ACLS protocol was underway.    Technical Description:  - CPR performance duration:  25 minutes  - Was defibrillation or cardioversion used? No   - Was external pacer placed? No  - Was patient intubated pre/post CPR? No    Medications Administered: Y = Yes; Blank = No Amiodarone    Atropine    Calcium  1  Epinephrine  3  Lidocaine    Magnesium    Norepinephrine    Phenylephrine    Sodium bicarbonate    Vasopressin   D50              Post CPR evaluation:  - Final Status - Was patient successfully resuscitated ? no - What is current rhythm? asystole - What is current hemodynamic status? na   Miscellaneous Information:  - Labs sent, including: no  - Primary team notified?  Yes  - Family Notified? Yes  - Additional notes/ transfer status:   Dr Katrinka Blazing , BL Andrey Farmer NP in attendence with me. Family contacted by Dr Katrinka Blazing via phone while code in progress and aughter at bedside. Efforts stopped per Wife request. Time of death called by Dr Katrinka Blazing at [facility] am November 24, 2022. Daughter present at time of death called. Chaplain also present    ACLS H's and T's  -Hypovolemia  -Hypoxia -Hydrogen Ion excess (acidosis) -Hypoglycemia -Hypokalemia / Hyperkalemia  -Hypothermia  -Tension pneumothorax  -Toxins  -Thrombosis PE / MI  Donnie Mesa NP [facility] Cross Cover 7pm-7am - check amion for availability Pager [phone]

## 2022-12-10 NOTE — Discharge Summary (Addendum)
Death Summary   Patient: Jesse Allison MRN: [id number] DOB: Nov 20, 1940  Admit date:     11/19/2022  Discharge date: 11/30/2022  Discharge Physician: Ernestene Mention   PCP: Lynnea Ferrier, MD   Discharge Diagnoses: Active Problems:   Essential hypertension   Dyslipidemia   Hypothyroidism   Gout   GERD without esophagitis   Cardiopulmonary arrest (HCC)  Principal Problem (Resolved):   Chest pain Resolved Problems:   Paroxysmal atrial fibrillation with RVR (HCC)   Acute HFrEF (heart failure with reduced ejection fraction) [facility])  Hospital Course: Taken from H&P.  Jesse Allison is a 82 y.o. Caucasian male with medical history significant for coronary artery disease status post three-vessel CABG, COPD, GERD, hypertension, dyslipidemia and hypothyroidism, presented to the emergency room with acute onset of chest pain graded 5/10 in severity and felt as pressure between shoulder blades with associated diaphoresis, jitteriness and weakness.  The patient admitted to nausea without vomiting.   Patient was tachycardic on presentation, EKG shows A-fib with RVR, incomplete right bundle branch block, anteroseptal and inferior Q waves. CTA chest with no PE or acute cardiopulmonary disease.  Patient had recurrent atrial fibrillation overnight.  Received Cardizem bolus followed by Cardizem infusion.  Troponin trending up so he was started on heparin infusion.  Cardiology was consulted.  8/12: Vitals and labs stable, troponin peaked at 656, likely secondary to demand ischemia with going in and out of A-fib with RVR, BNP 120.  Echocardiogram pending. Cardiology started amiodarone infusion.  8/13: Patient with recurrent in and out of A-fib with RVR, becoming significantly bradycardic in between, concern of tachybradycardia syndrome.  He was transferred to stepdown.  Cardiology is planning pacemaker placement tomorrow morning.  Echocardiogram with EF of 45 to 50%, regional wall motion  abnormalities, dilated cardiomyopathy and grade 1 diastolic dysfunction.    Assessment and Plan: Cardiopulmonary arrest: Code blue flag pulled to inititate code. Patient just had a pacemaker placed on 11/22/22. Was sleeping but daughter noticed his breathing stopped when checked at  2340 on 11/23/22 and called out for help, nurse found him unresponsive and pulseless and started CPR. ACLS protocols were implemented for about 20 minutes.  Epinephrine x 3, calcium provided.  CBG is normal. Pt was bagged by respiratory.  Patient's wife and daughter reported that patient would not want intubation and he would not want further CPR, palpable pulses were noted on the second pulse check.  Ventricular rhythm with a rate of 60 bpm.  Hypotensive blood pressures.  Not breathing independently.   When discussing options with wife over the phone by our CODE blue team, and daughter at the bedside, they also report that he would not want to vasopressors.  Wife reported "let him go.  He would not want this.  Let him go" daughter at the bedside agrees that patient would not want further aggressive interventions.  A magnet was used to disengage his pacemaker.  Then has ventricular/wide-complex rhythm on the ZOLL without palpable pulse.    Time of death was recorded as 12:07 AM on 11/14/2022.  Problems addressed during this hospitalization: Chest pain-resolved as of 11/23/2022: The patient has a history of coronary artery disease status post three-vessel CABG. troponin peaked at 656, likely secondary to demand ischemia with multiple episodes of A-fib with RVR.  Echocardiogram with low EF and regional wall motion abnormalities. Cardiology has been consulted and pt was managed with IV heparin infusion  Acute HFrEF (heart failure with reduced ejection fraction) (HCC)-resolved as  of 11/23/2022 Echocardiogram with EF of 40 to 45%, moderate to severe dilated cardiomyopathy, regional wall motion abnormalities and grade 1  diastolic dysfunction. Continue lasix 40mg  daily  Paroxysmal atrial fibrillation with RVR (HCC)-resolved as of 11/23/2022 Initially responded to Cardizem infusion. Pt then went in and out of A-fib with RVR. Amiodarone infusion was started. Becoming significantly bradycardic in between -concerning for tachybradycardia syndrome. Patient was was then transferred to stepdown and cardiology team performed pacemaker placement (Medtronic Dual Chamber PPM) 11/22/22. Chest x-ray shows appropriate pulse generator and lead positioning without evidence of pneumothorax. Cardiology revaluated on 11/23/22 and recommended to discharge the patient with p.o. amiodarone 200 mg twice daily x 10 days, then 200 mg daily thereafter, continue metoprolol tartrate to 50 mg twice daily, continue Eliquis 5 mg twice daily Per cardiology recommendations, stopped aspirin, and continued clopidogrel + Eliquis and started Keflex 500 mg p.o. twice daily x 10 days for pacemaker pocket infection prophylaxis   Essential hypertension: continue he his antihypertensives.  Dyslipidemia: continue statin therapy.  Hypothyroidism: TSH normal. Continue home Synthroid  Gout: continue allopurinol.  GERD without esophagitis: continue PPI therapy.  Physical Debility: with right leg/hip pain limiting his mobility and PT not able to progress walking.  Xray of Rt hip showed not fractures/dislocation.     Procedures performed: Medtronic Dual Chamber PPM) 11/22/22   Imaging Studies: DG HIP UNILAT WITH PELVIS 2-3 VIEWS RIGHT  Result Date: 11/23/2022 CLINICAL DATA:  Severe right hip pain, status post right hip replacement EXAM: DG HIP (WITH OR WITHOUT PELVIS) 2-3V RIGHT COMPARISON:  None. FINDINGS: Right hip arthroplasty, without evidence of complication. No fracture or dislocation is seen. Visualized bony pelvis appears intact. Aorta bi-iliac stents. IMPRESSION: Right hip arthroplasty, without evidence of complication. Electronically Signed   By:  Charline Bills M.D.   On: 11/23/2022 21:14   US Venous Img Lower Bilateral (DVT)  Result Date: 11/22/2022 CLINICAL DATA:  Left lower extremity pain and swelling EXAM: Bilateral lower Extremity Venous Doppler Ultrasound TECHNIQUE: Gray-scale sonography with compression, as well as color and duplex ultrasound, were performed to evaluate the deep venous system(s) from the level of the common femoral vein through the popliteal and proximal calf veins. COMPARISON:  08/12/2019 FINDINGS: VENOUS Normal compressibility of the common femoral, superficial femoral, and popliteal veins, as well as the visualized calf veins. Visualized portions of profunda femoral vein and great saphenous vein unremarkable. No filling defects to suggest DVT on grayscale or color Doppler imaging. Doppler waveforms show normal direction of venous flow, normal respiratory plasticity and response to augmentation. OTHER None. Limitations: none IMPRESSION: No  lower extremity DVT. Electronically Signed   By: Acquanetta Belling M.D.   On: 11/22/2022 16:47   DG Chest Port 1 View  Result Date: 11/22/2022 CLINICAL DATA:  [id number] Pacemaker [id number] EXAM: PORTABLE CHEST 1 VIEW COMPARISON:  11/13/2022 FINDINGS: Interval placement of left subclavian approach implanted cardiac device with leads projecting over the expected locations of the right atrium and right ventricle. Stable heart size status post sternotomy and CABG. Low lung volumes with elevation of the left hemidiaphragm. Mild left basilar atelectasis. No pneumothorax. IMPRESSION: Interval placement of left subclavian approach implanted cardiac device. No pneumothorax. Electronically Signed   By: Duanne Guess D.O.   On: 11/22/2022 12:42   EP PPM/ICD IMPLANT  Result Date: 11/22/2022 Successful dual-chamber MRI compatible rate responsive pacemaker generator implantation   ECHOCARDIOGRAM COMPLETE  Result Date: 11/20/2022    ECHOCARDIOGRAM REPORT   Patient Name:   TYSEAN FORSHEE  Date of  Exam: 11/20/2022 Medical Rec #:  [id number]        Height:       71.0 in Accession #:    [id number]       Weight:       241.8 lb Date of Birth:  May 15, 1940         BSA:          2.286 m Patient Age:    82 years         BP:           146/85 mmHg Patient Gender: M                HR:           86 bpm. Exam Location:  [facility] Procedure: 2D Echo, Cardiac Doppler, Color Doppler and Strain Analysis Indications:     Atrial Fibrillation I48.91  History:         Patient has prior history of Echocardiogram examinations, most                  recent 07/29/2019. COPD; Risk Factors:Hypertension. AAA.  Sonographer:     Cristela Blue Referring Phys:  [id number] JAN A MANSY Diagnosing Phys: Alwyn Pea MD  Sonographer Comments: Suboptimal apical window. Global longitudinal strain was attempted. IMPRESSIONS  1. Mild ant/apical hypo EF=50%.  2. Left ventricular ejection fraction, by estimation, is 45 to 50%. The left ventricle has mildly decreased function. The left ventricle demonstrates regional wall motion abnormalities (see scoring diagram/findings for description). The left ventricular  internal cavity size was moderately to severely dilated. There is moderate concentric left ventricular hypertrophy. Left ventricular diastolic parameters are consistent with Grade I diastolic dysfunction (impaired relaxation).  3. Right ventricular systolic function is low normal. The right ventricular size is normal. Moderately increased right ventricular wall thickness.  4. The mitral valve is normal in structure. Trivial mitral valve regurgitation.  5. The aortic valve is normal in structure. Aortic valve regurgitation is not visualized. Aortic valve sclerosis is present, with no evidence of aortic valve stenosis. FINDINGS  Left Ventricle: Left ventricular ejection fraction, by estimation, is 45 to 50%. The left ventricle has mildly decreased function. The left ventricle demonstrates regional wall motion abnormalities. The left ventricular internal  cavity size was moderately to severely dilated. There is moderate concentric left ventricular hypertrophy. Left ventricular diastolic parameters are consistent with Grade I diastolic dysfunction (impaired relaxation). Right Ventricle: The right ventricular size is normal. Moderately increased right ventricular wall thickness. Right ventricular systolic function is low normal. Left Atrium: Left atrial size was normal in size. Right Atrium: Right atrial size was normal in size. Pericardium: There is no evidence of pericardial effusion. Mitral Valve: The mitral valve is normal in structure. Trivial mitral valve regurgitation. Tricuspid Valve: The tricuspid valve is normal in structure. Tricuspid valve regurgitation is trivial. Aortic Valve: The aortic valve is normal in structure. Aortic valve regurgitation is not visualized. Aortic valve sclerosis is present, with no evidence of aortic valve stenosis. Aortic valve mean gradient measures 2.0 mmHg. Aortic valve peak gradient measures 3.4 mmHg. Aortic valve area, by VTI measures 2.32 cm. Pulmonic Valve: The pulmonic valve was normal in structure. Pulmonic valve regurgitation is not visualized. Aorta: The ascending aorta was not well visualized. IAS/Shunts: No atrial level shunt detected by color flow Doppler. Additional Comments: Mild ant/apical hypo EF=50%.  LEFT VENTRICLE PLAX 2D LVIDd:         5.80 cm  Diastology LVIDs:         4.50 cm      LV e' medial:    4.03 cm/s LV PW:         1.40 cm      LV E/e' medial:  15.1 LV IVS:        1.40 cm      LV e' lateral:   12.50 cm/s LVOT diam:     2.10 cm      LV E/e' lateral: 4.9 LV SV:         55 LV SV Index:   24 LVOT Area:     3.46 cm  LV Volumes (MOD) LV vol d, MOD A4C: 157.0 ml LV vol s, MOD A4C: 77.4 ml LV SV MOD A4C:     157.0 ml RIGHT VENTRICLE RV Basal diam:  4.30 cm RV Mid diam:    3.10 cm RV S prime:     8.59 cm/s TAPSE (M-mode): 2.7 cm LEFT ATRIUM           Index        RIGHT ATRIUM           Index LA diam:       3.50 cm 1.53 cm/m   RA Area:     23.30 cm LA Vol (A4C): 67.2 ml 29.40 ml/m  RA Volume:   61.10 ml  26.73 ml/m  AORTIC VALVE AV Area (Vmax):    1.99 cm AV Area (Vmean):   2.01 cm AV Area (VTI):     2.32 cm AV Vmax:           91.60 cm/s AV Vmean:          69.000 cm/s AV VTI:            0.236 m AV Peak Grad:      3.4 mmHg AV Mean Grad:      2.0 mmHg LVOT Vmax:         52.70 cm/s LVOT Vmean:        40.000 cm/s LVOT VTI:          0.158 m LVOT/AV VTI ratio: 0.67  AORTA Ao Root diam: 3.70 cm MITRAL VALVE               TRICUSPID VALVE MV Area (PHT): 3.06 cm    TR Peak grad:   13.5 mmHg MV Decel Time: 248 msec    TR Vmax:        184.00 cm/s MV E velocity: 60.80 cm/s MV A velocity: 96.90 cm/s  SHUNTS MV E/A ratio:  0.63        Systemic VTI:  0.16 m                            Systemic Diam: 2.10 cm Alwyn Pea MD Electronically signed by Alwyn Pea MD Signature Date/Time: 11/20/2022/2:06:31 PM    Final    CT Angio Chest PE W and/or Wo Contrast  Result Date: 11/19/2022 CLINICAL DATA:  Chest pain, evaluate for PE EXAM: CT ANGIOGRAPHY CHEST WITH CONTRAST TECHNIQUE: Multidetector CT imaging of the chest was performed using the standard protocol during bolus administration of intravenous contrast. Multiplanar CT image reconstructions and MIPs were obtained to evaluate the vascular anatomy. RADIATION DOSE REDUCTION: This exam was performed according to the departmental dose-optimization program which includes automated exposure control, adjustment of the mA and/or kV according to patient size and/or use of  iterative reconstruction technique. CONTRAST:  75mL OMNIPAQUE IOHEXOL 350 MG/ML SOLN COMPARISON:  Chest radiograph dated 11/13/2022. FINDINGS: Cardiovascular: Satisfactory opacification of the bilateral pulmonary arteries to the segmental level. Bilateral lower lobe pulmonary arteries are mildly motion degraded. Within that constraint, there is no evidence of pulmonary embolism. Study is not tailored for  evaluation of the thoracic aorta. No evidence of thoracic aortic aneurysm. Atherosclerotic calcifications of the arch. Mild cardiomegaly. No pericardial effusion. Postsurgical changes related to prior CABG. Severe three-vessel coronary atherosclerosis. Mediastinum/Nodes: No suspicious mediastinal lymphadenopathy. Visualized thyroid is unremarkable. Lungs/Pleura: Evaluation of the lung parenchyma is constrained by respiratory motion, particularly within the bilateral lower lobes. Within that constraint, there are no suspicious pulmonary nodules. Mild scarring/atelectasis in the lingula and bilateral lower lobes. Associated eventration of the left hemidiaphragm. No focal consolidation. No pleural effusion or pneumothorax. Upper Abdomen: Is notable for an aortic stent, vascular calcifications, and multiple simple left renal cysts measuring up to 4.5 cm (series 4/image 153). No follow-up is recommended. Musculoskeletal: Degenerative changes of the visualized thoracolumbar spine. Median sternotomy. Review of the MIP images confirms the above findings. IMPRESSION: No evidence of pulmonary embolism. No acute cardiopulmonary disease. Aortic Atherosclerosis (ICD10-I70.0). Electronically Signed   By: Charline Bills M.D.   On: 11/19/2022 21:52   DG Chest 2 View  Result Date: 11/13/2022 CLINICAL DATA:  svt EXAM: CHEST - 2 VIEW COMPARISON:  Chest x-ray 10/28/2010 FINDINGS: The heart and mediastinal contours are unchanged. Elevated left hemidiaphragm with no definite hiatal hernia. Left base atelectasis. No focal consolidation. No pulmonary edema. No pleural effusion. No pneumothorax. No acute osseous abnormality.  Sternotomy wires are intact. IMPRESSION: No active cardiopulmonary disease. Electronically Signed   By: Tish Frederickson M.D.   On: 11/13/2022 18:56    Microbiology: Results for orders placed or performed during the hospital encounter of 11/19/22  MRSA Next Gen by PCR, Nasal     Status: None   Collection  Time: 11/21/22  5:00 AM   Specimen: Nasal Mucosa; Nasal Swab  Result Value Ref Range Status   MRSA by PCR Next Gen NOT DETECTED NOT DETECTED Final    Comment: (NOTE) The GeneXpert MRSA Assay (FDA approved for NASAL specimens only), is one component of a comprehensive MRSA colonization surveillance program. It is not intended to diagnose MRSA infection nor to guide or monitor treatment for MRSA infections. Test performance is not FDA approved in patients less than 13 years old. Performed at [facility], [address], [place], [place] [address]   Surgical PCR screen     Status: Abnormal   Collection Time: 11/22/22  5:47 AM   Specimen: Nasal Mucosa; Nasal Swab  Result Value Ref Range Status   MRSA, PCR NEGATIVE NEGATIVE Final   Staphylococcus aureus POSITIVE (A) NEGATIVE Final    Comment: (NOTE) The Xpert SA Assay (FDA approved for NASAL specimens in patients 88 years of age and older), is one component of a comprehensive surveillance program. It is not intended to diagnose infection nor to guide or monitor treatment. Performed at [facility], [address] [place], [place] [address]     Labs: CBC: Recent Labs  Lab 11/19/22 1952 11/20/22 0426 11/21/22 0431 11/22/22 0459 11/23/22 0617  WBC 9.2 7.7 9.1 8.1 8.4  NEUTROABS 6.3  --   --   --   --   HGB 13.7 13.3 13.0 12.6* 12.6*  HCT 42.0 42.2 39.9 38.2* 39.6  MCV 93.1 94.4 91.5 90.1 95.4  PLT 201 183 177  157 161   Basic Metabolic Panel: Recent Labs  Lab 11/19/22 1952 11/19/22 2147 11/20/22 0426 11/21/22 0820 11/21/22 2037 11/22/22 0459 11/23/22 0617  NA 140  --  142 137  --  138 138  K 4.0  --  4.1 3.9  --  3.5 4.6  CL 102  --  106 103  --  104 104  CO2 28  --  28 27  --  28 29  GLUCOSE 134*  --  114* 130*  --  122* 117*  BUN 28*  --  24* 21  --  19 24*  CREATININE 1.11  --  0.98 1.01  --  0.99 1.26*  CALCIUM 9.0  --  8.7* 8.6*  --  8.5* 8.5*  MG  --  2.0  --   --  2.1 2.2  --     Liver Function Tests: Recent Labs  Lab 11/19/22 1952  AST 14*  ALT 13  ALKPHOS 70  BILITOT 0.8  PROT 7.1  ALBUMIN 3.9   CBG: Recent Labs  Lab 11/21/22 0450 11/23/22 2351  GLUCAP 138* 156*    Time spent for this summary: 25 minutes.  Signed: Ernestene Mention, MD [facility] 11/16/2022

## 2022-12-10 NOTE — Progress Notes (Addendum)
Daughter at bedside and called for pt evaluation. Prior pt had been awake and verbalizing pain and discomfort with bed positioning. Pain medication was given and slept but unable to swallow his meds. Daughter called for check on patient at 2340 Pt found to have no respiration. Has a pacemaker but no pulpaple pulse. Code blue flag pulled to inititate code.Code continued for about 25 minutes.Family declined intubation and code was stopped at 0007. Pronounced dead at 0007 by Dr Delton [place]. 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

## 2022-12-10 NOTE — ED Provider Notes (Signed)
[facility] Department of Emergency Medicine   Code Blue CONSULT NOTE  Chief Complaint: Cardiac arrest/unresponsive   Level V Caveat: Unresponsive  History of present illness: I was contacted by the hospital for a CODE BLUE cardiac arrest upstairs and presented to the patient's bedside.   CPR in progress, daughter at the bedside.  Patient just had a pacemaker placed.  Was sleeping but daughter noticed his breathing stopped and called out for help, nurse found him unresponsive and pulseless and started CPR.  We performed ACLS protocols for about 20 minutes.  Epinephrine x 3, calcium provided.  CBG is normal.  Being bagged by respiratory.  I discussed with daughter at the bedside as well as patient's wife over the phone plan of care.  They report that patient would not want intubation.  And as the wife is telling me that he would not want further CPR, palpable pulses were noted on the second pulse check.  Ventricular rhythm with a rate of 60 bpm.  Hypotensive blood pressures.  Not breathing independently.  When discussing options with wife over the phone, and daughter at the bedside, they also report that he would not want to vasopressors.  Wife reports "let him go.  He would not want this.  Let him go" daughter at the bedside agrees that patient would not want further aggressive interventions.   We use a magnet to disengage his pacemaker.  Then has ventricular/wide-complex rhythm on the ZOLL without palpable pulse.   Time of death 12:07 AM.  Chaplain at the bedside.  ROS: Unable to obtain, Level V caveat  Scheduled Meds:  allopurinol  300 mg Oral Daily   amiodarone  200 mg Oral BID   Followed by   Melene Muller ON 12/02/2022] amiodarone  200 mg Oral Daily   amLODipine  5 mg Oral Daily   apixaban  5 mg Oral BID   cephALEXin  500 mg Oral Q12H   Chlorhexidine Gluconate Cloth  6 each Topical Daily   clopidogrel  75 mg Oral Daily   cyanocobalamin  1,000 mcg Oral QPC lunch    doxazosin  2 mg Oral QHS   ferrous sulfate  325 mg Oral Q lunch   furosemide  40 mg Oral Daily   levothyroxine  75 mcg Oral Q0600   losartan  100 mg Oral Daily   metoprolol tartrate  50 mg Oral BID   mupirocin ointment  1 Application Nasal BID   pantoprazole  40 mg Oral QAC supper   Continuous Infusions:  sodium chloride Stopped (11/20/22 1901)   PRN Meds:.acetaminophen **OR** acetaminophen, acetaminophen, hydrALAZINE, HYDROcodone-acetaminophen, ipratropium-albuterol, magnesium hydroxide, morphine injection, Muscle Rub, nitroGLYCERIN, ondansetron **OR** ondansetron (ZOFRAN) IV, ondansetron (ZOFRAN) IV, mouth rinse, polyvinyl alcohol, traZODone Past Medical History:  Diagnosis Date   AAA (abdominal aortic aneurysm) (HCC)    s/p EVAR in 2008   Anemia    iron deficiency   Anxiety    Aortic atherosclerosis (HCC)    Arthritis    Chronic cough    since teeanage years   Complication of anesthesia    woke up during AAA repair. Had an epidural and does NOT want again.   COPD (chronic obstructive pulmonary disease) (HCC)    Coronary artery disease    Diverticulosis    Dyspnea    GERD (gastroesophageal reflux disease)    Gout    Hx of CABG 09/1991   Hx of laryngeal cancer 2015   s/p surgery and XRT   Hypercholesterolemia  Hypertension    Hypothyroidism    Myocardial infarction (HCC) 1993   Obesity (BMI 30.0-34.9) 08/11/2019   PONV (postoperative nausea and vomiting)    Several times, vomitting for hours after surgery.   PVD (peripheral vascular disease) (HCC)    Sleep apnea    Spinal stenosis    TIA (transient ischemic attack)    Past Surgical History:  Procedure Laterality Date   ABDOMINAL AORTIC ANEURYSM REPAIR  2010   no longer followed on a regular basis   BACK SURGERY  2000   L4 . piece of vertebrae broke off and lodged into spine. had to remove the loose piece.   CARDIAC CATHETERIZATION     CATARACT EXTRACTION W/PHACO Left 12/12/2016   Procedure: CATARACT  EXTRACTION PHACO AND INTRAOCULAR LENS PLACEMENT (IOC);  Surgeon: Galen Manila, MD;  Location: [facility];  Service: Ophthalmology;  Laterality: Left;  [place] 00:38.1 AP% 14.1 CDE 5.38 FLUID PACK LOT # [id number]   CATARACT EXTRACTION W/PHACO Right 12/26/2016   Procedure: CATARACT EXTRACTION PHACO AND INTRAOCULAR LENS PLACEMENT (IOC);  Surgeon: Galen Manila, MD;  Location: [facility];  Service: Ophthalmology;  Laterality: Right;  [place] 00:53.7 AP% 12.9 CDE 6.92 Fluid Pack Lot # [id number]   COLONOSCOPY WITH PROPOFOL N/A 06/22/2020   Procedure: COLONOSCOPY WITH PROPOFOL;  Surgeon: Regis Bill, MD;  Location: [facility];  Service: Endoscopy;  Laterality: N/A;   CORONARY ARTERY BYPASS GRAFT  1993   EYE SURGERY Bilateral    LARYNGOSCOPY  2015   tumor removal followed by radiation treatments.   PACEMAKER IMPLANT N/A 11/22/2022   Procedure: PACEMAKER IMPLANT;  Surgeon: Marcina Millard, MD;  Location: [facility];  Service: Cardiovascular;  Laterality: N/A;   ROBOTIC ASSISTED LAPAROSCOPIC LYSIS OF ADHESION  07/15/2020   Procedure: XI ROBOTIC ASSISTED LAPAROSCOPIC LYSIS OF ADHESION;  Surgeon: Sung Amabile, DO;  Location: [facility];  Service: General;;   TESTICLAR CYST EXCISION     TOTAL HIP ARTHROPLASTY Right 08/21/2019   Procedure: TOTAL HIP ARTHROPLASTY ANTERIOR APPROACH;  Surgeon: Kennedy Bucker, MD;  Location: [facility];  Service: Orthopedics;  Laterality: Right;   Social History   Socioeconomic History   Marital status: Married    Spouse name: Andrey Campanile   Number of children: Not on file   Years of education: Not on file   Highest education level: Not on file  Occupational History   Occupation: Insurance account manager in [employer]    Comment: retired  Tobacco Use   Smoking status: Former    Current packs/day: 0.00    Types: Cigarettes    Quit date: 2010    Years since quitting: 14.6   Smokeless tobacco: Never  Vaping Use   Vaping status: Never Used  Substance and Sexual Activity    Alcohol use: Yes    Comment: couple of glasses of wine per day.   Drug use: No   Sexual activity: Not on file  Other Topics Concern   Not on file  Social History Narrative   Lives in home with his wife.   Social Determinants of Health   Financial Resource Strain: Low Risk  (10/09/2022)   Received from [facility]   Overall Financial Resource Strain (CARDIA)    Difficulty of Paying Living Expenses: Not hard at all  Food Insecurity: No Food Insecurity (11/20/2022)   Hunger Vital Sign    Worried About Running Out of Food in the Last Year: Never true    Ran Out of Food in the Last Year:  Never true  Transportation Needs: No Transportation Needs (11/20/2022)   PRAPARE - Administrator, Civil Service (Medical): No    Lack of Transportation (Non-Medical): No  Physical Activity: Not on file  Stress: Not on file  Social Connections: Not on file  Intimate Partner Violence: Not At Risk (11/20/2022)   Humiliation, Afraid, Rape, and Kick questionnaire    Fear of Current or Ex-Partner: No    Emotionally Abused: No    Physically Abused: No    Sexually Abused: No   Allergies  Allergen Reactions   Niacin Other (See Comments)    Leg cramps   Oxycodone Other (See Comments)    Makes him loopy and out of control.  Does not like the sensation.    Prednisone Other (See Comments)    Severe pain D/T stopping prednisone immediately rather than weaning it.     Last set of Vital Signs (not current) Vitals:   11/23/22 1212 11/23/22 2028  BP: (!) 123/59 101/69  Pulse: 63 92  Resp: 16 20  Temp: (!) 97.5 F (36.4 C) 97.7 F (36.5 C)  SpO2: 91% 92%      Physical Exam  Gen: unresponsive Cardiovascular: pulseless  Resp: apneic. Breath sounds equal bilaterally with bagging  Abd: nondistended  Neuro: GCS 3, unresponsive to pain  HEENT: No blood in posterior pharynx, gag reflex absent  Neck: No crepitus  Musculoskeletal: No deformity  Skin: warm  Procedures  (when applicable, including Critical Care time): .1-3 Lead EKG Interpretation  Performed by: Delton Prairie, MD Authorized by: Delton Prairie, MD     Interpretation: normal     ECG rate:  60   ECG rate assessment: normal     Rhythm: paced     Ectopy: none     Conduction: normal   .Critical Care  Performed by: Delton Prairie, MD Authorized by: Delton Prairie, MD   Critical care provider statement:    Critical care time (minutes):  30   Critical care time was exclusive of:  Separately billable procedures and treating other patients   Critical care was necessary to treat or prevent imminent or life-threatening deterioration of the following conditions:  Circulatory failure, cardiac failure and respiratory failure   Critical care was time spent personally by me on the following activities:  Development of treatment plan with patient or surrogate, discussions with consultants, evaluation of patient's response to treatment, examination of patient, ordering and review of laboratory studies, ordering and review of radiographic studies, ordering and performing treatments and interventions, pulse oximetry, re-evaluation of patient's condition and review of old charts    MDM / Assessment and Plan See above.     Delton Prairie, MD 12/02/2022 [phone]

## 2022-12-10 DEATH — deceased
